# Patient Record
Sex: Female | Born: 1970 | Race: White | Hispanic: No | Smoking: Never smoker
Health system: Southern US, Community
[De-identification: ages and names within clinical notes are randomized; demographics above are authoritative.]

## PROBLEM LIST (undated history)

## (undated) DIAGNOSIS — E039 Hypothyroidism, unspecified: Secondary | ICD-10-CM

## (undated) DIAGNOSIS — I10 Essential (primary) hypertension: Secondary | ICD-10-CM

## (undated) DIAGNOSIS — M1990 Unspecified osteoarthritis, unspecified site: Secondary | ICD-10-CM

## (undated) DIAGNOSIS — U071 COVID-19: Secondary | ICD-10-CM

## (undated) DIAGNOSIS — E079 Disorder of thyroid, unspecified: Secondary | ICD-10-CM

## (undated) DIAGNOSIS — Z87442 Personal history of urinary calculi: Secondary | ICD-10-CM

## (undated) DIAGNOSIS — N200 Calculus of kidney: Secondary | ICD-10-CM

## (undated) DIAGNOSIS — N132 Hydronephrosis with renal and ureteral calculous obstruction: Secondary | ICD-10-CM

## (undated) HISTORY — PX: WRIST SURGERY: SHX841

## (undated) HISTORY — PX: CYSTOSCOPY: SUR368

## (undated) HISTORY — PX: FOOT SURGERY: SHX648

## (undated) HISTORY — PX: THYROIDECTOMY: SHX17

## (undated) HISTORY — PX: DILATION AND CURETTAGE OF UTERUS: SHX78

---
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
## 2007-05-25 ENCOUNTER — Emergency Department (HOSPITAL_COMMUNITY): Admission: EM | Admit: 2007-05-25 | Discharge: 2007-05-26 | Payer: Self-pay | Admitting: Emergency Medicine

## 2007-06-02 ENCOUNTER — Ambulatory Visit (HOSPITAL_BASED_OUTPATIENT_CLINIC_OR_DEPARTMENT_OTHER): Admission: RE | Admit: 2007-06-02 | Discharge: 2007-06-02 | Payer: Self-pay | Admitting: Urology

## 2009-12-25 ENCOUNTER — Ambulatory Visit (HOSPITAL_COMMUNITY): Admission: RE | Admit: 2009-12-25 | Discharge: 2009-12-25 | Payer: Self-pay | Admitting: Family Medicine

## 2010-01-12 ENCOUNTER — Ambulatory Visit (HOSPITAL_COMMUNITY): Admission: RE | Admit: 2010-01-12 | Discharge: 2010-01-12 | Payer: Self-pay | Admitting: General Surgery

## 2010-01-14 ENCOUNTER — Ambulatory Visit: Payer: Self-pay | Admitting: Orthopedic Surgery

## 2010-01-14 DIAGNOSIS — M25579 Pain in unspecified ankle and joints of unspecified foot: Secondary | ICD-10-CM | POA: Insufficient documentation

## 2010-01-16 ENCOUNTER — Encounter: Payer: Self-pay | Admitting: Orthopedic Surgery

## 2010-01-21 ENCOUNTER — Ambulatory Visit (HOSPITAL_COMMUNITY): Admission: RE | Admit: 2010-01-21 | Discharge: 2010-01-22 | Payer: Self-pay | Admitting: General Surgery

## 2010-01-21 ENCOUNTER — Encounter (INDEPENDENT_AMBULATORY_CARE_PROVIDER_SITE_OTHER): Payer: Self-pay | Admitting: General Surgery

## 2010-01-27 ENCOUNTER — Encounter
Admission: RE | Admit: 2010-01-27 | Discharge: 2010-04-27 | Payer: Self-pay | Source: Home / Self Care | Attending: Orthopedic Surgery | Admitting: Orthopedic Surgery

## 2010-02-10 ENCOUNTER — Encounter: Payer: Self-pay | Admitting: Orthopedic Surgery

## 2010-02-23 ENCOUNTER — Encounter: Payer: Self-pay | Admitting: Orthopedic Surgery

## 2010-02-24 ENCOUNTER — Ambulatory Visit: Payer: Self-pay | Admitting: Orthopedic Surgery

## 2010-02-24 DIAGNOSIS — S93409A Sprain of unspecified ligament of unspecified ankle, initial encounter: Secondary | ICD-10-CM | POA: Insufficient documentation

## 2010-02-25 ENCOUNTER — Encounter: Payer: Self-pay | Admitting: Orthopedic Surgery

## 2010-03-02 ENCOUNTER — Encounter: Payer: Self-pay | Admitting: Orthopedic Surgery

## 2010-03-04 ENCOUNTER — Ambulatory Visit (HOSPITAL_COMMUNITY)
Admission: RE | Admit: 2010-03-04 | Discharge: 2010-03-04 | Payer: Self-pay | Source: Home / Self Care | Admitting: Orthopedic Surgery

## 2010-03-12 ENCOUNTER — Ambulatory Visit: Payer: Self-pay | Admitting: Orthopedic Surgery

## 2010-03-16 ENCOUNTER — Telehealth: Payer: Self-pay | Admitting: Orthopedic Surgery

## 2010-03-18 ENCOUNTER — Telehealth: Payer: Self-pay | Admitting: Orthopedic Surgery

## 2010-04-13 ENCOUNTER — Encounter: Payer: Self-pay | Admitting: Orthopedic Surgery

## 2010-05-01 NOTE — H&P (Addendum)
  NAME:  Rebecca [name], Rebecca [name]                 ACCOUNT NO.:  [id number]  MEDICAL RECORD NO.:  [id number]          PATIENT TYPE:  AMB  LOCATION:  DAY                           FACILITY:  [facility]  PHYSICIAN:  Tilda Burrow, M.D. DATE OF BIRTH:  [birth year]  DATE OF ADMISSION: DATE OF DISCHARGE:  LH                             HISTORY & PHYSICAL   ADMISSION DIAGNOSIS:  Desire for elective permanent sterilization.  HISTORY OF PRESENT ILLNESS:  This 40 year old female, gravida 2, para 2, LMP mid January, divorced not sexually active at the present time, desires elective permanent sterilization.  She has been seen at [facility], counseled over desired permanent sterilization and we will proceed with tubal ligation.  On at [facility] May 08, 2010, she has been seen in the Health Department, referred to our office to permanent sterilization.  Consents have been signed March 27, 2011.  She understands the irreversibility of the intended permanency of the procedure with no reservations regarding proceeding with sterilization.  PAST MEDICAL HISTORY:  Positive for hypertension and hypothyroidism.  MEDICATIONS: 1. Synthroid 0.125 mg per day. 2. Lisinopril/hydrochlorothiazide 10/12.5 p.o. daily. 3. Micronor contraception daily.  SURGICAL HISTORY:  Thyroidectomy January 22, 2010, with thyroid cyst by Dr. Lovell Sheehan, cesarean section x2, breast surgeries x2 and tympanoplasties x2 in 2003.  PHYSICAL EXAMINATION:  VITAL SIGNS:  Height 5 feet 5 inches, weight 250. GENERAL:  Pupils equal, round and reactive.  Extraocular movements intact. NECK:  Supple.  Normal trachea midline, surgical scar consistent with thyroidectomy. CHEST:  Clear to auscultation. ABDOMEN:  Nontender without masses.  Obesity present. EXTERNAL GENITALIA:  Multiparous.  Uterus mobile, normal size with exam limited by body habitus. EXTREMITIES:  Without cyanosis, clubbing or edema.  ASSESSMENT: 1. Desire for elective  permanent sterilization. 2. History of cesarean section x2. 3. History of thyroidectomy. 4. Chronic hypertension. 5. Obesity.  PLAN:  Laparoscopic tubal sterilization with Falope rings at [facility] on May 08, 2010.     Tilda Burrow, M.D.     JVF/MEDQ  D:  04/29/2010  T:  04/30/2010  Job:  [id number]  cc:   [facility] Department  Electronically Signed by Christin Bach M.D. on 05/01/2010 11:44:28 AM

## 2010-05-05 NOTE — Assessment & Plan Note (Signed)
Summary: rt ankle pain needs xr ca mcd/rchd/bsf   Vital Signs:  [name] profile:   40 year old female Height:      66 inches Weight:      284 pounds Pulse rate:   64 / minute Resp:     16 per minute  Vitals Entered By: Fuller Canada MD (January 14, 2010 9:22 AM)  Visit Type:  Initial Consult Referring Jalan Fariss:  Health Deartment Primary Damarkus Balis:  Health Department  CC:  right ankle pain.  History of Present Illness: I saw Rebecca [name] in Rebecca office today for an initial visit.  Rebecca [name] is a 40 years old woman with Rebecca complaint of:  right ankle pain.  Xrays today.  DOI 01/10/10.  Meds: Birth Control.  This is a 40 year old female presents to [place] with several month history of medial RIGHT ankle pain after an eversion injury 3 months ago.  Rebecca [name] was walking in some high heels on a cobblestone street and Rebecca foot went out and Rebecca ankle rolled in and since that time Rebecca [name]'s had difficulty on Rebecca medial side of Rebecca ankle joint.  Rebecca [name] did wear a cam walker type boot for several weeks an initial pain subsided but Rebecca [name] continues to have swelling and pain with activity on Rebecca medial side at Rebecca antero medial corner of Rebecca joint.  Rebecca pain is 7/10 it is constant sharp and stabbing it is worse with exercise.  Rebecca [name] has recently been diagnosed with thyroid cancer and is scheduled for surgery.  Rebecca [name] is also trying to go back to school after splitting up with her husband.  Rebecca [name] has gained a significant amount of weight secondary to her thyroid disease and her most recent pregnancy and delivery.  Rebecca health department is asked [place] to be involved with consultation.  Rebecca [name] is a recreational runner and would like to pursue that again to lose weight.    Allergies (verified): No Known Drug Allergies  Past History:  Past Medical History: htn thyroid  Past Surgical History: 2 left wrist 2 c sections 2 ear  Family History: na  Social History: [name] is separated.  full time student no  smoking no alcohol nocaffeien associates degree  Review of Systems Constitutional:  Denies weight loss, weight gain, fever, chills, and fatigue. Cardiovascular:  Denies chest pain, palpitations, fainting, and murmurs. Respiratory:  Denies short of breath, wheezing, couch, tightness, pain on inspiration, and snoring . Gastrointestinal:  Denies heartburn, nausea, vomiting, diarrhea, constipation, and blood in your stools. Genitourinary:  Denies frequency, urgency, difficulty urinating, painful urination, flank pain, and bleeding in urine. Neurologic:  Denies numbness, tingling, unsteady gait, dizziness, tremors, and seizure. Musculoskeletal:  Denies joint pain, swelling, instability, stiffness, redness, heat, and muscle pain. Endocrine:  Denies excessive thirst, exessive urination, and heat or cold intolerance. Psychiatric:  Denies nervousness, depression, anxiety, and hallucinations. Skin:  Denies changes in Rebecca skin, poor healing, rash, itching, and redness. HEENT:  Denies blurred or double vision, eye pain, redness, and watering. Immunology:  Denies seasonal allergies, sinus problems, and allergic to bee stings. Hemoatologic:  Denies easy bleeding and brusing.  Physical Exam  Additional Exam:  Rather obese white female who has vital signs have been recorded in stable.  Rebecca [name] is otherwise well-developed and nourished woman hygiene or excellent  Rebecca [name] has no abnormalities on observation of her cardiovascular system related to her lower extremities and palpation of pulses are equal and normal.  Her skin is warm dry and intact no rash or lesion  Rebecca [name] has normal sensation in Rebecca RIGHT foot and ankle.  Rebecca [name] is awake alert and oriented x3 her mood and affect are normal  Rebecca [name] ambulates with what appears to be a normal gait.  Rebecca [name] has no tenderness in Rebecca ankle Achilles or heel area except at Rebecca anteromedial corner of Rebecca joint and over Rebecca talus and tibia distally.  Rebecca [name] has normal range of motion  good strength ankle is stable     Impression & Recommendations:  Problem # 1:  PAIN IN JOINT, ANKLE AND FOOT (ICD-719.47) Assessment New  x-rays were taken 3 views RIGHT ankle  I did not see any abnormality on x-ray.  Rebecca joint looks good there is no subluxation of Rebecca talus there is no evidence of chondral lesion or osteochondral fracture.  Impression ankle joint pain.  Rec  I would like to get an MRI of her ankle but Rebecca [name] has Medicaid and we will have to put on anti-inflammatories, start physical therapy first and then if no improvement after 6 weeks proceed  Orders: New [name] Level III (65784) Ankle x-ray complete,  minimum 3 views (69629)  [name] Instructions: 1)  PHYSICAL THERAPY x 6 weeks  2)  Over Rebecca counter NSAID Ibubrofen or Aleve  3)  return in 6 weeks

## 2010-05-05 NOTE — Letter (Signed)
Summary: History form  History form   Imported By: Jacklynn Ganong 01/16/2010 08:35:07  _____________________________________________________________________  External Attachment:    Type:   Image     Comment:   External Document

## 2010-05-05 NOTE — Assessment & Plan Note (Signed)
Summary: REVIEW MRI RESULTS/RT ANKLE/[facility]/[place] MEDICA[place]/CAF   Visit Type:  Follow-up Referring Provider:  Health Deartment Primary Provider:  Health Department  CC:  right ankle pain.  History of Present Illness: I saw Rebecca [name] in the office today for a followup visit.  She is a 40 years old woman with the complaint of:  right ankle  Patient states her ankle is the same.  MRI results: Her MRI is normal   MEDS: ALEVE  Previous treatments included bracing, physical therapy. She noted and physical therapy, that she had difficulty training on the proprioception work.  She still has significant pain in the front of the ankle with a clicking and popping which is preventing her from any extensive walking, and, definitely preventing her from running. She is very anxious to workout and to continue her exercise program, but is not able to because of ankle pain.      Allergies: No Known Drug Allergies   Impression & Recommendations:  Problem # 1:  ANKLE SPRAIN (ICD-845.00) Assessment Comment Only  The MRI was done at [facility] and it was reviewed with the report  IMPRESSION:   1.  No acute bony findings for significant degenerative changes. 2.  Mild edema and synovial thickening in the sinus tarsi. 3.  Intact medial and lateral ankle ligaments and tendons. 4.  Intact Achilles tendon, plantar fascia and anterior ankle tendons.   Read By:  Cyndie Chime,  M.D.      Orders: Orthopedic Surgeon Referral (Ortho Surgeon) Est. Patient Level II [phone])  Problem # 2:  PAIN IN JOINT, ANKLE AND FOOT (ICD-719.47) Assessment: Comment Only  We would advise her shoe start Aleve twice a day for a period of 6 weeks whether she is having discomfort or not.  We also advised a consult with the foot and ankle specialist  Orders: Orthopedic Surgeon Referral (Ortho Surgeon) Est. Patient Level II [phone])  Patient Instructions: 1)  2nd opinion foot and ankle specialist  2)  we will call you  and let you know of your appt. 3)  Take Aleve 1 tablet twice a day for 6 weeks   Orders Added: 1)  Orthopedic Surgeon Referral [Ortho Surgeon] 2)  Est. Patient Level II [14782]

## 2010-05-05 NOTE — Miscellaneous (Signed)
Summary: PT progress note  PT progress note   Imported By: Jacklynn Ganong 02/25/2010 07:53:47  _____________________________________________________________________  External Attachment:    Type:   Image     Comment:   External Document

## 2010-05-05 NOTE — Miscellaneous (Signed)
Summary: mri [facility] 03/04/10 reg 930am  Clinical Lists Changes  precert number [id number] expires 04/01/10 for [place] Medicaid, LMOM for pt to call here for mri result appt, needs to bring disc. 03/03/10 Called back to patient, reached, confirmed MRI appt for 03/04/10, 9:30 register, and scheduled follow up appointment.

## 2010-05-05 NOTE — Assessment & Plan Note (Signed)
Summary: 6 WK RE-CK RT ANKLE FOL'G PT/CA MCD/CAF   Visit Type:  Follow-up Referring Provider:  Health Deartment Primary Provider:  Health Department  CC:  recheck ankle.  History of Present Illness: This is a followup visit for this 40 year old female, who has had pain over the medial side of her RIGHT ankle over the medial malleolus after an inversion injury. Over 4 months ago. She was walking on [place]. Her foot went out from under her ankle rolled and since that, time, she's had medial ankle pain, which has not responded to physical therapy or anti-inflammatories. DOI 01/10/10.  Meds: Birth Control, Levothyroxine, Aleve some relief.  system review. Patient recently had her thyroid removed. That went well. She is doing well from that.  Today is 6 week recheck after PT, report for review and to be signed from 02/09/10.       Allergies (verified): No Known Drug Allergies  Past History:  Past Surgical History: 2 left wrist 2 c sections 2 ear thyroid removed 2011  Physical Exam  Additional Exam:  repeat examination.  She still has pain primarily over the medial malleolus and the soft tissues are nontender. There is some pain in the anterior ankle joint at the distal tibia. Her range of motion is normal. Her ankle remains stable. There is no muscle strength deficit. Sensation is normal and pulse and perfusion are also normal   Impression & Recommendations:  Problem # 1:  PAIN IN JOINT, ANKLE AND FOOT (ICD-719.47)  Orders: Est. Patient Level III (09604)  Problem # 2:  ANKLE SPRAIN (ICD-845.00) Assessment: Comment Only  Seems like he he has an anterior impingement lesion and perhaps a bone contusion of the medial malleolus., also could have talar dome osteochondral lesion.  Recommend MRI to evaluate the bone contusion versus osteochondral medial talar dome lesion  Orders: Est. Patient Level III (54098)  Patient Instructions: 1)  STOP PHYSICAL THERAPY    2)  MRI RIGHT ANKLE RETURN FOR RESULTS    Orders Added: 1)  Est. Patient Level III [11914]

## 2010-05-05 NOTE — Letter (Signed)
Summary: Insurance authorization for [employer] authorization for MRI   Imported By: Jacklynn Ganong 03/03/2010 08:39:46  _____________________________________________________________________  External Attachment:    Type:   Image     Comment:   External Document

## 2010-05-05 NOTE — Miscellaneous (Signed)
Summary: PT initial evaluation  PT initial evaluation   Imported By: Jacklynn Ganong 02/10/2010 17:00:32  _____________________________________________________________________  External Attachment:    Type:   Image     Comment:   External Document

## 2010-05-07 NOTE — Progress Notes (Signed)
Summary: Appointment scheduled w/Dr Lestine Box  Phone Note From Other Clinic   Caller: Referral Coordinator Summary of Call: Received fax from [facility]/Dr. Lestine Box - appointment has been scheduled 04/13/10 at 9:00AM.  I called patient to notify; states she was also notified by Dr. Lestine Box' referral person.  Patient already has films and aware to bring to appt. Initial call taken by: Cammie Sickle,  March 18, 2010 11:31 AM

## 2010-05-07 NOTE — Consult Note (Signed)
Summary: Consult report from Dr. Leonides Grills  Consult report from Dr. Leonides Grills   Imported By: Jacklynn Ganong 04/20/2010 13:52:47  _____________________________________________________________________  External Attachment:    Type:   Image     Comment:   External Document

## 2010-05-07 NOTE — Progress Notes (Signed)
Summary: Referral to Dr. Lestine Box.  Phone Note Outgoing Call   Call placed by: Waldon Reining,  March 16, 2010 10:03 AM Call placed to: Specialist Action Taken: Information Sent Summary of Call: I faxed a referral for this patient to Dr. Lestine Box to be seen for ankle sprain and pain in joint.

## 2010-05-08 ENCOUNTER — Ambulatory Visit (HOSPITAL_COMMUNITY)
Admission: RE | Admit: 2010-05-08 | Discharge: 2010-05-08 | Payer: Self-pay | Source: Home / Self Care | Attending: Obstetrics and Gynecology | Admitting: Obstetrics and Gynecology

## 2010-06-17 LAB — BASIC METABOLIC PANEL
CO2: 30 mEq/L (ref 19–32)
Chloride: 101 mEq/L (ref 96–112)
Glucose, Bld: 104 mg/dL — ABNORMAL HIGH (ref 70–99)
Potassium: 3.7 mEq/L (ref 3.5–5.1)
Sodium: 141 mEq/L (ref 135–145)

## 2010-06-17 LAB — CBC
HCT: 34.7 % — ABNORMAL LOW (ref 36.0–46.0)
Hemoglobin: 11.6 g/dL — ABNORMAL LOW (ref 12.0–15.0)
MCH: 27.4 pg (ref 26.0–34.0)
MCHC: 33.4 g/dL (ref 30.0–36.0)
MCV: 82 fL (ref 78.0–100.0)

## 2010-06-17 LAB — DIFFERENTIAL
Basophils Relative: 0 % (ref 0–1)
Eosinophils Absolute: 0.2 10*3/uL (ref 0.0–0.7)
Lymphs Abs: 3.1 10*3/uL (ref 0.7–4.0)
Monocytes Absolute: 1.3 10*3/uL — ABNORMAL HIGH (ref 0.1–1.0)
Monocytes Relative: 11 % (ref 3–12)

## 2010-06-17 LAB — ALBUMIN: Albumin: 3.4 g/dL — ABNORMAL LOW (ref 3.5–5.2)

## 2010-06-18 LAB — CBC
HCT: 40.1 % (ref 36.0–46.0)
Hemoglobin: 13.1 g/dL (ref 12.0–15.0)
MCH: 26.8 pg (ref 26.0–34.0)
MCHC: 32.8 g/dL (ref 30.0–36.0)
MCV: 81.9 fL (ref 78.0–100.0)
Platelets: 319 10*3/uL (ref 150–400)
RBC: 4.89 MIL/uL (ref 3.87–5.11)
RDW: 14.7 % (ref 11.5–15.5)
WBC: 8.9 10*3/uL (ref 4.0–10.5)

## 2010-06-18 LAB — BASIC METABOLIC PANEL
BUN: 10 mg/dL (ref 6–23)
CO2: 25 mEq/L (ref 19–32)
Calcium: 10.2 mg/dL (ref 8.4–10.5)
Chloride: 107 mEq/L (ref 96–112)
Creatinine, Ser: 0.86 mg/dL (ref 0.4–1.2)
GFR calc Af Amer: >60 mL/min (ref >60)
GFR calc non Af Amer: >60 mL/min (ref >60)
Glucose, Bld: 107 mg/dL — ABNORMAL HIGH (ref 70–99)
Potassium: 4 mEq/L (ref 3.5–5.1)
Sodium: 141 mEq/L (ref 135–145)

## 2010-06-18 LAB — SURGICAL PCR SCREEN
MRSA, PCR: NEGATIVE
Staphylococcus aureus: POSITIVE — AB

## 2010-06-18 LAB — TSH: TSH: 1.92 u[IU]/mL (ref 0.350–4.500)

## 2010-06-18 LAB — T4: T4, Total: 7 ug/dL (ref 5.0–12.5)

## 2010-08-18 NOTE — Op Note (Signed)
NAME:  Rebecca [name], Rebecca [name]                 ACCOUNT NO.:  [id number]   MEDICAL RECORD NO.:  [id number]          PATIENT TYPE:  AMB   LOCATION:  [facility]                         FACILITY:  [facility]   PHYSICIAN:  [name], M.D.DATE OF BIRTH:  June 22, 1970   DATE OF PROCEDURE:  06/02/2007  DATE OF DISCHARGE:                               OPERATIVE REPORT   PREOPERATIVE DIAGNOSIS:  Large, left distal ureteral stone measuring 9  mm.   POSTOPERATIVE DIAGNOSIS:  Large, left distal ureteral stone measuring 9  mm.   PROCEDURE:  1. Cystoscopy.  2. Left ureteroscopy.  3  Laser fragmentation and stone basketing.   SURGEON:  [name], MD   ANESTHESIA:  General.   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  She has  had previous stones including ureteroscopic procedure for 6 mm stone in  the past.  She was counseled about the risks of infection, hemorrhage,  or ureteral injury and possible double-J catheter.   PROCEDURE IN DETAIL:  The patient was brought to the operating room and  placed in lithotomy position.  External genitalia were prepped and  draped in the usual fashion.  She was cystoscoped and bladder was  unremarkable.  Fluoroscopy revealed a stone at the mid distal bony  pelvic ureter.  A guide wire was placed up the ureter and passed the  left ureteral stone without difficulty.  The inner sheath of an ureteral  access sheath was used to dilate the intramural ureter.  A 6-French  ureteroscope was then easily passed up to the stone, which was an  unusual whitish color.  I used the holmium laser to break the stone into  4 pieces, all of which were retrieved with the Nitinol stone basket and  later given to the patient.  At this point the ureteroscope was  reinserted and there was no evidence of any significant ureteral injury  that warranted placement of a double-J catheter.  Therefore the  bladder  was emptied, all scopes removed, and the patient taken to the recovery  room in good condition, having tolerated the procedure well.      [name], M.D.  Electronically Signed     LJP/MEDQ  D:  06/02/2007  T:  06/03/2007  Job:  [id number]

## 2010-12-28 LAB — BASIC METABOLIC PANEL
Calcium: 9
GFR calc Af Amer: 53 — ABNORMAL LOW
GFR calc non Af Amer: 44 — ABNORMAL LOW
Glucose, Bld: 120 — ABNORMAL HIGH
Potassium: 4.1
Sodium: 138

## 2010-12-28 LAB — PREGNANCY, URINE: Preg Test, Ur: NEGATIVE

## 2010-12-28 LAB — DIFFERENTIAL
Eosinophils Relative: 1
Lymphocytes Relative: 20
Lymphs Abs: 1.8
Monocytes Absolute: 1.1 — ABNORMAL HIGH
Monocytes Relative: 12
Neutro Abs: 6.2

## 2010-12-28 LAB — URINALYSIS, ROUTINE W REFLEX MICROSCOPIC
Ketones, ur: NEGATIVE
Nitrite: NEGATIVE
Protein, ur: 30 — AB

## 2010-12-28 LAB — CBC
HCT: 40.9
Hemoglobin: 14
RBC: 4.71
RDW: 12.6

## 2012-01-19 ENCOUNTER — Emergency Department (HOSPITAL_BASED_OUTPATIENT_CLINIC_OR_DEPARTMENT_OTHER): Payer: Medicaid Other

## 2012-01-19 ENCOUNTER — Encounter (HOSPITAL_BASED_OUTPATIENT_CLINIC_OR_DEPARTMENT_OTHER): Payer: Self-pay | Admitting: *Deleted

## 2012-01-19 ENCOUNTER — Emergency Department (HOSPITAL_BASED_OUTPATIENT_CLINIC_OR_DEPARTMENT_OTHER)
Admission: EM | Admit: 2012-01-19 | Discharge: 2012-01-19 | Disposition: A | Discharge status: Home / Self Care | Payer: Medicaid Other | Attending: Emergency Medicine | Admitting: Emergency Medicine

## 2012-01-19 DIAGNOSIS — X500XXA Overexertion from strenuous movement or load, initial encounter: Secondary | ICD-10-CM | POA: Insufficient documentation

## 2012-01-19 DIAGNOSIS — Y9301 Activity, walking, marching and hiking: Secondary | ICD-10-CM | POA: Insufficient documentation

## 2012-01-19 DIAGNOSIS — S93409A Sprain of unspecified ligament of unspecified ankle, initial encounter: Secondary | ICD-10-CM | POA: Insufficient documentation

## 2012-01-19 DIAGNOSIS — E079 Disorder of thyroid, unspecified: Secondary | ICD-10-CM | POA: Insufficient documentation

## 2012-01-19 DIAGNOSIS — S93402A Sprain of unspecified ligament of left ankle, initial encounter: Secondary | ICD-10-CM

## 2012-01-19 HISTORY — DX: Calculus of kidney: N20.0

## 2012-01-19 HISTORY — DX: Disorder of thyroid, unspecified: E07.9

## 2012-01-19 MED ORDER — OXYCODONE-ACETAMINOPHEN 5-325 MG PO TABS: 1.0000 | ORAL_TABLET | ORAL | Status: DC | PRN

## 2012-01-19 NOTE — ED Provider Notes (Signed)
History     CSN: [id number]  Arrival date & time 01/19/12  0865   First MD Initiated Contact with Patient 01/19/12 [phone]      Chief Complaint  Patient presents with  . Ankle Pain    (Consider location/radiation/quality/duration/timing/severity/associated sxs/prior treatment) Patient is a 41 y.o. Rebecca [name] presenting with ankle pain. The history is provided by the patient.  Ankle Pain   She twisted her left ankle yesterday while walking. It was an inversion injury. Since then, she complains of pain in the lateral aspect of her ankle. Pain is worse with walking. She rates pain at 3/10 at rest, and 7/10 with walking. She is able to bear weight although it is painful. She denies other injury.  Past Medical History  Diagnosis Date  . Thyroid disease   . Kidney stones     Past Surgical History  Procedure Date  . Thyroidectomy     History reviewed. No pertinent family history.  History  Substance Use Topics  . Smoking status: Not on file  . Smokeless tobacco: Not on file  . Alcohol Use: No    OB History    Grav Para Term Preterm Abortions TAB SAB Ect Mult Living                  Review of Systems  All other systems reviewed and are negative.    Allergies  Review of patient's allergies indicates no known allergies.  Home Medications   Current Outpatient Rx  Name Route Sig Dispense Refill  . LEVOTHYROXINE SODIUM 100 MCG PO TABS Oral Take 100 mcg by mouth daily.      BP 159/99  Pulse 85  Temp 97.8 F (36.6 C) (Oral)  Resp 16  Ht 5' 5.25" (1.657 m)  Wt 260 lb (117.935 kg)  BMI 42.94 kg/m2  SpO2 100%  Physical Exam  Nursing note and vitals reviewed. 41 year old Rebecca [name], resting comfortably and in no acute distress. Vital signs are significant for hypertension with blood pressure 159/99. Oxygen saturation is 100%, which is normal. Head is normocephalic and atraumatic. PERRLA, EOMI. Oropharynx is clear. Neck is nontender and supple without adenopathy or  JVD. Back is nontender and there is no CVA tenderness. Lungs are clear without rales, wheezes, or rhonchi. Chest is nontender. Heart has regular rate and rhythm without murmur. Abdomen is soft, flat, nontender without masses or hepatosplenomegaly and peristalsis is normoactive. Extremities have no cyanosis or edema, full range of motion is present. Left ankle has mild swelling anterolaterally, but no swelling over either malleolus. There is no tenderness to direct palpation. Pain is elicited with passive inversion. There is no tenderness over the fifth metatarsal and there is no tenderness over the proximal fibula. Distal neurovascular exam is intact with strong pulses, prompt capillary refill, and normal sensation. Skin is warm and dry without rash. Neurologic: Mental status is normal, cranial nerves are intact, there are no motor or sensory deficits.   ED Course  Procedures (including critical care time)  Dg Ankle Complete Left  01/19/2012  *RADIOLOGY REPORT*  Clinical Data: Twisted ankle, lateral pain  LEFT ANKLE COMPLETE - 3+ VIEW  Comparison: None.  Findings: Three views of the left ankle submitted.  No acute fracture or subluxation.  No radiopaque foreign body.  Ankle mortise is preserved.  IMPRESSION: No acute fracture or subluxation.  Ankle mortise is preserved.   Original Report Authenticated By: Natasha Mead, M.D.      1. Sprain of ankle, left  MDM  Ankle sprain of left ankle. X-rays will be obtained to rule out fracture.  X-rays are negative for fracture. She will be placed in an ASO to use as needed given crutches to use as needed. Prescriptions given for Percocet to take as needed for pain, but advised to use ibuprofen for less severe pain. She has an established relationship with Dr. Romeo Apple for orthopedic problems, so she is to follow up with him as needed.        Dione Booze, MD 01/19/12 1006

## 2012-01-19 NOTE — ED Notes (Signed)
Twisted ankle yesterday at grocery store having some swelling and pain in left ankle and side of foot

## 2012-05-31 ENCOUNTER — Ambulatory Visit: Payer: Medicaid Other | Admitting: Orthopedic Surgery

## 2012-06-05 ENCOUNTER — Ambulatory Visit: Payer: Medicaid Other | Admitting: Orthopedic Surgery

## 2012-06-06 ENCOUNTER — Encounter: Payer: Self-pay | Admitting: Orthopedic Surgery

## 2012-06-06 ENCOUNTER — Ambulatory Visit: Payer: Medicaid Other | Admitting: Orthopedic Surgery

## 2012-06-20 ENCOUNTER — Ambulatory Visit: Payer: Medicaid Other | Admitting: Orthopedic Surgery

## 2012-06-27 ENCOUNTER — Other Ambulatory Visit: Payer: Self-pay | Admitting: *Deleted

## 2012-06-27 ENCOUNTER — Ambulatory Visit (INDEPENDENT_AMBULATORY_CARE_PROVIDER_SITE_OTHER): Payer: Medicaid Other | Admitting: Orthopedic Surgery

## 2012-06-27 ENCOUNTER — Encounter: Payer: Self-pay | Admitting: Orthopedic Surgery

## 2012-06-27 VITALS — BP 150/88 | Ht 65.0 in | Wt 286.0 lb

## 2012-06-27 DIAGNOSIS — G5600 Carpal tunnel syndrome, unspecified upper limb: Secondary | ICD-10-CM | POA: Insufficient documentation

## 2012-06-27 DIAGNOSIS — G5601 Carpal tunnel syndrome, right upper limb: Secondary | ICD-10-CM

## 2012-06-27 NOTE — Progress Notes (Signed)
Patient ID: Rebecca [name], female   DOB: Jul 15, 1970, 42 y.o.   MRN: [id number] Chief Complaint  Patient presents with  . Hand Pain    Right hand and fingers pain, numbness, and tinling Referred by M. Rozann Lesches, NP    Patient history 42 year-old female presents with a history of hypothyroidism on thyroid medication, no history of diabetes, history of repetitive activity with computer use and 89 year history of carpal tunnellike symptoms in her long and ring finger. Over the last 2 years her symptoms have worsened. She has difficulty sleeping at night. She raises her arm and hand different positions to try to get the feeling back into them. In the mornings her hand is tight she feels numbness and tingling. With any writing or computer use she starts having increased numbness and tingling. She was treated with Neurontin, a brace and anti-inflammatory Aleve and her symptoms have not improved. She is interested in having carpal tunnel surgery.  She has no known drug allergies  Should thyroidectomy  She's had 2 cesarean sections 2 left wrist surgeries 2 tympanoplasty's and a total thyroidectomy  Pharmacy [facility] [address] [place]  [facility]  Family history is negative  Social history divorced  Current employment Loss adjuster, chartered. No smoking. She has a Probation officer.  BP 150/88  Ht 5\' 5"  (1.651 m)  Wt 286 lb (129.729 kg)  BMI 47.59 kg/m2 General appearance is normal, the patient is alert and oriented x3 with normal mood and affect. Endomorphic body habitus BMI 47 Ambulation is normal  Her right hand appears to be swollen compared to the left. Her range of motion is normal at the wrist and small joints of the hand including the fingers. The wrist joint itself is stable her grip strength is normal. Skin is intact without rash. Pulses and perfusion are normal radial ulnar artery with normal Allen's test normal capillary refill. Decreased sensation in the  ring and long finger. Positive carpal tunnel findings include immediate findings on the Phalen's maneuver and tenderness with the carpal tunnel compression test and pain and tenderness in the distal half of the forearm on the volar side  Impression carpal tunnel syndrome right upper extremity  Plan carpal tunnel release.  The risks and benefits of the procedure have been explained to the patient and patient information has been given.  We'll schedule surgery at her earliest convenience

## 2012-06-27 NOTE — Patient Instructions (Addendum)
Right carpal tunnel release   Carpal Tunnel Release Carpal tunnel release is done to relieve the pressure on the nerves and tendons on the bottom side of your wrist.  LET YOUR CAREGIVER KNOW ABOUT:   Allergies to food or medicine.  Medicines taken, including vitamins, herbs, eyedrops, over-the-counter medicines, and creams.  Use of steroids (by mouth or creams).  Previous problems with anesthetics or numbing medicines.  History of bleeding problems or blood clots.  Previous surgery.  Other health problems, including diabetes and kidney problems.  Possibility of pregnancy, if this applies. RISKS AND COMPLICATIONS  Some problems that may happen after this procedure include:  Infection.  Damage to the nerves, arteries or tendons could occur. This would be very uncommon.  Bleeding. BEFORE THE PROCEDURE   This surgery may be done while you are asleep (general anesthetic) or may be done under a block where only your forearm and the surgical area is numb.  If the surgery is done under a block, the numbness will gradually wear off within several hours after surgery. HOME CARE INSTRUCTIONS   Have a responsible person with you for 24 hours.  Do not drive a car or use public transportation for 24 hours.  Only take over-the-counter or prescription medicines for pain, discomfort, or fever as directed by your caregiver. Take them as directed.  You may put ice on the palm side of the affected wrist.  Put ice in a plastic bag.  Place a towel between your skin and the bag.  Leave the ice on for 20 to 30 minutes, 4 times per day.  If you were given a splint to keep your wrist from bending, use it as directed. It is important to wear the splint at night or as directed. Use the splint for as long as you have pain or numbness in your hand, arm, or wrist. This may take 1 to 2 months.  Keep your hand raised (elevated) above the level of your heart as much as possible. This keeps swelling  down and helps with discomfort.  Change bandages (dressings) as directed.  Keep the wound clean and dry. SEEK MEDICAL CARE IF:   You develop pain not relieved with medications.  You develop numbness of your hand.  You develop bleeding from your surgical site.  You have an oral temperature above 102 F (38.9 C).  You develop redness or swelling of the surgical site.  You develop new, unexplained problems. SEEK IMMEDIATE MEDICAL CARE IF:   You develop a rash.  You have difficulty breathing.  You develop any reaction or side effects to medications given. Document Released: 06/12/2003 Document Revised: 06/14/2011 Document Reviewed: 01/26/2007 [facility] Patient Information 2013 [place], [place].

## 2012-07-13 ENCOUNTER — Encounter (HOSPITAL_COMMUNITY): Payer: Self-pay | Admitting: Pharmacy Technician

## 2012-07-17 ENCOUNTER — Telehealth: Payer: Self-pay | Admitting: *Deleted

## 2012-07-17 ENCOUNTER — Encounter (HOSPITAL_COMMUNITY)
Admission: RE | Admit: 2012-07-17 | Discharge: 2012-07-17 | Disposition: A | Discharge status: Home / Self Care | Payer: Medicaid Other | Source: Ambulatory Visit | Attending: Orthopedic Surgery | Admitting: Orthopedic Surgery

## 2012-07-17 ENCOUNTER — Encounter (HOSPITAL_COMMUNITY): Payer: Self-pay

## 2012-07-17 HISTORY — DX: Hypothyroidism, unspecified: E03.9

## 2012-07-17 HISTORY — DX: Essential (primary) hypertension: I10

## 2012-07-17 LAB — BASIC METABOLIC PANEL
Calcium: 9.1 mg/dL (ref 8.4–10.5)
Creatinine, Ser: 0.92 mg/dL (ref 0.50–1.10)
GFR calc non Af Amer: 77 mL/min — ABNORMAL LOW (ref >90)
Glucose, Bld: 73 mg/dL (ref 70–99)
Sodium: 139 mEq/L (ref 135–145)

## 2012-07-17 LAB — HEMOGLOBIN AND HEMATOCRIT, BLOOD: Hemoglobin: 15.1 g/dL — ABNORMAL HIGH (ref 12.0–15.0)

## 2012-07-17 LAB — HCG, SERUM, QUALITATIVE: Preg, Serum: NEGATIVE

## 2012-07-17 LAB — SURGICAL PCR SCREEN: Staphylococcus aureus: NEGATIVE

## 2012-07-17 NOTE — Patient Instructions (Addendum)
Rebecca [name]  07/17/2012   Your procedure is scheduled on:  07/24/2012  Report to [facility] at  0845  AM.  Call this number if you have problems the morning of surgery: [phone]   Remember:   Do not eat food or drink liquids after midnight.   Take these medicines the morning of surgery with A SIP OF WATER: Synthroid   Do not wear jewelry, make-up or nail polish.  Do not wear lotions, powders, or perfumes.   Do not shave 48 hours prior to surgery. Men may shave face and neck.  Do not bring valuables to the hospital.  Contacts, dentures or bridgework may not be worn into surgery.  Leave suitcase in the car. After surgery it may be brought to your room.  For patients admitted to the hospital, checkout time is 11:00 AM the day of discharge.   Patients discharged the day of surgery will not be allowed to drive  home.  Name and phone number of your driver: family  Special Instructions: Shower using CHG 2 nights before surgery and the night before surgery.  If you shower the day of surgery use CHG.  Use special wash - you have one bottle of CHG for all showers.  You should use approximately 1/3 of the bottle for each shower.   Please read over the following fact sheets that you were given: Pain Booklet, Coughing and Deep Breathing, MRSA Information, Surgical Site Infection Prevention, Anesthesia Post-op Instructions and Care and Recovery After Surgery Carpal Tunnel Release Carpal tunnel release is done to relieve the pressure on the nerves and tendons on the bottom side of your wrist.  LET YOUR CAREGIVER KNOW ABOUT:   Allergies to food or medicine.  Medicines taken, including vitamins, herbs, eyedrops, over-the-counter medicines, and creams.  Use of steroids (by mouth or creams).  Previous problems with anesthetics or numbing medicines.  History of bleeding problems or blood clots.  Previous surgery.  Other health problems, including diabetes and kidney  problems.  Possibility of pregnancy, if this applies. RISKS AND COMPLICATIONS  Some problems that may happen after this procedure include:  Infection.  Damage to the nerves, arteries or tendons could occur. This would be very uncommon.  Bleeding. BEFORE THE PROCEDURE   This surgery may be done while you are asleep (general anesthetic) or may be done under a block where only your forearm and the surgical area is numb.  If the surgery is done under a block, the numbness will gradually wear off within several hours after surgery. HOME CARE INSTRUCTIONS   Have a responsible person with you for 24 hours.  Do not drive a car or use public transportation for 24 hours.  Only take over-the-counter or prescription medicines for pain, discomfort, or fever as directed by your caregiver. Take them as directed.  You may put ice on the palm side of the affected wrist.  Put ice in a plastic bag.  Place a towel between your skin and the bag.  Leave the ice on for 20 to 30 minutes, 4 times per day.  If you were given a splint to keep your wrist from bending, use it as directed. It is important to wear the splint at night or as directed. Use the splint for as long as you have pain or numbness in your hand, arm, or wrist. This may take 1 to 2 months.  Keep your hand raised (elevated) above the level of your heart as  much as possible. This keeps swelling down and helps with discomfort.  Change bandages (dressings) as directed.  Keep the wound clean and dry. SEEK MEDICAL CARE IF:   You develop pain not relieved with medications.  You develop numbness of your hand.  You develop bleeding from your surgical site.  You have an oral temperature above 102 F (38.9 C).  You develop redness or swelling of the surgical site.  You develop new, unexplained problems. SEEK IMMEDIATE MEDICAL CARE IF:   You develop a rash.  You have difficulty breathing.  You develop any reaction or side  effects to medications given. Document Released: 06/12/2003 Document Revised: 06/14/2011 Document Reviewed: 01/26/2007 [facility] Patient Information 2013 [place], [place]. PATIENT INSTRUCTIONS POST-ANESTHESIA  IMMEDIATELY FOLLOWING SURGERY:  Do not drive or operate machinery for the first twenty four hours after surgery.  Do not make any important decisions for twenty four hours after surgery or while taking narcotic pain medications or sedatives.  If you develop intractable nausea and vomiting or a severe headache please notify your doctor immediately.  FOLLOW-UP:  Please make an appointment with your surgeon as instructed. You do not need to follow up with anesthesia unless specifically instructed to do so.  WOUND CARE INSTRUCTIONS (if applicable):  Keep a dry clean dressing on the anesthesia/puncture wound site if there is drainage.  Once the wound has quit draining you may leave it open to air.  Generally you should leave the bandage intact for twenty four hours unless there is drainage.  If the epidural site drains for more than 36-48 hours please call the anesthesia department.  QUESTIONS?:  Please feel free to call your physician or the hospital operator if you have any questions, and they will be happy to assist you.

## 2012-07-17 NOTE — Telephone Encounter (Signed)
Ok but check with [facility] that she has been seen

## 2012-07-17 NOTE — Telephone Encounter (Signed)
Kim, from day surgery, called and said patient was there for PAT and her blood pressure was 173/107. Patient told Selena Batten she has been having frequent headaches and blood pressure has been running 170/98 to 170/100. Dr. Jayme Cloud recommended patient see family doctor and get on blood pressure medicine prior to surgery Monday. Patient was made appointment at [facility] for today at 3:45pm. Selena Batten stated Dr. Jayme Cloud said surgery is still on go for Monday as Lat[place]ia [place] as patient's blood pressure is good when she comes in Monday.

## 2012-07-17 NOTE — Pre-Procedure Instructions (Signed)
Patient in for PAT. States she has been having headaches lately. Has seen a new PCP and BP been running 160-170 over 98-100. Was not put on any meds for BP with last visit. BP now 173/107 and stated that she has a headache today also. Dr Jayme Cloud notified of above information and wamts patient on BP meds prior to surgery. I called [facility] [phone] and spoke with PJ. Told her all above information and they are going to see patient today, 07/17/2012 at 1545 and evaluate her and put her on meds as needed. Spoke with Tammy Long,LPN at Dr Harrison's office to notify her of all above and she will let Dr Romeo Apple know that as long as pt is on meds and BP down on Monday,surgery will proceed as planned.

## 2012-07-18 ENCOUNTER — Telehealth: Payer: Self-pay | Admitting: Orthopedic Surgery

## 2012-07-18 ENCOUNTER — Encounter (HOSPITAL_COMMUNITY): Payer: Self-pay | Admitting: Pharmacy Technician

## 2012-07-18 NOTE — Pre-Procedure Instructions (Addendum)
Called patient to see if she was able to keep her appointment at PCP yesterday. She states she was and was started on Metoprolol 50 mg daily. Instructed her to take this with a sip of H2O am of surgery. Patient verbalized understanding of this. Called Pharmacy tech and alerted them of this so it could be paced on patients med rec.

## 2012-07-18 NOTE — Telephone Encounter (Signed)
Contacted insurer, Medicaid, regarding out-patient surgery scheduled 07/28/12 at [facility], CPT [phone].  Per [employer] [place] [employer] online system, no prior authorization is required. 07/18/12 3:20p.m.

## 2012-07-18 NOTE — Telephone Encounter (Signed)
Verified patient was seen at [facility]. I ask them to fax Dr. Romeo Apple the office notes.

## 2012-07-22 NOTE — H&P (Addendum)
  Chief Complaint   Patient presents with   .  Hand Pain     Right hand and fingers pain, numbness, and tinling Referred by M. Rozann Lesches, NP   Patient history 42 year-old female presents with a history of hypothyroidism on thyroid medication, no history of diabetes, history of repetitive activity with computer use and 8-9 year history of carpal tunnellike symptoms in her long and ring finger. Over the last 2 years her symptoms have worsened. She has difficulty sleeping at night. She raises her arm and hand different positions to try to get the feeling back into them. In the mornings her hand is tight she feels numbness and tingling. With any writing or computer use she starts having increased numbness and tingling. She was treated with Neurontin, a brace and anti-inflammatory Aleve and her symptoms have not improved. She is interested in having carpal tunnel surgery.   Review of Systems  Constitutional: Positive for fever.  Eyes: Negative for blurred vision.  Respiratory: Negative for cough.   Cardiovascular: Negative for chest pain.  Gastrointestinal: Negative for heartburn.  Genitourinary: Negative for dysuria.  Musculoskeletal: Negative for myalgias.  Skin: Negative for rash.  Neurological: Negative for dizziness and headaches.  Endo/Heme/Allergies: Does not bruise/bleed easily.  Psychiatric/Behavioral: Negative for depression.    She has no known drug allergies   s/p thyroidectomy   She's had 2 cesarean sections  2 left wrist surgeries  2 tympanoplasty's and a total thyroidectomy   Pharmacy [facility] [address] [place]  [facility]    Family history is negative   Social history divorced   Current employment Loss adjuster, [employer]. No smoking. She has a Probation officer.   BP 150/88  Ht 5\' 5"  (1.651 m)  Wt 286 lb (129.729 kg)  BMI 47.59 kg/m2  General appearance is normal, the patient is alert and oriented x3 with normal mood and affect.   Endomorphic body habitus BMI 47  Ambulation is normal   Lower extremity exam 17-Nov-1970 is the patients date of birth    Ambulation is normal.  The right and left lower extremity:  Inspection and palpation revealed no tenderness or abnormality in alignment in the lower extremities. Range of motion is full.  Strength is grade 5.  All joints are stable. Left upper extremity is normal   Her right hand appears to be swollen compared to the left. Her range of motion is normal at the wrist and small joints of the hand including the fingers. The wrist joint itself is stable her grip strength is normal. Skin is intact without rash. Pulses and perfusion are normal radial ulnar artery with normal Allen's test normal capillary refill. Decreased sensation in the ring and long finger. Positive carpal tunnel findings include immediate findings on the Phalen's maneuver and tenderness with the carpal tunnel compression test and pain and tenderness in the distal half of the forearm on the volar side   Impression carpal tunnel syndrome right upper extremity  Plan right carpal tunnel release.  The risks and benefits of the procedure have been explained to the patient and patient information has been given.  We'll schedule surgery at her earliest convenience

## 2012-07-24 ENCOUNTER — Encounter (HOSPITAL_COMMUNITY)
Admission: RE | Disposition: A | Discharge status: Home / Self Care | Payer: Self-pay | Source: Ambulatory Visit | Attending: Orthopedic Surgery

## 2012-07-24 ENCOUNTER — Encounter (HOSPITAL_COMMUNITY): Payer: Self-pay | Admitting: Anesthesiology

## 2012-07-24 ENCOUNTER — Encounter: Payer: Self-pay | Admitting: Orthopedic Surgery

## 2012-07-24 ENCOUNTER — Ambulatory Visit (HOSPITAL_COMMUNITY)
Admission: RE | Admit: 2012-07-24 | Discharge: 2012-07-24 | Disposition: A | Discharge status: Home / Self Care | Payer: Medicaid Other | Source: Ambulatory Visit | Attending: Orthopedic Surgery | Admitting: Orthopedic Surgery

## 2012-07-24 ENCOUNTER — Ambulatory Visit (HOSPITAL_COMMUNITY): Payer: Medicaid Other | Admitting: Anesthesiology

## 2012-07-24 ENCOUNTER — Encounter (HOSPITAL_COMMUNITY): Payer: Self-pay | Admitting: *Deleted

## 2012-07-24 DIAGNOSIS — G5600 Carpal tunnel syndrome, unspecified upper limb: Secondary | ICD-10-CM

## 2012-07-24 DIAGNOSIS — Z01810 Encounter for preprocedural cardiovascular examination: Secondary | ICD-10-CM | POA: Insufficient documentation

## 2012-07-24 DIAGNOSIS — G5601 Carpal tunnel syndrome, right upper limb: Secondary | ICD-10-CM

## 2012-07-24 DIAGNOSIS — Z6841 Body Mass Index (BMI) 40.0 and over, adult: Secondary | ICD-10-CM | POA: Insufficient documentation

## 2012-07-24 DIAGNOSIS — Z01812 Encounter for preprocedural laboratory examination: Secondary | ICD-10-CM | POA: Insufficient documentation

## 2012-07-24 HISTORY — PX: CARPAL TUNNEL RELEASE: SHX101

## 2012-07-24 SURGERY — CARPAL TUNNEL RELEASE
Anesthesia: Regional | Site: Hand | Laterality: Right | Wound class: Clean

## 2012-07-24 MED ORDER — BUPIVACAINE HCL (PF) 0.5 % IJ SOLN
INTRAMUSCULAR | Status: AC
  Filled 2012-07-24: qty 30

## 2012-07-24 MED ORDER — PROPOFOL 10 MG/ML IV EMUL
INTRAVENOUS | Status: AC
  Filled 2012-07-24: qty 20

## 2012-07-24 MED ORDER — MIDAZOLAM HCL 2 MG/2ML IJ SOLN
INTRAMUSCULAR | Status: AC
  Filled 2012-07-24: qty 2

## 2012-07-24 MED ORDER — LACTATED RINGERS IV SOLN: INTRAVENOUS | Status: DC

## 2012-07-24 MED ORDER — LACTATED RINGERS IV SOLN
INTRAVENOUS | Status: DC
  Administered 2012-07-24: 10:00:00 via INTRAVENOUS

## 2012-07-24 MED ORDER — FENTANYL CITRATE 0.05 MG/ML IJ SOLN
INTRAMUSCULAR | Status: DC | PRN
  Administered 2012-07-24: 50 ug via INTRAVENOUS
  Administered 2012-07-24: 25 ug via INTRAVENOUS

## 2012-07-24 MED ORDER — DEXTROSE 5 % IV SOLN: 3.0000 g | INTRAVENOUS | Status: DC

## 2012-07-24 MED ORDER — SODIUM CHLORIDE 0.9 % IR SOLN
Status: DC | PRN
  Administered 2012-07-24: 1000 mL

## 2012-07-24 MED ORDER — ONDANSETRON HCL 4 MG/2ML IJ SOLN: 4.0000 mg | Freq: Once | INTRAMUSCULAR | Status: DC | PRN

## 2012-07-24 MED ORDER — CEFAZOLIN SODIUM 1-5 GM-% IV SOLN
INTRAVENOUS | Status: AC
  Filled 2012-07-24: qty 50

## 2012-07-24 MED ORDER — FENTANYL CITRATE 0.05 MG/ML IJ SOLN
INTRAMUSCULAR | Status: AC
  Filled 2012-07-24: qty 2

## 2012-07-24 MED ORDER — CHLORHEXIDINE GLUCONATE 4 % EX LIQD: 60.0000 mL | Freq: Once | CUTANEOUS | Status: DC

## 2012-07-24 MED ORDER — HYDROCODONE-ACETAMINOPHEN 7.5-325 MG PO TABS: 1.0000 | ORAL_TABLET | ORAL | Status: DC | PRN

## 2012-07-24 MED ORDER — BUPIVACAINE HCL (PF) 0.5 % IJ SOLN
INTRAMUSCULAR | Status: DC | PRN
  Administered 2012-07-24: 15 mL

## 2012-07-24 MED ORDER — FENTANYL CITRATE 0.05 MG/ML IJ SOLN
25.0000 ug | INTRAMUSCULAR | Status: DC | PRN
  Administered 2012-07-24: 25 ug via INTRAVENOUS

## 2012-07-24 MED ORDER — FENTANYL CITRATE 0.05 MG/ML IJ SOLN: 25.0000 ug | INTRAMUSCULAR | Status: DC | PRN

## 2012-07-24 MED ORDER — CEFAZOLIN SODIUM-DEXTROSE 2-3 GM-% IV SOLR
2.0000 g | INTRAVENOUS | Status: AC
  Administered 2012-07-24: 2 g via INTRAVENOUS

## 2012-07-24 MED ORDER — CEFAZOLIN SODIUM 1-5 GM-% IV SOLN
1.0000 g | INTRAVENOUS | Status: AC
  Administered 2012-07-24: 1 g via INTRAVENOUS

## 2012-07-24 MED ORDER — LIDOCAINE HCL (PF) 0.5 % IJ SOLN
INTRAMUSCULAR | Status: DC | PRN
  Administered 2012-07-24: 50 mL via INTRAVENOUS

## 2012-07-24 MED ORDER — PROPOFOL INFUSION 10 MG/ML OPTIME
INTRAVENOUS | Status: DC | PRN
  Administered 2012-07-24: 35 ug/kg/min via INTRAVENOUS

## 2012-07-24 MED ORDER — MIDAZOLAM HCL 5 MG/5ML IJ SOLN
INTRAMUSCULAR | Status: DC | PRN
  Administered 2012-07-24: 2 mg via INTRAVENOUS

## 2012-07-24 MED ORDER — MIDAZOLAM HCL 2 MG/2ML IJ SOLN
1.0000 mg | INTRAMUSCULAR | Status: DC | PRN
  Administered 2012-07-24: 2 mg via INTRAVENOUS

## 2012-07-24 MED ORDER — CEFAZOLIN SODIUM-DEXTROSE 2-3 GM-% IV SOLR
INTRAVENOUS | Status: AC
  Filled 2012-07-24: qty 50

## 2012-07-24 SURGICAL SUPPLY — 37 items
BAG HAMPER (MISCELLANEOUS) ×2 IMPLANT
BANDAGE ELASTIC 3 VELCRO NS (GAUZE/BANDAGES/DRESSINGS) ×2 IMPLANT
BANDAGE ESMARK 4X12 BL STRL LF (DISPOSABLE) ×1 IMPLANT
BANDAGE GAUZE ELAST BULKY 4 IN (GAUZE/BANDAGES/DRESSINGS) ×2 IMPLANT
BNDG COHESIVE 4X5 TAN NS LF (GAUZE/BANDAGES/DRESSINGS) ×2 IMPLANT
BNDG ESMARK 4X12 BLUE STRL LF (DISPOSABLE) ×2
CHLORAPREP W/TINT 26ML (MISCELLANEOUS) ×2 IMPLANT
CLOTH BEACON ORANGE TIMEOUT ST (SAFETY) ×2 IMPLANT
COVER LIGHT HANDLE STERIS (MISCELLANEOUS) ×4 IMPLANT
CUFF TOURNIQUET SINGLE 18IN (TOURNIQUET CUFF) ×2 IMPLANT
DECANTER SPIKE VIAL GLASS SM (MISCELLANEOUS) ×2 IMPLANT
DRAPE PROXIMA HALF (DRAPES) ×2 IMPLANT
DRSG XEROFORM 1X8 (GAUZE/BANDAGES/DRESSINGS) ×2 IMPLANT
ELECT NEEDLE TIP 2.8 STRL (NEEDLE) ×2 IMPLANT
ELECT REM PT RETURN 9FT ADLT (ELECTROSURGICAL) ×2
ELECTRODE REM PT RTRN 9FT ADLT (ELECTROSURGICAL) ×1 IMPLANT
GLOVE BIOGEL PI IND STRL 7.0 (GLOVE) ×2 IMPLANT
GLOVE BIOGEL PI INDICATOR 7.0 (GLOVE) ×2
GLOVE ECLIPSE 7.0 STRL STRAW (GLOVE) ×2 IMPLANT
GLOVE EXAM NITRILE MD LF STRL (GLOVE) ×2 IMPLANT
GLOVE SKINSENSE NS SZ8.0 LF (GLOVE) ×1
GLOVE SKINSENSE STRL SZ8.0 LF (GLOVE) ×1 IMPLANT
GLOVE SS BIOGEL STRL SZ 6.5 (GLOVE) ×1 IMPLANT
GLOVE SS N UNI LF 8.5 STRL (GLOVE) ×2 IMPLANT
GLOVE SUPERSENSE BIOGEL SZ 6.5 (GLOVE) ×1
GOWN STRL REIN XL XLG (GOWN DISPOSABLE) ×6 IMPLANT
HAND ALUMI XLG (SOFTGOODS) ×2 IMPLANT
KIT ROOM TURNOVER APOR (KITS) ×2 IMPLANT
MANIFOLD NEPTUNE II (INSTRUMENTS) ×2 IMPLANT
NEEDLE HYPO 21X1.5 SAFETY (NEEDLE) ×2 IMPLANT
NS IRRIG 1000ML POUR BTL (IV SOLUTION) ×2 IMPLANT
PACK BASIC LIMB (CUSTOM PROCEDURE TRAY) ×2 IMPLANT
PAD ARMBOARD 7.5X6 YLW CONV (MISCELLANEOUS) ×2 IMPLANT
SET BASIN LINEN APH (SET/KITS/TRAYS/PACK) ×2 IMPLANT
SPONGE GAUZE 4X4 12PLY (GAUZE/BANDAGES/DRESSINGS) ×2 IMPLANT
SUT ETHILON 3 0 FSL (SUTURE) ×2 IMPLANT
SYR CONTROL 10ML LL (SYRINGE) ×2 IMPLANT

## 2012-07-24 NOTE — Transfer of Care (Signed)
Immediate Anesthesia Transfer of Care Note  Patient: Rebecca [name]  Procedure(s) Performed: Procedure(s) with comments: CARPAL TUNNEL RELEASE (Right) - Right Carpal Tunnel Release  Patient Location: PACU  Anesthesia Type:Bier block  Level of Consciousness: awake, alert  and oriented  Airway & Oxygen Therapy: Patient Spontanous Breathing and Patient connected to face mask oxygen  Post-op Assessment: Report given to PACU RN  Post vital signs: Reviewed and stable  Complications: No apparent anesthesia complications

## 2012-07-24 NOTE — Brief Op Note (Addendum)
07/24/2012  11:32 AM  PATIENT:  Rebecca [name]  42 y.o. female  PRE-OPERATIVE DIAGNOSIS:  Right Carpal tunnel syndrome  POST-OPERATIVE DIAGNOSIS:  Right Carpal tunnel syndrome  PROCEDURE:  Procedure(s) with comments: CARPAL TUNNEL RELEASE (Right) - Right Carpal Tunnel Release  FINDINGS: STENOTIC CARPAL TUNNEL  SURGEON:  Surgeon(s) and Role:    * Vickki Hearing, MD - Primary  PHYSICIAN ASSISTANT:   ASSISTANTS: CATHERINE PAGE    ANESTHESIA:   regional  EBL:  Total I/O In: 300 [I.V.:300] Out: -   BLOOD ADMINISTERED:none  DRAINS: none   LOCAL MEDICATIONS USED:  MARCAINE    and Amount: 10 ml  SPECIMEN:  No Specimen  DISPOSITION OF SPECIMEN:  N/A  COUNTS:  YES  TOURNIQUET:   Total Tourniquet Time Documented: Upper Arm (Right) - 34 minutes Total: Upper Arm (Right) - 34 minutes   DICTATION: .Reubin Milan Dictation  PLAN OF CARE: DISCHARGE   PATIENT DISPOSITION:  PACU - hemodynamically stable.   Delay start of Pharmacological VTE agent (>24hrs) due to surgical blood loss or risk of bleeding: not applicable

## 2012-07-24 NOTE — Anesthesia Preprocedure Evaluation (Signed)
Anesthesia Evaluation  Patient identified by MRN, date of birth, ID band Patient awake    Reviewed: Allergy & Precautions, H&P , NPO status , Patient's Chart, lab work & pertinent test results  History of Anesthesia Complications Negative for: history of anesthetic complications  Airway Mallampati: II TM Distance: >3 FB     Dental  (+) Teeth Intact   Pulmonary  breath sounds clear to auscultation        Cardiovascular hypertension, Pt. on medications Rhythm:Regular Rate:Normal     Neuro/Psych    GI/Hepatic   Endo/Other  Hypothyroidism   Renal/GU      Musculoskeletal   Abdominal   Peds  Hematology   Anesthesia Other Findings   Reproductive/Obstetrics                           Anesthesia Physical Anesthesia Plan  ASA: II  Anesthesia Plan: Bier Block   Post-op Pain Management:    Induction: Intravenous  Airway Management Planned: Nasal Cannula  Additional Equipment:   Intra-op Plan:   Post-operative Plan:   Informed Consent: I have reviewed the patients History and Physical, chart, labs and discussed the procedure including the risks, benefits and alternatives for the proposed anesthesia with the patient or authorized representative who has indicated his/her understanding and acceptance.     Plan Discussed with:   Anesthesia Plan Comments:         Anesthesia Quick Evaluation

## 2012-07-24 NOTE — Interval H&P Note (Signed)
History and Physical Interval Note:  07/24/2012 10:37 AM  Rebecca [name]  has presented today for surgery, with the diagnosis of Carpal tunnel syndrome (right)  The various methods of treatment have been discussed with the patient and family. After consideration of risks, benefits and other options for treatment, the patient has consented to  Procedure(s): CARPAL TUNNEL RELEASE (Right) as a surgical intervention .  The patient's history has been reviewed, patient examined, no change in status, stable for surgery.  I have reviewed the patient's chart and labs.  Questions were answered to the patient's satisfaction.     Fuller Canada

## 2012-07-24 NOTE — Anesthesia Postprocedure Evaluation (Signed)
  Anesthesia Post-op Note  Patient: Rebecca [name]  Procedure(s) Performed: Procedure(s) with comments: CARPAL TUNNEL RELEASE (Right) - Right Carpal Tunnel Release  Patient Location: PACU  Anesthesia Type:Bier block  Level of Consciousness: awake, alert  and oriented  Airway and Oxygen Therapy: Patient Spontanous Breathing  Post-op Pain: none  Post-op Assessment: Post-op Vital signs reviewed, Patient's Cardiovascular Status Stable, Respiratory Function Stable, Patent Airway and No signs of Nausea or vomiting  Post-op Vital Signs: Reviewed and stable  Complications: No apparent anesthesia complications

## 2012-07-24 NOTE — Anesthesia Procedure Notes (Signed)
Anesthesia Regional Block:  Bier block (IV Regional)  Pre-Anesthetic Checklist: ,, timeout performed, Correct Patient, Correct Site, Correct Laterality, Correct Procedure,, site marked, surgical consent,, at surgeon's request Needles:  Injection technique: Single-shot  Needle Type: Other      Needle Gauge: 22 and 22 G    Additional Needles: Bier block (IV Regional)  Nerve Stimulator or Paresthesia:   Additional Responses:  Pulse checked post tourniquet inflation. IV NSL discontinued post injection. Narrative:   Performed by: Personally  Resident/CRNA: Glynn Octave, CRNA  Bier block (IV Regional)

## 2012-07-25 NOTE — Op Note (Signed)
07/24/2012  11:32 AM  PATIENT:  Rebecca [name]  42 y.o. female  PRE-OPERATIVE DIAGNOSIS:  Right Carpal tunnel syndrome  POST-OPERATIVE DIAGNOSIS:  Right Carpal tunnel syndrome  PROCEDURE:  Procedure(s) with comments: CARPAL TUNNEL RELEASE (Right) - Right Carpal Tunnel Release  Indications for procedure pain and paresthesias persistent despite nonoperative therapy.  Details of procedure after site identification confirmation and marking along with chart review the patient was taken to the operating room. Appropriate weight-based antibiotics were started. The patient underwent a right upper extremity regional block using a Bier technique.  The arm was then prepped and draped sterilely  The timeout procedure was executed.  A mid palmar incision was made over the right carpal tunnel. The subcutaneous tissue was divided down to the palmar fascia. An unusual amount of bleeding was encountered and cauterized with electrocautery. The distal aspect of the transverse carpal ligament was identified and by the perivascular fat. A blood measurement was passed beneath the transverse carpal ligament and the transverse carpal ligament was divided. Division of the transverse carpal ligament was taken proximally into the flexor retinaculum.  The wound was irrigated and checked for any bleeding. Hemostasis was maintained with electrocautery.  The wound was closed with a running 3-0 nylon suture and injected with Marcaine plain 0.5% approximately 10 cc.  The sterile dressing was applied the tourniquet was released the fingers were checked for viability and was found to have excellent capillary refill and color.  The patient was taken to the recovery room in stable condition  FINDINGS: STENOTIC CARPAL TUNNEL  SURGEON:  Surgeon(s) and Role:    * Vickki Hearing, MD - Primary  PHYSICIAN ASSISTANT:   ASSISTANTS: CATHERINE PAGE    ANESTHESIA:   regional  EBL:  Total I/O In: 300 [I.V.:300] Out: -    BLOOD ADMINISTERED:none  DRAINS: none   LOCAL MEDICATIONS USED:  MARCAINE    and Amount: 10 ml  SPECIMEN:  No Specimen  DISPOSITION OF SPECIMEN:  N/A  COUNTS:  YES  TOURNIQUET:   Total Tourniquet Time Documented: Upper Arm (Right) - 34 minutes Total: Upper Arm (Right) - 34 minutes   DICTATION: .Reubin Milan Dictation  PLAN OF CARE: DISCHARGE   PATIENT DISPOSITION:  PACU - hemodynamically stable.   Delay start of Pharmacological VTE agent (>24hrs) due to surgical blood loss or risk of bleeding: not applicable

## 2012-07-26 ENCOUNTER — Encounter (HOSPITAL_COMMUNITY): Payer: Self-pay | Admitting: Orthopedic Surgery

## 2012-07-27 ENCOUNTER — Ambulatory Visit (INDEPENDENT_AMBULATORY_CARE_PROVIDER_SITE_OTHER): Payer: Medicaid Other | Admitting: Orthopedic Surgery

## 2012-07-27 VITALS — BP 112/82 | Ht 65.0 in | Wt 286.0 lb

## 2012-07-27 DIAGNOSIS — G5601 Carpal tunnel syndrome, right upper limb: Secondary | ICD-10-CM

## 2012-07-27 DIAGNOSIS — G5600 Carpal tunnel syndrome, unspecified upper limb: Secondary | ICD-10-CM

## 2012-07-27 NOTE — Progress Notes (Signed)
Patient ID: Rebecca [name], female   DOB: February 16, 1971, 42 y.o.   MRN: [id number] Chief Complaint  Patient presents with  . Follow-up    Post op 1 Carpal Tunnel Release DOS 07/24/12    Carpal tunnel syndrome right status post release doing well his incision clean recommend active range of motion exercises remove sutures on May 5

## 2012-08-07 ENCOUNTER — Encounter: Payer: Self-pay | Admitting: Orthopedic Surgery

## 2012-08-07 ENCOUNTER — Ambulatory Visit (INDEPENDENT_AMBULATORY_CARE_PROVIDER_SITE_OTHER): Payer: Medicaid Other | Admitting: Orthopedic Surgery

## 2012-08-07 VITALS — BP 110/64 | Ht 65.0 in | Wt 286.0 lb

## 2012-08-07 DIAGNOSIS — Z9889 Other specified postprocedural states: Secondary | ICD-10-CM

## 2012-08-07 DIAGNOSIS — G5600 Carpal tunnel syndrome, unspecified upper limb: Secondary | ICD-10-CM

## 2012-08-07 DIAGNOSIS — G5601 Carpal tunnel syndrome, right upper limb: Secondary | ICD-10-CM

## 2012-08-07 NOTE — Patient Instructions (Addendum)
Opposition exercises

## 2012-08-07 NOTE — Progress Notes (Signed)
Patient ID: Rebecca [name], female   DOB: 06-27-1970, 42 y.o.   MRN: [id number] Postop visit #2 sutures status post carpal tunnel release partial relief at this point  Distal part of the wound separating output some skin seal and Steri-Strips on that  She will start opposition exercises followup with me in 4 weeks

## 2012-09-05 ENCOUNTER — Encounter: Payer: Self-pay | Admitting: Orthopedic Surgery

## 2012-09-05 ENCOUNTER — Ambulatory Visit: Payer: Medicaid Other | Admitting: Orthopedic Surgery

## 2012-10-18 DIAGNOSIS — E079 Disorder of thyroid, unspecified: Secondary | ICD-10-CM | POA: Insufficient documentation

## 2013-06-28 DIAGNOSIS — F988 Other specified behavioral and emotional disorders with onset usually occurring in childhood and adolescence: Secondary | ICD-10-CM | POA: Insufficient documentation

## 2015-12-04 DIAGNOSIS — E785 Hyperlipidemia, unspecified: Secondary | ICD-10-CM | POA: Diagnosis not present

## 2015-12-04 DIAGNOSIS — I10 Essential (primary) hypertension: Secondary | ICD-10-CM | POA: Diagnosis not present

## 2015-12-04 DIAGNOSIS — Z0000 Encounter for general adult medical examination without abnormal findings: Secondary | ICD-10-CM | POA: Diagnosis not present

## 2015-12-04 DIAGNOSIS — E039 Hypothyroidism, unspecified: Secondary | ICD-10-CM | POA: Diagnosis not present

## 2016-02-10 ENCOUNTER — Observation Stay (HOSPITAL_BASED_OUTPATIENT_CLINIC_OR_DEPARTMENT_OTHER)
Admission: EM | Admit: 2016-02-10 | Discharge: 2016-02-11 | Disposition: A | Discharge status: Home / Self Care | Payer: BLUE CROSS/BLUE SHIELD | Attending: Urology | Admitting: Urology

## 2016-02-10 ENCOUNTER — Emergency Department (HOSPITAL_COMMUNITY): Payer: BLUE CROSS/BLUE SHIELD | Admitting: Anesthesiology

## 2016-02-10 ENCOUNTER — Emergency Department (HOSPITAL_BASED_OUTPATIENT_CLINIC_OR_DEPARTMENT_OTHER): Payer: BLUE CROSS/BLUE SHIELD

## 2016-02-10 ENCOUNTER — Encounter (HOSPITAL_COMMUNITY)
Admission: EM | Disposition: A | Discharge status: Home / Self Care | Payer: Self-pay | Source: Home / Self Care | Attending: Emergency Medicine

## 2016-02-10 ENCOUNTER — Encounter (HOSPITAL_BASED_OUTPATIENT_CLINIC_OR_DEPARTMENT_OTHER): Payer: Self-pay | Admitting: *Deleted

## 2016-02-10 DIAGNOSIS — Z466 Encounter for fitting and adjustment of urinary device: Secondary | ICD-10-CM | POA: Diagnosis not present

## 2016-02-10 DIAGNOSIS — N132 Hydronephrosis with renal and ureteral calculous obstruction: Principal | ICD-10-CM | POA: Insufficient documentation

## 2016-02-10 DIAGNOSIS — E039 Hypothyroidism, unspecified: Secondary | ICD-10-CM | POA: Diagnosis not present

## 2016-02-10 DIAGNOSIS — I10 Essential (primary) hypertension: Secondary | ICD-10-CM | POA: Diagnosis not present

## 2016-02-10 DIAGNOSIS — N1330 Unspecified hydronephrosis: Secondary | ICD-10-CM

## 2016-02-10 DIAGNOSIS — N390 Urinary tract infection, site not specified: Secondary | ICD-10-CM | POA: Diagnosis not present

## 2016-02-10 DIAGNOSIS — Z87442 Personal history of urinary calculi: Secondary | ICD-10-CM | POA: Diagnosis not present

## 2016-02-10 DIAGNOSIS — R319 Hematuria, unspecified: Secondary | ICD-10-CM | POA: Diagnosis not present

## 2016-02-10 DIAGNOSIS — Z6841 Body Mass Index (BMI) 40.0 and over, adult: Secondary | ICD-10-CM | POA: Insufficient documentation

## 2016-02-10 DIAGNOSIS — N200 Calculus of kidney: Secondary | ICD-10-CM

## 2016-02-10 DIAGNOSIS — N202 Calculus of kidney with calculus of ureter: Secondary | ICD-10-CM | POA: Diagnosis not present

## 2016-02-10 DIAGNOSIS — N201 Calculus of ureter: Secondary | ICD-10-CM | POA: Diagnosis present

## 2016-02-10 DIAGNOSIS — R8271 Bacteriuria: Secondary | ICD-10-CM | POA: Diagnosis not present

## 2016-02-10 DIAGNOSIS — N131 Hydronephrosis with ureteral stricture, not elsewhere classified: Secondary | ICD-10-CM | POA: Diagnosis not present

## 2016-02-10 HISTORY — PX: CYSTOSCOPY W/ URETERAL STENT PLACEMENT: SHX1429

## 2016-02-10 LAB — URINE MICROSCOPIC-ADD ON

## 2016-02-10 LAB — URINALYSIS, ROUTINE W REFLEX MICROSCOPIC
Bilirubin Urine: NEGATIVE
Glucose, UA: NEGATIVE mg/dL
Ketones, ur: NEGATIVE mg/dL
Nitrite: NEGATIVE
PROTEIN: NEGATIVE mg/dL
Specific Gravity, Urine: 1.023 (ref 1.005–1.030)
pH: 5 (ref 5.0–8.0)

## 2016-02-10 LAB — CBC WITH DIFFERENTIAL/PLATELET
BASOS ABS: 0 10*3/uL (ref 0.0–0.1)
Basophils Relative: 1 %
EOS ABS: 0.1 10*3/uL (ref 0.0–0.7)
EOS PCT: 2 %
HCT: 45.5 % (ref 36.0–46.0)
Hemoglobin: 15.4 g/dL — ABNORMAL HIGH (ref 12.0–15.0)
Lymphocytes Relative: 37 %
Lymphs Abs: 2.6 10*3/uL (ref 0.7–4.0)
MCH: 30.5 pg (ref 26.0–34.0)
MCHC: 33.8 g/dL (ref 30.0–36.0)
MCV: 90.1 fL (ref 78.0–100.0)
Monocytes Absolute: 0.6 10*3/uL (ref 0.1–1.0)
Monocytes Relative: 9 %
Neutro Abs: 3.8 10*3/uL (ref 1.7–7.7)
Neutrophils Relative %: 53 %
PLATELETS: 314 10*3/uL (ref 150–400)
RBC: 5.05 MIL/uL (ref 3.87–5.11)
RDW: 13.6 % (ref 11.5–15.5)
WBC: 7.2 10*3/uL (ref 4.0–10.5)

## 2016-02-10 LAB — BASIC METABOLIC PANEL
ANION GAP: 7 (ref 5–15)
BUN: 19 mg/dL (ref 6–20)
CO2: 26 mmol/L (ref 22–32)
Calcium: 9 mg/dL (ref 8.9–10.3)
Chloride: 109 mmol/L (ref 101–111)
Creatinine, Ser: 1.41 mg/dL — ABNORMAL HIGH (ref 0.44–1.00)
GFR, EST AFRICAN AMERICAN: 52 mL/min — AB (ref >60)
GFR, EST NON AFRICAN AMERICAN: 45 mL/min — AB (ref >60)
Glucose, Bld: 143 mg/dL — ABNORMAL HIGH (ref 65–99)
POTASSIUM: 4 mmol/L (ref 3.5–5.1)
SODIUM: 142 mmol/L (ref 135–145)

## 2016-02-10 LAB — PREGNANCY, URINE: PREG TEST UR: NEGATIVE

## 2016-02-10 SURGERY — CYSTOSCOPY, WITH RETROGRADE PYELOGRAM AND URETERAL STENT INSERTION
Anesthesia: General | Laterality: Bilateral

## 2016-02-10 MED ORDER — ACETAMINOPHEN 325 MG PO TABS
650.0000 mg | ORAL_TABLET | ORAL | Status: DC | PRN
  Administered 2016-02-11: 650 mg via ORAL
  Filled 2016-02-10: qty 2

## 2016-02-10 MED ORDER — FENTANYL CITRATE (PF) 100 MCG/2ML IJ SOLN
INTRAMUSCULAR | Status: DC | PRN
  Administered 2016-02-10 (×2): 25 ug via INTRAVENOUS

## 2016-02-10 MED ORDER — METOCLOPRAMIDE HCL 5 MG/ML IJ SOLN
10.0000 mg | Freq: Once | INTRAMUSCULAR | Status: AC
  Administered 2016-02-10: 10 mg via INTRAVENOUS
  Filled 2016-02-10: qty 2

## 2016-02-10 MED ORDER — DEXTROSE 5 % IV SOLN
1.0000 g | Freq: Once | INTRAVENOUS | Status: AC
  Administered 2016-02-10: 1 g via INTRAVENOUS
  Filled 2016-02-10: qty 10

## 2016-02-10 MED ORDER — SODIUM CHLORIDE 0.9 % IV SOLN
Freq: Once | INTRAVENOUS | Status: AC
  Administered 2016-02-10: 14:00:00 via INTRAVENOUS

## 2016-02-10 MED ORDER — PROMETHAZINE HCL 25 MG PO TABS: 25.0000 mg | ORAL_TABLET | Freq: Once | ORAL | Status: DC

## 2016-02-10 MED ORDER — SUCCINYLCHOLINE CHLORIDE 20 MG/ML IJ SOLN
INTRAMUSCULAR | Status: DC | PRN
  Administered 2016-02-10: 160 mg via INTRAVENOUS

## 2016-02-10 MED ORDER — LIDOCAINE HCL (CARDIAC) 20 MG/ML IV SOLN
INTRAVENOUS | Status: DC | PRN
  Administered 2016-02-10: 100 mg via INTRAVENOUS

## 2016-02-10 MED ORDER — KETOROLAC TROMETHAMINE 30 MG/ML IJ SOLN
INTRAMUSCULAR | Status: AC
  Administered 2016-02-10: 30 mg
  Filled 2016-02-10: qty 1

## 2016-02-10 MED ORDER — ONDANSETRON HCL 4 MG/2ML IJ SOLN
4.0000 mg | INTRAMUSCULAR | Status: DC | PRN
  Administered 2016-02-10: 4 mg via INTRAVENOUS
  Filled 2016-02-10: qty 2

## 2016-02-10 MED ORDER — ONDANSETRON HCL 4 MG/2ML IJ SOLN
4.0000 mg | INTRAMUSCULAR | Status: AC | PRN
  Administered 2016-02-10 (×2): 4 mg via INTRAVENOUS
  Filled 2016-02-10 (×2): qty 2

## 2016-02-10 MED ORDER — DEXTROSE 5 % IV SOLN
2.0000 g | INTRAVENOUS | Status: DC
  Administered 2016-02-11: 2 g via INTRAVENOUS
  Filled 2016-02-10 (×2): qty 2

## 2016-02-10 MED ORDER — LIDOCAINE 2% (20 MG/ML) 5 ML SYRINGE
INTRAMUSCULAR | Status: AC
  Filled 2016-02-10: qty 5

## 2016-02-10 MED ORDER — LACTATED RINGERS IV SOLN: INTRAVENOUS | Status: DC

## 2016-02-10 MED ORDER — PHENYLEPHRINE HCL 10 MG/ML IJ SOLN
INTRAMUSCULAR | Status: DC | PRN
  Administered 2016-02-10 (×2): 40 ug via INTRAVENOUS
  Administered 2016-02-10 (×2): 80 ug via INTRAVENOUS
  Administered 2016-02-10: 40 ug via INTRAVENOUS

## 2016-02-10 MED ORDER — HYDROMORPHONE HCL 1 MG/ML IJ SOLN
1.0000 mg | Freq: Once | INTRAMUSCULAR | Status: AC
Start: 2016-02-10 — End: 2016-02-10
  Administered 2016-02-10: 1 mg via INTRAVENOUS
  Filled 2016-02-10: qty 1

## 2016-02-10 MED ORDER — FENTANYL CITRATE (PF) 100 MCG/2ML IJ SOLN
INTRAMUSCULAR | Status: AC
  Filled 2016-02-10: qty 2

## 2016-02-10 MED ORDER — LEVOTHYROXINE SODIUM 75 MCG PO TABS
175.0000 ug | ORAL_TABLET | Freq: Every day | ORAL | Status: DC
  Administered 2016-02-11: 175 ug via ORAL
  Filled 2016-02-10: qty 1

## 2016-02-10 MED ORDER — ONDANSETRON HCL 4 MG/2ML IJ SOLN
INTRAMUSCULAR | Status: AC
  Filled 2016-02-10: qty 2

## 2016-02-10 MED ORDER — FENTANYL CITRATE (PF) 100 MCG/2ML IJ SOLN
50.0000 ug | INTRAMUSCULAR | Status: DC | PRN
  Administered 2016-02-10 (×2): 50 ug via INTRAVENOUS
  Filled 2016-02-10 (×2): qty 2

## 2016-02-10 MED ORDER — SODIUM CHLORIDE 0.9 % IR SOLN
Status: DC | PRN
Start: 2016-02-10 — End: 2016-02-10
  Administered 2016-02-10: 3000 mL

## 2016-02-10 MED ORDER — DEXAMETHASONE SODIUM PHOSPHATE 10 MG/ML IJ SOLN
INTRAMUSCULAR | Status: DC | PRN
  Administered 2016-02-10: 10 mg via INTRAVENOUS

## 2016-02-10 MED ORDER — PROMETHAZINE HCL 25 MG/ML IJ SOLN: 6.2500 mg | INTRAMUSCULAR | Status: DC | PRN

## 2016-02-10 MED ORDER — AMPHETAMINE-DEXTROAMPHETAMINE 10 MG PO TABS: 10.0000 mg | ORAL_TABLET | Freq: Two times a day (BID) | ORAL | Status: DC | PRN

## 2016-02-10 MED ORDER — OXYCODONE HCL 5 MG PO TABS: 5.0000 mg | ORAL_TABLET | ORAL | Status: DC | PRN

## 2016-02-10 MED ORDER — PROPOFOL 10 MG/ML IV BOLUS
INTRAVENOUS | Status: AC
  Filled 2016-02-10: qty 20

## 2016-02-10 MED ORDER — MIDAZOLAM HCL 2 MG/2ML IJ SOLN
INTRAMUSCULAR | Status: AC
  Filled 2016-02-10: qty 2

## 2016-02-10 MED ORDER — PROPOFOL 10 MG/ML IV BOLUS
INTRAVENOUS | Status: DC | PRN
  Administered 2016-02-10: 300 mg via INTRAVENOUS

## 2016-02-10 MED ORDER — SUCCINYLCHOLINE CHLORIDE 20 MG/ML IJ SOLN
INTRAMUSCULAR | Status: AC
  Filled 2016-02-10: qty 1

## 2016-02-10 MED ORDER — SENNA 8.6 MG PO TABS
1.0000 | ORAL_TABLET | Freq: Two times a day (BID) | ORAL | Status: DC
  Administered 2016-02-10 – 2016-02-11 (×2): 8.6 mg via ORAL
  Filled 2016-02-10 (×2): qty 1

## 2016-02-10 MED ORDER — ONDANSETRON HCL 4 MG/2ML IJ SOLN
INTRAMUSCULAR | Status: DC | PRN
  Administered 2016-02-10: 4 mg via INTRAVENOUS

## 2016-02-10 MED ORDER — SODIUM CHLORIDE 0.9 % IV SOLN
INTRAVENOUS | Status: DC
  Administered 2016-02-10 – 2016-02-11 (×2): via INTRAVENOUS

## 2016-02-10 MED ORDER — LACTATED RINGERS IV SOLN
INTRAVENOUS | Status: DC | PRN
  Administered 2016-02-10: 18:00:00 via INTRAVENOUS

## 2016-02-10 MED ORDER — FENTANYL CITRATE (PF) 100 MCG/2ML IJ SOLN: 25.0000 ug | INTRAMUSCULAR | Status: DC | PRN

## 2016-02-10 MED ORDER — HYDROMORPHONE HCL 1 MG/ML IJ SOLN
1.0000 mg | Freq: Once | INTRAMUSCULAR | Status: AC
  Administered 2016-02-10: 1 mg via INTRAVENOUS
  Filled 2016-02-10: qty 1

## 2016-02-10 MED ORDER — DEXTROSE 5 % IV SOLN: 1.0000 g | INTRAVENOUS | Status: DC

## 2016-02-10 MED ORDER — IOHEXOL 300 MG/ML  SOLN
INTRAMUSCULAR | Status: DC | PRN
  Administered 2016-02-10: 30 mL

## 2016-02-10 MED ORDER — PHENYLEPHRINE 40 MCG/ML (10ML) SYRINGE FOR IV PUSH (FOR BLOOD PRESSURE SUPPORT)
PREFILLED_SYRINGE | INTRAVENOUS | Status: AC
  Filled 2016-02-10: qty 10

## 2016-02-10 MED ORDER — HYDROMORPHONE HCL 1 MG/ML IJ SOLN: 0.5000 mg | INTRAMUSCULAR | Status: DC | PRN

## 2016-02-10 MED ORDER — KETOROLAC TROMETHAMINE 30 MG/ML IJ SOLN: 30.0000 mg | Freq: Once | INTRAMUSCULAR | Status: DC | PRN

## 2016-02-10 MED ORDER — MIDAZOLAM HCL 5 MG/5ML IJ SOLN
INTRAMUSCULAR | Status: DC | PRN
  Administered 2016-02-10: 2 mg via INTRAVENOUS

## 2016-02-10 SURGICAL SUPPLY — 12 items
BAG URO CATCHER STRL LF (MISCELLANEOUS) ×2 IMPLANT
BASKET ZERO TIP NITINOL 2.4FR (BASKET) IMPLANT
CATH INTERMIT  6FR 70CM (CATHETERS) ×2 IMPLANT
CLOTH BEACON ORANGE TIMEOUT ST (SAFETY) ×2 IMPLANT
GLOVE BIOGEL M STRL SZ7.5 (GLOVE) ×2 IMPLANT
GOWN STRL REUS W/TWL LRG LVL3 (GOWN DISPOSABLE) ×2 IMPLANT
GUIDEWIRE ANG ZIPWIRE 038X150 (WIRE) IMPLANT
GUIDEWIRE STR DUAL SENSOR (WIRE) ×2 IMPLANT
MANIFOLD NEPTUNE II (INSTRUMENTS) ×2 IMPLANT
PACK CYSTO (CUSTOM PROCEDURE TRAY) ×2 IMPLANT
STENT URET 6FRX24 CONTOUR (STENTS) ×4 IMPLANT
TUBING CONNECTING 10 (TUBING) ×2 IMPLANT

## 2016-02-10 NOTE — ED Triage Notes (Addendum)
C/o lower right abd pain that started in right flank. Hx of kidney stones and feels same. Pt c/o n/v. No urinary sx. Pt took 2 vicodin at 0700 this am.

## 2016-02-10 NOTE — ED Notes (Addendum)
Called [facility] to let them know about the transfer Dr. Johney FrameIassc receiving[facility]

## 2016-02-10 NOTE — ED Notes (Signed)
[facility] here to transfer pt to [facility] ED. 

## 2016-02-10 NOTE — ED Provider Notes (Signed)
[facility]-EMERGENCY DEPT Provider Note   CSN: [id number] Arrival date & time: 02/10/16  7829     History   Chief Complaint Chief Complaint  Patient presents with  . Abdominal Pain    HPI Rebecca [name] is a 45 y.o. female.  HPI   Rebecca [name] is a 45 y.o. female, with a history of HTN and renal stones, presenting to the ED with right flank pain for the past week. Transferred from [facility], seen by Dr. Clydene Pugh. Patient has signs of infection on UA with a right sided 6mm obstructing renal stone. Patient to be seen by Dr. Berneice Heinrich, urologist here in the [facility] ED. Blood cultures were drawn and patient was started on ceftriaxone IV prior to transfer.  Upon my assessment, patient states her pain is 6/10, but improved from previous. Nausea is better, but still present.     Past Medical History:  Diagnosis Date  . Hypertension   . Hypothyroidism   . Kidney stones   . Thyroid disease     Patient Active Problem List   Diagnosis Date Noted  . S/P carpal tunnel release 08/07/2012  . CTS (carpal tunnel syndrome) 06/27/2012  . ANKLE SPRAIN 02/24/2010  . PAIN IN JOINT, ANKLE AND FOOT 01/14/2010    Past Surgical History:  Procedure Laterality Date  . CARPAL TUNNEL RELEASE Right 07/24/2012   Procedure: CARPAL TUNNEL RELEASE;  Surgeon: Vickki Hearing, MD;  Location: [facility];  Service: Orthopedics;  Laterality: Right;  Right Carpal Tunnel Release  . CYSTOSCOPY     x6 for stones-with stenting  . THYROIDECTOMY      OB History    No data available       Home Medications    Prior to Admission medications   Medication Sig Start Date End Date Taking? Authorizing Provider  amphetamine-dextroamphetamine (ADDERALL) 10 MG tablet Take 10 mg by mouth 2 (two) times daily as needed (adhd).   Yes Historical Provider, MD  HYDROcodone-acetaminophen (NORCO) 7.5-325 MG per tablet Take 1 tablet by mouth every 4 (four) hours as needed for pain. 07/24/12  Yes Vickki Hearing, MD    levothyroxine (SYNTHROID, LEVOTHROID) 175 MCG tablet Take 175 mcg by mouth daily before breakfast.   Yes Historical Provider, MD  lisinopril (PRINIVIL,ZESTRIL) 40 MG tablet Take 40 mg by mouth daily.   Yes Historical Provider, MD    Family History No family history on file.  Social History Social History  Substance Use Topics  . Smoking status: Never Smoker  . Smokeless tobacco: Never Used  . Alcohol use No     Allergies   Patient has no known allergies.   Review of Systems Review of Systems  Constitutional: Negative for chills and fever.  Genitourinary: Positive for dysuria and flank pain.  All other systems reviewed and are negative.    Physical Exam Updated Vital Signs BP 153/100 (BP Location: Right Arm)   Pulse 81   Temp 98.1 F (36.7 C)   Resp 18   Ht 5\' 5"  (1.651 m)   Wt (!) 147.4 kg   SpO2 99%   BMI 54.08 kg/m   Physical Exam  Constitutional: She appears well-developed and well-nourished. No distress.  HENT:  Head: Normocephalic and atraumatic.  Eyes: Conjunctivae are normal.  Neck: Neck supple.  Cardiovascular: Normal rate, regular rhythm, normal heart sounds and intact distal pulses.   Pulmonary/Chest: Effort normal and breath sounds normal. No respiratory distress.  Abdominal: Soft. There is no tenderness. There is no  guarding.    Musculoskeletal: She exhibits no edema.  Lymphadenopathy:    She has no cervical adenopathy.  Neurological: She is alert.  Skin: Skin is warm and dry. She is not diaphoretic.  Psychiatric: She has a normal mood and affect. Her behavior is normal.  Nursing note and vitals reviewed.    ED Treatments / Results  Labs (all labs ordered are listed, but only abnormal results are displayed) Labs Reviewed  CBC WITH DIFFERENTIAL/PLATELET - Abnormal; Notable for the following:       Result Value   Hemoglobin 15.4 (*)    All other components within normal limits  BASIC METABOLIC PANEL - Abnormal; Notable for the  following:    Glucose, Bld 143 (*)    Creatinine, Ser 1.41 (*)    GFR calc non Af Amer 45 (*)    GFR calc Af Amer 52 (*)    All other components within normal limits  URINALYSIS, ROUTINE W REFLEX MICROSCOPIC (NOT AT [facility]) - Abnormal; Notable for the following:    APPearance CLOUDY (*)    Hgb urine dipstick LARGE (*)    Leukocytes, UA TRACE (*)    All other components within normal limits  URINE MICROSCOPIC-ADD ON - Abnormal; Notable for the following:    Squamous Epithelial / LPF 0-5 (*)    Bacteria, UA MANY (*)    All other components within normal limits  URINE CULTURE  CULTURE, BLOOD (ROUTINE X 2)  CULTURE, BLOOD (ROUTINE X 2)  PREGNANCY, URINE    EKG  EKG Interpretation None       Radiology Ct Renal Stone Study  Result Date: 02/10/2016 CLINICAL DATA:  Right flank pain EXAM: CT ABDOMEN AND PELVIS WITHOUT CONTRAST TECHNIQUE: Multidetector CT imaging of the abdomen and pelvis was performed following the standard protocol without IV contrast. COMPARISON:  05/26/2007 FINDINGS: Lower chest: The lung bases are unremarkable. Hepatobiliary: Unenhanced liver shows no biliary ductal dilatation. No calcified gallstones are noted within gallbladder. Pancreas: Unenhanced pancreas is unremarkable. Spleen: Unenhanced spleen is unremarkable. Adrenals/Urinary Tract: No adrenal gland mass. There is bilateral nonobstructive nephrolithiasis. Largest calcified nonobstructive calculus midpole of the right kidney measures 6 mm. Largest nonobstructive calculus lower pole of the left kidney measures 5 mm. There is mild right hydronephrosis and right hydroureter. Axial image 77 there is calcified obstructive calculus in distal right ureter measures 6 mm at the level of inferior aspect of the right SI joint. The urinary bladder is empty limiting its assessment. Stomach/Bowel: There is no small bowel obstruction. Normal appendix. No pericecal inflammation. Normal appendix. No colitis or diverticulitis.  Moderate stool noted within rectum. Vascular/Lymphatic: No aortic aneurysm. No retroperitoneal or mesenteric adenopathy. Reproductive: The uterus is anteflexed. IUD noted within uterus. No adnexal mass. Other: No ascites or free abdominal air.  No inguinal adenopathy. Musculoskeletal: No destructive bony lesions are noted. Sagittal images of the spine shows mild degenerative changes lower thoracic spine. IMPRESSION: 1. There is bilateral nonobstructive nephrolithiasis. 2. Mild right hydronephrosis and right hydroureter. There is 6 mm calcified obstructive calculus in distal right ureter at the level of inferior aspect of right SI joint. Mild right periureteral stranding. 3. No pericecal inflammation.  Normal appendix. 4. No colitis or diverticulitis. 5. No small bowel obstruction. 6. IUD within anteflexed uterus. Electronically Signed   By: Natasha MeadLiviu  Pop M.D.   On: 02/10/2016 09:43    Procedures Procedures (including critical care time)  Medications Ordered in ED Medications  fentaNYL (SUBLIMAZE) injection 50 mcg (50 mcg Intravenous  Given 02/10/16 1046)  ondansetron (ZOFRAN) injection 4 mg (4 mg Intravenous Given 02/10/16 1334)  ondansetron (ZOFRAN) injection 4 mg (4 mg Intravenous Given 02/10/16 1132)  cefTRIAXone (ROCEPHIN) 1 g in dextrose 5 % 50 mL IVPB (0 g Intravenous Stopped 02/10/16 1118)  HYDROmorphone (DILAUDID) injection 1 mg (1 mg Intravenous Given 02/10/16 1132)  HYDROmorphone (DILAUDID) injection 1 mg (1 mg Intravenous Given 02/10/16 1334)  0.9 %  sodium chloride infusion ( Intravenous New Bag/Given 02/10/16 1339)  metoCLOPramide (REGLAN) injection 10 mg (10 mg Intravenous Given 02/10/16 1422)  HYDROmorphone (DILAUDID) injection 1 mg (1 mg Intravenous Given 02/10/16 1556)     Initial Impression / Assessment and Plan / ED Course  I have reviewed the triage vital signs and the nursing notes.  Pertinent labs & imaging results that were available during my care of the patient were reviewed by me  and considered in my medical decision making (see chart for details).  Clinical Course      Dr. Berneice HeinrichManny [facility] and states he will come see the patient. Patient's pain and nausea managed in the ED until Dr. Emmaline LifeManny's arrival. Dr. Berneice HeinrichManny assessed the patient and transfered the patient to the OR.  Vitals:   02/10/16 0825 02/10/16 1052 02/10/16 1537 02/10/16 1631  BP: (!) 188/126 153/100 147/98 121/60  Pulse: 81 81 86 94  Resp: 20 18 18 17   Temp: 98.1 F (36.7 C)     SpO2: 99% 99% 96% 95%  Weight:      Height:        Final Clinical Impressions(s) / ED Diagnoses   Final diagnoses:  Right nephrolithiasis  Hydronephrosis, right  Urinary tract infection with hematuria, site unspecified    New Prescriptions New Prescriptions   No medications on file     Anselm PancoastShawn C Olin Gurski, PA-C 02/10/16 1740    Shaune Pollackameron Isaacs, MD 02/11/16 1001

## 2016-02-10 NOTE — H&P (Signed)
Rebecca [name] is an 45 y.o. female.    Chief Complaint: Right Ureteral Stone, Bilateral Renal Stones, Bacteruria  HPI:   1 - Recurrent Nephrolithiasis -  Pre-2017: ureteroscopy x 10 by retired MD in [place]. 02/2016 - Rt 82m distal stone with mod hydro and bilateral scattered 2-324mnon-obstructing stones on ER CT on eval flank pain.   2- Bacteruria - bacteruria on ER UA at presentation. H/o prior urosepsis at times of stones per report. UCX pending. [place] [facility] for morbid obesity, thyroid surgery, carpel tunnel surgery, tympanoplasty. No CV disease. No strong blood thinners.   Today "[name] seen for above. Last meal >24 hours ago.   Past Medical History:  Diagnosis Date  . Hypertension   . Hypothyroidism   . Kidney stones   . Thyroid disease     Past Surgical History:  Procedure Laterality Date  . CARPAL TUNNEL RELEASE Right 07/24/2012   Procedure: CARPAL TUNNEL RELEASE;  Surgeon: StCarole CivilMD;  Location: [facility];  Service: Orthopedics;  Laterality: Right;  Right Carpal Tunnel Release  . CYSTOSCOPY     x6 for stones-with stenting  . THYROIDECTOMY      No family history on file. Social History:  reports that she has never smoked. She has never used smokeless tobacco. She reports that she does not drink alcohol or use drugs.  Allergies: No Known Allergies   (Not in a hospital admission)  Results for orders placed or performed during the hospital encounter of 02/10/16 (from the past 48 hour(s))  Urinalysis, Routine w reflex microscopic [facility]    Status: Abnormal   Collection Time: 02/10/16  8:30 AM  Result Value Ref Range   Color, Urine YELLOW YELLOW   APPearance CLOUDY (A) CLEAR   Specific Gravity, Urine 1.023 1.005 - 1.030   pH 5.0 5.0 - 8.0   Glucose, UA NEGATIVE NEGATIVE mg/dL   Hgb urine dipstick LARGE (A) NEGATIVE   Bilirubin Urine NEGATIVE NEGATIVE   Ketones, ur NEGATIVE NEGATIVE mg/dL   Protein,  ur NEGATIVE NEGATIVE mg/dL   Nitrite NEGATIVE NEGATIVE   Leukocytes, UA TRACE (A) NEGATIVE  Pregnancy, urine     Status: None   Collection Time: 02/10/16  8:30 AM  Result Value Ref Range   Preg Test, Ur NEGATIVE NEGATIVE    Comment:        THE SENSITIVITY OF THIS METHODOLOGY IS >20 mIU/mL.   Urine microscopic-add on     Status: Abnormal   Collection Time: 02/10/16  8:30 AM  Result Value Ref Range   Squamous Epithelial / LPF 0-5 (A) NONE SEEN   WBC, UA 6-30 0 - 5 WBC/hpf   RBC / HPF 6-30 0 - 5 RBC/hpf   Bacteria, UA MANY (A) NONE SEEN  CBC with Differential/Platelet     Status: Abnormal   Collection Time: 02/10/16  8:50 AM  Result Value Ref Range   WBC 7.2 4.0 - 10.5 K/uL   RBC 5.05 3.87 - 5.11 MIL/uL   Hemoglobin 15.4 (H) 12.0 - 15.0 g/dL   HCT 45.5 36.0 - 46.0 %   MCV 90.1 78.0 - 100.0 fL   MCH 30.5 26.0 - 34.0 pg   MCHC 33.8 30.0 - 36.0 g/dL   RDW 13.6 11.5 - 15.5 %   Platelets 314 150 - 400 K/uL   Neutrophils Relative % 53 %   Neutro Abs 3.8 1.7 - 7.7 K/uL  Lymphocytes Relative 37 %   Lymphs Abs 2.6 0.7 - 4.0 K/uL   Monocytes Relative 9 %   Monocytes Absolute 0.6 0.1 - 1.0 K/uL   Eosinophils Relative 2 %   Eosinophils Absolute 0.1 0.0 - 0.7 K/uL   Basophils Relative 1 %   Basophils Absolute 0.0 0.0 - 0.1 K/uL  Basic metabolic panel     Status: Abnormal   Collection Time: 02/10/16  8:50 AM  Result Value Ref Range   Sodium 142 135 - 145 mmol/L   Potassium 4.0 3.5 - 5.1 mmol/L   Chloride 109 101 - 111 mmol/L   CO2 26 22 - 32 mmol/L   Glucose, Bld 143 (H) 65 - 99 mg/dL   BUN 19 6 - 20 mg/dL   Creatinine, Ser 1.41 (H) 0.44 - 1.00 mg/dL   Calcium 9.0 8.9 - 10.3 mg/dL   GFR calc non Af Amer 45 (L) >60 mL/min   GFR calc Af Amer 52 (L) >60 mL/min    Comment: (NOTE) The eGFR has been calculated using the CKD EPI equation. This calculation has not been validated in all clinical situations. eGFR's persistently <60 mL/min signify possible Chronic Kidney Disease.     Anion gap 7 5 - 15   Ct Renal Stone Study  Result Date: 02/10/2016 CLINICAL DATA:  Right flank pain EXAM: CT ABDOMEN AND PELVIS WITHOUT CONTRAST TECHNIQUE: Multidetector CT imaging of the abdomen and pelvis was performed following the standard protocol without IV contrast. COMPARISON:  05/26/2007 FINDINGS: Lower chest: The lung bases are unremarkable. Hepatobiliary: Unenhanced liver shows no biliary ductal dilatation. No calcified gallstones are noted within gallbladder. Pancreas: Unenhanced pancreas is unremarkable. Spleen: Unenhanced spleen is unremarkable. Adrenals/Urinary Tract: No adrenal gland mass. There is bilateral nonobstructive nephrolithiasis. Largest calcified nonobstructive calculus midpole of the right kidney measures 6 mm. Largest nonobstructive calculus lower pole of the left kidney measures 5 mm. There is mild right hydronephrosis and right hydroureter. Axial image 77 there is calcified obstructive calculus in distal right ureter measures 6 mm at the level of inferior aspect of the right SI joint. The urinary bladder is empty limiting its assessment. Stomach/Bowel: There is no small bowel obstruction. Normal appendix. No pericecal inflammation. Normal appendix. No colitis or diverticulitis. Moderate stool noted within rectum. Vascular/Lymphatic: No aortic aneurysm. No retroperitoneal or mesenteric adenopathy. Reproductive: The uterus is anteflexed. IUD noted within uterus. No adnexal mass. Other: No ascites or free abdominal air.  No inguinal adenopathy. Musculoskeletal: No destructive bony lesions are noted. Sagittal images of the spine shows mild degenerative changes lower thoracic spine. IMPRESSION: 1. There is bilateral nonobstructive nephrolithiasis. 2. Mild right hydronephrosis and right hydroureter. There is 6 mm calcified obstructive calculus in distal right ureter at the level of inferior aspect of right SI joint. Mild right periureteral stranding. 3. No pericecal inflammation.  Normal  appendix. 4. No colitis or diverticulitis. 5. No small bowel obstruction. 6. IUD within anteflexed uterus. Electronically Signed   By: Lahoma Crocker M.D.   On: 02/10/2016 09:43    Review of Systems  Constitutional: Negative for chills and fever.  HENT: Negative.   Eyes: Negative.   Respiratory: Negative.   Cardiovascular: Negative.   Gastrointestinal: Positive for nausea.  Genitourinary: Positive for flank pain.  Skin: Negative.   Neurological: Negative.   Endo/Heme/Allergies: Negative.   Psychiatric/Behavioral: Negative.     Blood pressure 153/100, pulse 81, temperature 98.1 F (36.7 C), resp. rate 18, height '5\' 5"'$  (1.651 m), weight (!) 147.4 kg (325  lb), SpO2 99 %. Physical Exam  Constitutional: She is oriented to person, place, and time. She appears well-developed.  Family at bedside  HENT:  Head: Normocephalic.  Eyes: Pupils are equal, round, and reactive to light.  Neck: Normal range of motion.  Cardiovascular: Normal rate.   Respiratory: Effort normal.  GI: Soft.  Morbid truncal obesity limits sensitivity of exam.   Genitourinary:  Genitourinary Comments: Mild Rt CVAT.   Neurological: She is alert and oriented to person, place, and time.  Skin: Skin is warm.  Psychiatric: Her behavior is normal. Judgment and thought content normal.     Assessment/Plan  1 - Right Ureteral Stone, Bilateral Renal Stones - rec cysto and bilateral ureteral stent placement today, then bilateral ureteroscopy with goal of stone free in few weeks. This will allow for clearance of infecitous parameters prior.   She would also likely benefit from dedicated metabolic eval and then yearly stone surveillance in future.   Risks, benefits, alternatives, expected peri-op course, discussed.   2- Bacteruria - Continue IV Rocephin while in house, plan on DC tomorrow as long as remains afebrile.   Alexis Frock, MD 02/10/2016, 1:15 PM

## 2016-02-10 NOTE — ED Provider Notes (Signed)
[facility] Provider Note   CSN: [id number] Arrival date & time: 02/10/16  [id number]     History   Chief Complaint Chief Complaint  Patient presents with  . Abdominal Pain    HPI Rebecca [name] is a 45 y.o. female.  The history is provided by the patient.  Abdominal Pain   This is a recurrent problem. Episode onset: 1 week ago. The problem occurs daily. The problem has been rapidly worsening. Associated with: previous kidney stones. Pain location: started in right lower back and moved to RLQ. The quality of the pain is colicky, sharp and shooting. The pain is severe. Associated symptoms include nausea and vomiting (last night). Pertinent negatives include dysuria and hematuria. Nothing aggravates the symptoms. Nothing relieves the symptoms. Past workup includes CT scan. Past medical history comments: prior stenting, multiple large stones last in 2011.    Past Medical History:  Diagnosis Date  . Hypertension   . Hypothyroidism   . Kidney stones   . Thyroid disease     Patient Active Problem List   Diagnosis Date Noted  . S/P carpal tunnel release 08/07/2012  . CTS (carpal tunnel syndrome) 06/27/2012  . ANKLE SPRAIN 02/24/2010  . PAIN IN JOINT, ANKLE AND FOOT 01/14/2010    Past Surgical History:  Procedure Laterality Date  . CARPAL TUNNEL RELEASE Right 07/24/2012   Procedure: CARPAL TUNNEL RELEASE;  Surgeon: Vickki HearingStanley E Harrison, MD;  Location: [facility];  Service: Orthopedics;  Laterality: Right;  Right Carpal Tunnel Release  . CYSTOSCOPY     x6 for stones-with stenting  . THYROIDECTOMY      OB History    No data available       Home Medications    Prior to Admission medications   Medication Sig Start Date End Date Taking? Authorizing Provider  HYDROcodone-acetaminophen (NORCO) 7.5-325 MG per tablet Take 1 tablet by mouth every 4 (four) hours as needed for pain. 07/24/12  Yes Vickki HearingStanley E Harrison, MD  levothyroxine (SYNTHROID, LEVOTHROID) 137 MCG tablet Take  137 mcg by mouth daily before breakfast.   Yes Historical Provider, MD  lisinopril (PRINIVIL,ZESTRIL) 10 MG tablet Take 10 mg by mouth daily.   Yes Historical Provider, MD    Family History No family history on file.  Social History Social History  Substance Use Topics  . Smoking status: Never Smoker  . Smokeless tobacco: Never Used  . Alcohol use No     Allergies   Patient has no known allergies.   Review of Systems Review of Systems  Gastrointestinal: Positive for abdominal pain, nausea and vomiting (last night).  Genitourinary: Negative for dysuria and hematuria.  All other systems reviewed and are negative.    Physical Exam Updated Vital Signs BP (!) 188/126 (BP Location: Right Wrist)   Pulse 81   Temp 98.1 F (36.7 C)   Resp 20   Ht 5\' 5"  (1.651 m)   Wt (!) 325 lb (147.4 kg)   SpO2 99%   BMI 54.08 kg/m   Physical Exam  Constitutional: She is oriented to person, place, and time. She appears well-developed and well-nourished. No distress.  HENT:  Head: Normocephalic.  Nose: Nose normal.  Eyes: Conjunctivae are normal.  Neck: Neck supple. No tracheal deviation present.  Cardiovascular: Normal rate and regular rhythm.   Pulmonary/Chest: Effort normal. No respiratory distress.  Abdominal: Soft. She exhibits no distension. There is tenderness in the right lower quadrant. There is no rigidity, no rebound, no guarding and no tenderness  at McBurney's point.  Obese abdomen  Neurological: She is alert and oriented to person, place, and time.  Skin: Skin is warm and dry.  Psychiatric: She has a normal mood and affect.     ED Treatments / Results  Labs (all labs ordered are listed, but only abnormal results are displayed) Labs Reviewed  CBC WITH DIFFERENTIAL/PLATELET - Abnormal; Notable for the following:       Result Value   Hemoglobin 15.4 (*)    All other components within normal limits  BASIC METABOLIC PANEL - Abnormal; Notable for the following:     Glucose, Bld 143 (*)    Creatinine, Ser 1.41 (*)    GFR calc non Af Amer 45 (*)    GFR calc Af Amer 52 (*)    All other components within normal limits  URINALYSIS, ROUTINE W REFLEX MICROSCOPIC (NOT AT [facility]) - Abnormal; Notable for the following:    APPearance CLOUDY (*)    Hgb urine dipstick LARGE (*)    Leukocytes, UA TRACE (*)    All other components within normal limits  URINE MICROSCOPIC-ADD ON - Abnormal; Notable for the following:    Squamous Epithelial / LPF 0-5 (*)    Bacteria, UA MANY (*)    All other components within normal limits  URINE CULTURE  CULTURE, BLOOD (ROUTINE X 2)  CULTURE, BLOOD (ROUTINE X 2)  PREGNANCY, URINE    EKG  EKG Interpretation None       Radiology Ct Renal Stone Study  Result Date: 02/10/2016 CLINICAL DATA:  Right flank pain EXAM: CT ABDOMEN AND PELVIS WITHOUT CONTRAST TECHNIQUE: Multidetector CT imaging of the abdomen and pelvis was performed following the standard protocol without IV contrast. COMPARISON:  05/26/2007 FINDINGS: Lower chest: The lung bases are unremarkable. Hepatobiliary: Unenhanced liver shows no biliary ductal dilatation. No calcified gallstones are noted within gallbladder. Pancreas: Unenhanced pancreas is unremarkable. Spleen: Unenhanced spleen is unremarkable. Adrenals/Urinary Tract: No adrenal gland mass. There is bilateral nonobstructive nephrolithiasis. Largest calcified nonobstructive calculus midpole of the right kidney measures 6 mm. Largest nonobstructive calculus lower pole of the left kidney measures 5 mm. There is mild right hydronephrosis and right hydroureter. Axial image 77 there is calcified obstructive calculus in distal right ureter measures 6 mm at the level of inferior aspect of the right SI joint. The urinary bladder is empty limiting its assessment. Stomach/Bowel: There is no small bowel obstruction. Normal appendix. No pericecal inflammation. Normal appendix. No colitis or diverticulitis. Moderate stool noted  within rectum. Vascular/Lymphatic: No aortic aneurysm. No retroperitoneal or mesenteric adenopathy. Reproductive: The uterus is anteflexed. IUD noted within uterus. No adnexal mass. Other: No ascites or free abdominal air.  No inguinal adenopathy. Musculoskeletal: No destructive bony lesions are noted. Sagittal images of the spine shows mild degenerative changes lower thoracic spine. IMPRESSION: 1. There is bilateral nonobstructive nephrolithiasis. 2. Mild right hydronephrosis and right hydroureter. There is 6 mm calcified obstructive calculus in distal right ureter at the level of inferior aspect of right SI joint. Mild right periureteral stranding. 3. No pericecal inflammation.  Normal appendix. 4. No colitis or diverticulitis. 5. No small bowel obstruction. 6. IUD within anteflexed uterus. Electronically Signed   By: Natasha MeadLiviu  Pop M.D.   On: 02/10/2016 09:43    Procedures Procedures (including critical care time)  Medications Ordered in ED Medications  fentaNYL (SUBLIMAZE) injection 50 mcg (50 mcg Intravenous Given 02/10/16 0859)  ondansetron (ZOFRAN) injection 4 mg (4 mg Intravenous Given 02/10/16 0859)  cefTRIAXone (ROCEPHIN) 1  g in dextrose 5 % 50 mL IVPB (not administered)     Initial Impression / Assessment and Plan / ED Course  I have reviewed the triage vital signs and the nursing notes.  Pertinent labs & imaging results that were available during my care of the patient were reviewed by me and considered in my medical decision making (see chart for details).  Clinical Course    45 y.o. female presents with Right-sided flank pain that has progressed to right lower quadrant abdominal pain for the last week. It has been intermittent but since yesterday evening and has been constant and unbearable. Pain has been refractory to home hydrocodone therapy. She is mildly tender in the right lower quadrant but appears uncomfortable. She has history of multiple previous large stones requiring  intervention and one time previously had sepsis following stent placement.  On today's workup she has small leukocytes and many bacteria in her urine, slightly declined renal function from baseline, and has a 6 mm obstructive stone with moderate Hydro on the right consistent with her symptoms. Given her history she is potentially high risk for infected stone although she does not have an elevation of white blood cell count or vital sign abnormalities currently.  Urology was consulted and I spoke with Dr Berneice Heinrich who recommended the patient have cultures drawn, antibiotics started and be transferred to [facility] for definitive evaluation by their team. Dr. Erma Heritage accepted the patient to the [facility] emergency department with plan to consult urology immediately on arrival.  Final Clinical Impressions(s) / ED Diagnoses   Final diagnoses:  Right nephrolithiasis  Hydronephrosis, right  Urinary tract infection with hematuria, site unspecified    New Prescriptions New Prescriptions   No medications on file     Lyndal Pulley, MD 02/10/16 1021

## 2016-02-10 NOTE — Brief Op Note (Signed)
02/10/2016  6:46 PM  PATIENT:  Festus HoltsPaula M Delatorre  45 y.o. female  PRE-OPERATIVE DIAGNOSIS:  BILATERAL NEPHROLITHIASIS, BACTERURIA  POST-OPERATIVE DIAGNOSIS:  BILATERAL NEPHROLITHIASIS, BACTERURIA  PROCEDURE:  Procedure(s): CYSTOSCOPY WITH RETROGRADE PYELOGRAM/BILATERAL URETERAL STENT PLACEMENT (Bilateral)  SURGEON:  Surgeon(s) and Role:    * Sebastian Acheheodore Serayah Yazdani, MD - Primary  PHYSICIAN ASSISTANT:   ASSISTANTS: none   ANESTHESIA:   general  EBL:  No intake/output data recorded.  BLOOD ADMINISTERED:none  DRAINS: none   LOCAL MEDICATIONS USED:  NONE  SPECIMEN:  No Specimen  DISPOSITION OF SPECIMEN:  N/A  COUNTS:  YES  TOURNIQUET:  * No tourniquets in log *  DICTATION: .Other Dictation: Dictation Number no number given, dication finished 11/7/17at 1850  PLAN OF CARE: Admit for overnight observation  PATIENT DISPOSITION:  PACU - hemodynamically stable.   Delay start of Pharmacological VTE agent (>24hrs) due to surgical blood loss or risk of bleeding: yes

## 2016-02-10 NOTE — ED Notes (Signed)
[facility].

## 2016-02-10 NOTE — Transfer of Care (Signed)
Immediate Anesthesia Transfer of Care Note  Patient: Rebecca [name] M Kosak  Procedure(s) Performed: Procedure(s): CYSTOSCOPY WITH RETROGRADE PYELOGRAM/BILATERAL URETERAL STENT PLACEMENT (Bilateral)  Patient Location: PACU  Anesthesia Type:General  Level of Consciousness:  sedated, patient cooperative and responds to stimulation  Airway & Oxygen Therapy:Patient Spontanous Breathing and Patient connected to face mask oxgen  Post-op Assessment:  Report given to PACU RN and Post -op Vital signs reviewed and stable  Post vital signs:  Reviewed and stable  Last Vitals:  Vitals:   02/10/16 1537 02/10/16 1631  BP: 147/98 121/60  Pulse: 86 94  Resp: 18 17  Temp:      Complications: No apparent anesthesia complications

## 2016-02-10 NOTE — Anesthesia Procedure Notes (Signed)
Procedure Name: Intubation Date/Time: 02/10/2016 6:26 PM Performed by: Orest DikesPETERS, Haliey Romberg J Pre-anesthesia Checklist: Patient identified, Emergency Drugs available, Suction available and Patient being monitored Patient Re-evaluated:Patient Re-evaluated prior to inductionOxygen Delivery Method: Circle system utilized Preoxygenation: Pre-oxygenation with 100% oxygen Intubation Type: IV induction Ventilation: Mask ventilation without difficulty Laryngoscope Size: Mac and 4 Grade View: Grade I Tube type: Oral Tube size: 7.5 mm Number of attempts: 1 Airway Equipment and Method: Stylet and Oral airway Placement Confirmation: ETT inserted through vocal cords under direct vision,  positive ETCO2 and breath sounds checked- equal and bilateral Secured at: 21 cm Tube secured with: Tape Dental Injury: Teeth and Oropharynx as per pre-operative assessment

## 2016-02-10 NOTE — ED Notes (Signed)
Urologist at bedside.

## 2016-02-10 NOTE — ED Notes (Signed)
Dr. Berneice HeinrichManny with urology called back and he will come see patient later today.

## 2016-02-10 NOTE — Progress Notes (Signed)
Pharmacy Antibiotic Note  Rebecca [name] is a 45 y.o. female admitted on 02/10/2016 with recurrent nephrolithiasis and bacteruria.  Pharmacy has been consulted for antibiotic renal dosing.  Plan:  Increase to ceftriaxone 2g IV q24h  Dosage remains stable and need for further dosage adjustment appears unlikely at present.  Pharmacy will sign off at this time.  Please reconsult if a change in clinical status warrants re-evaluation of dosage.   Height: 5\' 5"  (165.1 cm) Weight: (!) 325 lb (147.4 kg) IBW/kg (Calculated) : 57  Temp (24hrs), Avg:98.1 F (36.7 C), Min:98.1 F (36.7 C), Max:98.2 F (36.8 C)   Recent Labs Lab 02/10/16 0850  WBC 7.2  CREATININE 1.41*    Estimated Creatinine Clearance: 74.9 mL/min (by C-G formula based on SCr of 1.41 mg/dL (H)).    No Known Allergies  Antimicrobials this admission: 11/7 Ceftriaxone >>   Dose adjustments this admission:   Microbiology results: 11/7 BCx: sent 11/7 UCx: sent   Thank you for allowing pharmacy to be a part of this patient's care.  Lynann Beaverhristine Siani Utke PharmD, BCPS Pager [phone] 02/10/2016 10:02 PM

## 2016-02-10 NOTE — ED Notes (Signed)
[facility]

## 2016-02-10 NOTE — ED Notes (Signed)
Report given to Mike EMT-P with [facility]. 

## 2016-02-10 NOTE — Anesthesia Postprocedure Evaluation (Signed)
Anesthesia Post Note  Patient: Rebecca [name]  Procedure(s) Performed: Procedure(s) (LRB): CYSTOSCOPY WITH RETROGRADE PYELOGRAM/BILATERAL URETERAL STENT PLACEMENT (Bilateral)  Patient location during evaluation: PACU Anesthesia Type: General Level of consciousness: awake and alert Pain management: pain level controlled Vital Signs Assessment: post-procedure vital signs reviewed and stable Respiratory status: spontaneous breathing, nonlabored ventilation, respiratory function stable and patient connected to nasal cannula oxygen Cardiovascular status: blood pressure returned to baseline and stable Postop Assessment: no signs of nausea or vomiting Anesthetic complications: no    Last Vitals:  Vitals:   02/10/16 1930 02/10/16 1945  BP: 100/67 116/68  Pulse: 88 92  Resp: 19 (!) 22  Temp:  36.7 C    Last Pain:  Vitals:   02/10/16 1945  PainSc: 2                  Damarea Merkel S

## 2016-02-10 NOTE — Anesthesia Preprocedure Evaluation (Signed)
Anesthesia Evaluation  Patient identified by MRN, date of birth, ID band Patient awake    Reviewed: Allergy & Precautions, NPO status , Patient's Chart, lab work & pertinent test results  Airway Mallampati: II  TM Distance: <3 FB Neck ROM: Full    Dental no notable dental hx.    Pulmonary neg pulmonary ROS,    Pulmonary exam normal breath sounds clear to auscultation       Cardiovascular hypertension, Normal cardiovascular exam Rhythm:Regular Rate:Normal     Neuro/Psych negative neurological ROS  negative psych ROS   GI/Hepatic negative GI ROS, Neg liver ROS,   Endo/Other  Hypothyroidism Morbid obesity  Renal/GU negative Renal ROS  negative genitourinary   Musculoskeletal negative musculoskeletal ROS (+)   Abdominal (+) + obese,   Peds negative pediatric ROS (+)  Hematology negative hematology ROS (+)   Anesthesia Other Findings   Reproductive/Obstetrics negative OB ROS                             Anesthesia Physical Anesthesia Plan  ASA: III  Anesthesia Plan: General   Post-op Pain Management:    Induction: Intravenous  Airway Management Planned: Oral ETT  Additional Equipment:   Intra-op Plan:   Post-operative Plan: Extubation in OR  Informed Consent: I have reviewed the patients History and Physical, chart, labs and discussed the procedure including the risks, benefits and alternatives for the proposed anesthesia with the patient or authorized representative who has indicated his/her understanding and acceptance.   Dental advisory given  Plan Discussed with: CRNA and Surgeon  Anesthesia Plan Comments:         Anesthesia Quick Evaluation

## 2016-02-11 ENCOUNTER — Encounter (HOSPITAL_COMMUNITY): Payer: Self-pay | Admitting: Urology

## 2016-02-11 DIAGNOSIS — R8271 Bacteriuria: Secondary | ICD-10-CM | POA: Diagnosis not present

## 2016-02-11 DIAGNOSIS — N202 Calculus of kidney with calculus of ureter: Secondary | ICD-10-CM | POA: Diagnosis not present

## 2016-02-11 LAB — URINE CULTURE

## 2016-02-11 MED ORDER — SENNOSIDES-DOCUSATE SODIUM 8.6-50 MG PO TABS: 1.0000 | ORAL_TABLET | Freq: Two times a day (BID) | ORAL | 0 refills | Status: DC

## 2016-02-11 MED ORDER — OXYCODONE-ACETAMINOPHEN 5-325 MG PO TABS: 1.0000 | ORAL_TABLET | Freq: Four times a day (QID) | ORAL | 0 refills | Status: DC | PRN

## 2016-02-11 MED ORDER — SULFAMETHOXAZOLE-TRIMETHOPRIM 800-160 MG PO TABS: 1.0000 | ORAL_TABLET | Freq: Two times a day (BID) | ORAL | 0 refills | Status: DC

## 2016-02-11 NOTE — Discharge Instructions (Signed)
1 - You may have urinary urgency (bladder spasms) and bloody urine on / off with stent in place. This is normal. ° °2 - Call MD or go to ER for fever >102, severe pain / nausea / vomiting not relieved by medications, or acute change in medical status ° °

## 2016-02-11 NOTE — Care Management Note (Signed)
Case Management Note  Patient Details  Name: Festus Holtsaula M Fiebig MRN: [id number] Date of Birth: 09/24/1970  Subjective/Objective: 45 y/o f admitted w/R ureteral stone. From home.                   Action/Plan:d/c plan home.   Expected Discharge Date:   (unknown)               Expected Discharge Plan:  Home/Self Care  In-House Referral:     Discharge planning Services  CM Consult  Post Acute Care Choice:    Choice offered to:     DME Arranged:    DME Agency:     HH Arranged:    HH Agency:     Status of Service:  In process, will continue to follow  If discussed at Long Length of Stay Meetings, dates discussed:    Additional Comments:  Lanier ClamMahabir, Jennings Stirling, RN 02/11/2016, 3:05 PM

## 2016-02-11 NOTE — Op Note (Signed)
[name], Sarya                 ACCOUNT NO.:  [id number]  MEDICAL RECORD NO.:  [id number]  LOCATION:  1402                         [facility]  PHYSICIAN:  Sebastian Acheheodore Areanna Gengler, MD     DATE OF BIRTH:  1971/01/10  DATE OF PROCEDURE: 02/10/2016                              OPERATIVE REPORT   DIAGNOSES:  Right ureteral and bilateral renal stones, history of recurrent nephrolithiasis, and history of prior urosepsis.  PROCEDURE: 1. Cystoscopy with bilateral retrograde pyelogram with interpretation. 2. Insertion of bilateral ureteral stents, 6 x 24, Contour, no tether.  ESTIMATED BLOOD LOSS:  Nil.  COMPLICATIONS:  None.  SPECIMENS:  None.  FINDINGS: 1. Very mild right hydronephrosis consistent with distal obstruction. 2. Successful placement of right ureteral stent, proximal in renal     pelvis and distal in urinary bladder. 3. Successful placement of left ureteral stent, proximal in renal     pelvis and distal in urinary bladder.  INDICATION:  Rebecca [name] is a pleasant 45 year old lady with history of morbid obesity and significant recurrent urolithiasis who was found on workup of colicky right flank pain to have a right distal ureteral stone.  She also has significant bacteriuria and although she was afebrile, tachycardic and history of prior urosepsis with similar episode and it was felt that urgent renal decompression with stenting would be warranted with management of her stones in a delayed fashion as a staged procedure will be the safest way to proceed.  [facility], seen and evaluated and agreed with plan.  Informed consent was obtained and placed in the medical record.  PROCEDURE IN DETAIL:  The patient being Leighton RoachPaula Michelle Piccininni verified. Procedure being bilateral ureteral stent placement was confirmed. Procedure was carried out.  Time-out was performed.  Intravenous antibiotics were administered.  General anesthesia introduced.   The patient was placed into a low lithotomy position.  Sterile field was created by prepping and draping the patient's vagina, introitus, and proximal thighs using iodine x3.  Next, cystourethroscopy was performed using a rigid cystoscope with offset lens.  Inspection of urinary bladder revealed no diverticula, calcifications, or papillary lesions. Ureteral orifices appeared singleton bilaterally.  The right ureteral orifice was cannulated with a 6-French end-hole catheter and a right retrograde pyelogram was obtained.  Right retrograde pyelogram demonstrated a single right ureter with single-system right kidney.  There was a filling defect in distal ureter consistent with known stone.  There was mild hydroureteronephrosis above this.  A 0.038 Sensor wire was advanced to the level of the upper pole, over which a new 6 x 24 Contour-type stent was placed using cystoscopic and fluoroscopic guidance.  Good proximal and distal deployment were noted.  Similarly, left retrograde pyelogram was obtained.  Left retrograde pyelogram demonstrated single left ureter with single- system left kidney.  No obvious filling defects or narrowing noted.  The Sensor wire was once again advanced to the upper pole.  A new 6 x 24 Contour-type stent was placed on the left side.  Good proximal and distal deployment were noted.  Bladder was emptied per cystoscope. Procedure then terminated.  The patient tolerated the procedure well. There were  no immediate periprocedural complications.  The patient was taken to the postanesthesia care unit in a stable condition with plan for observation admission overnight to verify no worsening of infectious parameters prior to discharge.          ______________________________ Sebastian Acheheodore Torie Towle, MD     TM/MEDQ  D:  02/10/2016  T:  02/11/2016  Job:  [id number]

## 2016-02-11 NOTE — Discharge Summary (Signed)
Physician Discharge Summary  Patient ID: Rebecca [name] MRN: [id number] DOB/AGE: 44/06/1970 44 y.o.  Admit date: 02/10/2016 Discharge date: 02/11/2016  Admission Diagnoses: Right Ureteral, Bilateral Renal Stones with Bacteruria  Discharge Diagnoses:  Active Problems:   Right ureteral stone   Discharged Condition: good  Hospital Course:   1 - Recurrent Nephrolithiasis - pt underwent urgent cysto and bilateral ureteral stent placement on 11/7, the day of admission, without acute complications for renal decompression in setting of obstructing right ureteral stone and significant baceruturia.   2- Bacteruria - bacteruria on ER UA at presentation. H/o prior urosepsis at times of stones per report. Treated with IV Rocephing while in house and remained afebrile. UCX 11/7 non-clonal suggesting likely colonization, not infection. She will be discahrged on bactrim.  By the afternoon of POD 1 she is ambulatory, pain controlled on PO meds, maintaining PO hydration, afebrile x 24 hours, and felt to be adequate for discharge.    Consults: None  Significant Diagnostic Studies: labs: as per above  Treatments: surgery:  cysto and bilateral ureteral stent placement on 02/10/16  Discharge Exam: Blood pressure (!) 148/78, pulse 85, temperature 97.5 F (36.4 C), temperature source Oral, resp. rate 20, height 5\' 5"  (1.651 m), weight (!) 147.9 kg (326 lb), SpO2 100 %. General appearance: alert, cooperative and appears stated age Eyes: negative Nose: Nares normal. Septum midline. Mucosa normal. No drainage or sinus tenderness. Throat: lips, mucosa, and tongue normal; teeth and gums normal Neck: supple, symmetrical, trachea midline Back: symmetric, no curvature. ROM normal. No CVA tenderness. Resp: non-labored on room air.  Cardio: Nl rate GI: soft, non-tender; bowel sounds normal; no masses,  no organomegaly and obesity limits sensitivity of exam.  Pelvic: external genitalia normal and no foley.   Extremities: extremities normal, atraumatic, no cyanosis or edema Pulses: 2+ and symmetric Lymph nodes: Cervical, supraclavicular, and axillary nodes normal. Neurologic: Grossly normal  Disposition: 01-Home or Self Care     Medication List    STOP taking these medications   HYDROcodone-acetaminophen 7.5-325 MG tablet Commonly known as:  NORCO     TAKE these medications   amphetamine-dextroamphetamine 10 MG tablet Commonly known as:  ADDERALL Take 10 mg by mouth 2 (two) times daily as needed (adhd).   levothyroxine 175 MCG tablet Commonly known as:  SYNTHROID, LEVOTHROID Take 175 mcg by mouth daily before breakfast.   lisinopril 40 MG tablet Commonly known as:  PRINIVIL,ZESTRIL Take 40 mg by mouth daily.   oxyCODONE-acetaminophen 5-325 MG tablet Commonly known as:  ROXICET Take 1-2 tablets by mouth every 6 (six) hours as needed for severe pain. Post-operatively   senna-docusate 8.6-50 MG tablet Commonly known as:  Senokot-S Take 1 tablet by mouth 2 (two) times daily. While taking strong pain meds to prevent constipation.   sulfamethoxazole-trimethoprim 800-160 MG tablet Commonly known as:  BACTRIM DS,SEPTRA DS Take 1 tablet by mouth 2 (two) times daily. X 10 days now. Also begin 2 days before next Urology surgery.      Follow-up Information    Sebastian AcheMANNY, Katrell Milhorn, MD Follow up.   Specialty:  Urology Why:  Office will call to schedule bilateral ureteroscopy in several weeks.  Contact information: [address] [place] [address] [phone]        Sebastian AcheMANNY, Leveta Wahab, MD .   Specialty:  Urology Contact information: [address] [place] [address] [phone]           Signed: Sebastian AcheMANNY, Keili Hasten 02/11/2016, 5:00 PM

## 2016-02-13 ENCOUNTER — Other Ambulatory Visit: Payer: Self-pay | Admitting: Urology

## 2016-02-15 LAB — CULTURE, BLOOD (ROUTINE X 2)
Culture: NO GROWTH
Culture: NO GROWTH

## 2016-02-23 ENCOUNTER — Other Ambulatory Visit: Payer: Self-pay | Admitting: Urology

## 2016-03-02 NOTE — Patient Instructions (Addendum)
Festus Holtsaula M Schwebach  03/02/2016   Your procedure is scheduled on: 03-10-16  [facility]  [facility] 3rd floor to  Short Stay Center at 1100 AM.  Call this number if you have problems the morning of surgery [phone]   Remember: ONLY 1 PERSON MAY GO WITH YOU TO SHORT STAY TO GET  READY MORNING OF YOUR SURGERY.    Do not eat food  :After Midnight, CLEAR LIQUIDS FROM MIDNIGHT UNTIL 700 AM DAY OF SURGERY, NOTHING BY MOUTH AFTER 700 AM DAY OF SURGERY.      Take these medicines the morning of surgery with A SIP OF WATER:LEVOTHRYOXINE (SYNTHROID)                                You may not have any metal on your body including hair pins and              piercings  Do not wear jewelry, make-up, lotions, powders or perfumes, deodorant             Do not wear nail polish.  Do not shave  48 hours prior to surgery.              Men may shave face and neck.   Do not bring valuables to the hospital. [facility] IS NOT             RESPONSIBLE   FOR VALUABLES.  Contacts, dentures or bridgework may not be worn into surgery.  Leave suitcase in the car. After surgery it may be brought to your room.     Patients discharged the day of surgery will not be allowed to drive home.  Name and phone number of your driver:friend- Amie  Special Instructions: N/A              Please read over the following fact sheets you were given: _____________________________________________________________________                CLEAR LIQUID DIET   Foods Allowed                                                                     Foods Excluded  Coffee and tea, regular and decaf                             liquids that you cannot  Plain Jell-O in any flavor                                             see through such as: Fruit ices (not with fruit pulp)                                     milk, soups, orange juice  Iced Popsicles  All solid food Carbonated beverages, regular and diet                                    Cranberry, grape and apple juices Sports drinks like Gatorade Lightly seasoned clear broth or consume(fat free) Sugar, honey syrup  Sample Menu Breakfast                                Lunch                                     Supper Cranberry juice                    Beef broth                            Chicken broth Jell-O                                     Grape juice                           Apple juice Coffee or tea                        Jell-O                                      Popsicle                                                Coffee or tea                        Coffee or tea  _____________________________________________________________________  [facility] - Preparing for Surgery Before surgery, you can play an important role.  Because skin is not sterile, your skin needs to be as free of germs as possible.  You can reduce the number of germs on your skin by washing with CHG (chlorahexidine gluconate) soap before surgery.  CHG is an antiseptic cleaner which kills germs and bonds with the skin to continue killing germs even after washing. Please DO NOT use if you have an allergy to CHG or antibacterial soaps.  If your skin becomes reddened/irritated stop using the CHG and inform your nurse when you arrive at Short Stay. Do not shave (including legs and underarms) for at least 48 hours prior to the first CHG shower.  You may shave your face/neck. Please follow these instructions carefully:  1.  Shower with CHG Soap the night before surgery and the  morning of Surgery.  2.  If you choose to wash your hair, wash your hair first as usual with your  normal  shampoo.  3.  After you shampoo, rinse your hair and body thoroughly to remove the  shampoo.  4.  Use CHG as you would any other liquid soap.  You can apply chg directly  to the skin and wash                        Gently with a scrungie or clean washcloth.  5.  Apply the CHG Soap to your body ONLY FROM THE NECK DOWN.   Do not use on face/ open                           Wound or open sores. Avoid contact with eyes, ears mouth and genitals (private parts).                       Wash face,  Genitals (private parts) with your normal soap.             6.  Wash thoroughly, paying special attention to the area where your surgery  will be performed.  7.  Thoroughly rinse your body with warm water from the neck down.  8.  DO NOT shower/wash with your normal soap after using and rinsing off  the CHG Soap.                9.  Pat yourself dry with a clean towel.            10.  Wear clean pajamas.            11.  Place clean sheets on your bed the night of your first shower and do not  sleep with pets. Day of Surgery : Do not apply any lotions/deodorants the morning of surgery.  Please wear clean clothes to the hospital/surgery center.  FAILURE TO FOLLOW THESE INSTRUCTIONS MAY RESULT IN THE CANCELLATION OF YOUR SURGERY PATIENT SIGNATURE_________________________________  NURSE SIGNATURE__________________________________  ________________________________________________________________________

## 2016-03-03 ENCOUNTER — Encounter (HOSPITAL_COMMUNITY)
Admission: RE | Admit: 2016-03-03 | Discharge: 2016-03-03 | Disposition: A | Discharge status: Home / Self Care | Payer: BLUE CROSS/BLUE SHIELD | Source: Ambulatory Visit | Attending: Urology | Admitting: Urology

## 2016-03-03 ENCOUNTER — Encounter (HOSPITAL_COMMUNITY): Payer: Self-pay

## 2016-03-03 DIAGNOSIS — Z01810 Encounter for preprocedural cardiovascular examination: Secondary | ICD-10-CM | POA: Insufficient documentation

## 2016-03-03 DIAGNOSIS — I10 Essential (primary) hypertension: Secondary | ICD-10-CM | POA: Diagnosis not present

## 2016-03-03 DIAGNOSIS — Z01812 Encounter for preprocedural laboratory examination: Secondary | ICD-10-CM | POA: Insufficient documentation

## 2016-03-03 HISTORY — DX: Personal history of urinary calculi: Z87.442

## 2016-03-03 LAB — BASIC METABOLIC PANEL
ANION GAP: 7 (ref 5–15)
BUN: 16 mg/dL (ref 6–20)
CALCIUM: 8.7 mg/dL — AB (ref 8.9–10.3)
CHLORIDE: 109 mmol/L (ref 101–111)
CO2: 24 mmol/L (ref 22–32)
CREATININE: 1.04 mg/dL — AB (ref 0.44–1.00)
GFR calc non Af Amer: >60 mL/min (ref >60)
Glucose, Bld: 101 mg/dL — ABNORMAL HIGH (ref 65–99)
Potassium: 4 mmol/L (ref 3.5–5.1)
SODIUM: 140 mmol/L (ref 135–145)

## 2016-03-03 LAB — CBC
HCT: 43.1 % (ref 36.0–46.0)
Hemoglobin: 14.4 g/dL (ref 12.0–15.0)
MCH: 30.4 pg (ref 26.0–34.0)
MCHC: 33.4 g/dL (ref 30.0–36.0)
MCV: 90.9 fL (ref 78.0–100.0)
PLATELETS: 309 10*3/uL (ref 150–400)
RBC: 4.74 MIL/uL (ref 3.87–5.11)
RDW: 13.5 % (ref 11.5–15.5)
WBC: 6.6 10*3/uL (ref 4.0–10.5)

## 2016-03-05 DIAGNOSIS — E039 Hypothyroidism, unspecified: Secondary | ICD-10-CM | POA: Diagnosis not present

## 2016-03-05 DIAGNOSIS — F900 Attention-deficit hyperactivity disorder, predominantly inattentive type: Secondary | ICD-10-CM | POA: Diagnosis not present

## 2016-03-05 DIAGNOSIS — I10 Essential (primary) hypertension: Secondary | ICD-10-CM | POA: Diagnosis not present

## 2016-03-05 DIAGNOSIS — R740 Nonspecific elevation of levels of transaminase and lactic acid dehydrogenase [LDH]: Secondary | ICD-10-CM | POA: Diagnosis not present

## 2016-03-05 DIAGNOSIS — N200 Calculus of kidney: Secondary | ICD-10-CM | POA: Diagnosis not present

## 2016-03-05 DIAGNOSIS — D72829 Elevated white blood cell count, unspecified: Secondary | ICD-10-CM | POA: Diagnosis not present

## 2016-03-09 MED ORDER — GENTAMICIN SULFATE 40 MG/ML IJ SOLN
5.0000 mg/kg | INTRAMUSCULAR | Status: AC
  Administered 2016-03-10: 470 mg via INTRAVENOUS
  Filled 2016-03-09: qty 11.75

## 2016-03-10 ENCOUNTER — Ambulatory Visit (HOSPITAL_COMMUNITY)
Admission: RE | Admit: 2016-03-10 | Discharge: 2016-03-10 | Disposition: A | Discharge status: Home / Self Care | Payer: BLUE CROSS/BLUE SHIELD | Source: Ambulatory Visit | Attending: Urology | Admitting: Urology

## 2016-03-10 ENCOUNTER — Encounter (HOSPITAL_COMMUNITY)
Admission: RE | Disposition: A | Discharge status: Home / Self Care | Payer: Self-pay | Source: Ambulatory Visit | Attending: Urology

## 2016-03-10 ENCOUNTER — Ambulatory Visit (HOSPITAL_COMMUNITY): Payer: BLUE CROSS/BLUE SHIELD | Admitting: Anesthesiology

## 2016-03-10 ENCOUNTER — Encounter (HOSPITAL_COMMUNITY): Payer: Self-pay | Admitting: *Deleted

## 2016-03-10 DIAGNOSIS — Z6841 Body Mass Index (BMI) 40.0 and over, adult: Secondary | ICD-10-CM | POA: Insufficient documentation

## 2016-03-10 DIAGNOSIS — M25579 Pain in unspecified ankle and joints of unspecified foot: Secondary | ICD-10-CM | POA: Diagnosis not present

## 2016-03-10 DIAGNOSIS — G5600 Carpal tunnel syndrome, unspecified upper limb: Secondary | ICD-10-CM | POA: Diagnosis not present

## 2016-03-10 DIAGNOSIS — Z79899 Other long term (current) drug therapy: Secondary | ICD-10-CM | POA: Diagnosis not present

## 2016-03-10 DIAGNOSIS — N202 Calculus of kidney with calculus of ureter: Secondary | ICD-10-CM | POA: Diagnosis not present

## 2016-03-10 DIAGNOSIS — I10 Essential (primary) hypertension: Secondary | ICD-10-CM | POA: Insufficient documentation

## 2016-03-10 DIAGNOSIS — E039 Hypothyroidism, unspecified: Secondary | ICD-10-CM | POA: Diagnosis not present

## 2016-03-10 DIAGNOSIS — S93409A Sprain of unspecified ligament of unspecified ankle, initial encounter: Secondary | ICD-10-CM | POA: Diagnosis not present

## 2016-03-10 HISTORY — PX: CYSTOSCOPY/URETEROSCOPY/HOLMIUM LASER/STENT PLACEMENT: SHX6546

## 2016-03-10 SURGERY — CYSTOSCOPY/URETEROSCOPY/HOLMIUM LASER/STENT PLACEMENT
Anesthesia: General | Laterality: Bilateral

## 2016-03-10 MED ORDER — LIDOCAINE 2% (20 MG/ML) 5 ML SYRINGE
INTRAMUSCULAR | Status: AC
  Filled 2016-03-10: qty 5

## 2016-03-10 MED ORDER — PROPOFOL 10 MG/ML IV BOLUS
INTRAVENOUS | Status: DC | PRN
  Administered 2016-03-10: 250 mg via INTRAVENOUS
  Administered 2016-03-10: 50 mg via INTRAVENOUS

## 2016-03-10 MED ORDER — ROCURONIUM BROMIDE 50 MG/5ML IV SOSY
PREFILLED_SYRINGE | INTRAVENOUS | Status: AC
  Filled 2016-03-10: qty 5

## 2016-03-10 MED ORDER — IOPAMIDOL (ISOVUE-300) INJECTION 61%
INTRAVENOUS | Status: DC | PRN
Start: 2016-03-10 — End: 2016-03-10
  Administered 2016-03-10: 40 mL

## 2016-03-10 MED ORDER — PROMETHAZINE HCL 25 MG/ML IJ SOLN: 6.2500 mg | INTRAMUSCULAR | Status: DC | PRN

## 2016-03-10 MED ORDER — DEXAMETHASONE SODIUM PHOSPHATE 10 MG/ML IJ SOLN
INTRAMUSCULAR | Status: DC | PRN
  Administered 2016-03-10: 10 mg via INTRAVENOUS

## 2016-03-10 MED ORDER — SUCCINYLCHOLINE CHLORIDE 200 MG/10ML IV SOSY
PREFILLED_SYRINGE | INTRAVENOUS | Status: AC
  Filled 2016-03-10: qty 10

## 2016-03-10 MED ORDER — FENTANYL CITRATE (PF) 100 MCG/2ML IJ SOLN
INTRAMUSCULAR | Status: DC | PRN
  Administered 2016-03-10 (×2): 50 ug via INTRAVENOUS

## 2016-03-10 MED ORDER — SODIUM CHLORIDE 0.9 % IR SOLN
Status: DC | PRN
  Administered 2016-03-10: 1000 mL

## 2016-03-10 MED ORDER — LIDOCAINE 2% (20 MG/ML) 5 ML SYRINGE
INTRAMUSCULAR | Status: DC | PRN
  Administered 2016-03-10: 100 mg via INTRAVENOUS

## 2016-03-10 MED ORDER — PHENYLEPHRINE 40 MCG/ML (10ML) SYRINGE FOR IV PUSH (FOR BLOOD PRESSURE SUPPORT)
PREFILLED_SYRINGE | INTRAVENOUS | Status: AC
  Filled 2016-03-10: qty 10

## 2016-03-10 MED ORDER — SUCCINYLCHOLINE CHLORIDE 200 MG/10ML IV SOSY
PREFILLED_SYRINGE | INTRAVENOUS | Status: DC | PRN
  Administered 2016-03-10: 120 mg via INTRAVENOUS

## 2016-03-10 MED ORDER — OXYCODONE-ACETAMINOPHEN 5-325 MG PO TABS: 1.0000 | ORAL_TABLET | Freq: Four times a day (QID) | ORAL | 0 refills | Status: DC | PRN

## 2016-03-10 MED ORDER — FENTANYL CITRATE (PF) 100 MCG/2ML IJ SOLN
INTRAMUSCULAR | Status: AC
  Filled 2016-03-10: qty 2

## 2016-03-10 MED ORDER — DEXAMETHASONE SODIUM PHOSPHATE 10 MG/ML IJ SOLN
INTRAMUSCULAR | Status: AC
  Filled 2016-03-10: qty 1

## 2016-03-10 MED ORDER — MIDAZOLAM HCL 2 MG/2ML IJ SOLN
INTRAMUSCULAR | Status: AC
  Filled 2016-03-10: qty 2

## 2016-03-10 MED ORDER — PHENYLEPHRINE HCL 10 MG/ML IJ SOLN
INTRAMUSCULAR | Status: DC | PRN
  Administered 2016-03-10 (×2): 80 ug via INTRAVENOUS

## 2016-03-10 MED ORDER — 0.9 % SODIUM CHLORIDE (POUR BTL) OPTIME
TOPICAL | Status: DC | PRN
  Administered 2016-03-10: 1000 mL

## 2016-03-10 MED ORDER — HYDROMORPHONE HCL 1 MG/ML IJ SOLN: 0.2500 mg | INTRAMUSCULAR | Status: DC | PRN

## 2016-03-10 MED ORDER — OXYCODONE-ACETAMINOPHEN 5-325 MG PO TABS
1.0000 | ORAL_TABLET | Freq: Four times a day (QID) | ORAL | Status: DC | PRN
  Administered 2016-03-10: 1 via ORAL
  Filled 2016-03-10: qty 1

## 2016-03-10 MED ORDER — SULFAMETHOXAZOLE-TRIMETHOPRIM 800-160 MG PO TABS: 1.0000 | ORAL_TABLET | Freq: Two times a day (BID) | ORAL | 0 refills | Status: DC

## 2016-03-10 MED ORDER — LACTATED RINGERS IV SOLN
INTRAVENOUS | Status: DC
  Administered 2016-03-10: 15:00:00 via INTRAVENOUS
  Administered 2016-03-10: 1000 mL via INTRAVENOUS

## 2016-03-10 MED ORDER — SODIUM CHLORIDE 0.9 % IR SOLN
Status: DC | PRN
  Administered 2016-03-10: 3000 mL

## 2016-03-10 MED ORDER — PROPOFOL 10 MG/ML IV BOLUS
INTRAVENOUS | Status: AC
Start: 2016-03-10 — End: 2016-03-10
  Filled 2016-03-10: qty 40

## 2016-03-10 MED ORDER — ONDANSETRON HCL 4 MG/2ML IJ SOLN
INTRAMUSCULAR | Status: AC
  Filled 2016-03-10: qty 2

## 2016-03-10 MED ORDER — MIDAZOLAM HCL 5 MG/5ML IJ SOLN
INTRAMUSCULAR | Status: DC | PRN
  Administered 2016-03-10: 2 mg via INTRAVENOUS

## 2016-03-10 SURGICAL SUPPLY — 21 items
BAG URO CATCHER STRL LF (MISCELLANEOUS) ×2 IMPLANT
BASKET LASER NITINOL 1.9FR (BASKET) ×2 IMPLANT
CATH INTERMIT  6FR 70CM (CATHETERS) ×2 IMPLANT
CLOTH BEACON ORANGE TIMEOUT ST (SAFETY) ×2 IMPLANT
FIBER LASER FLEXIVA 1000 (UROLOGICAL SUPPLIES) IMPLANT
FIBER LASER FLEXIVA 365 (UROLOGICAL SUPPLIES) IMPLANT
FIBER LASER FLEXIVA 550 (UROLOGICAL SUPPLIES) IMPLANT
FIBER LASER TRAC TIP (UROLOGICAL SUPPLIES) ×2 IMPLANT
GLOVE BIOGEL M STRL SZ7.5 (GLOVE) ×2 IMPLANT
GOWN STRL REUS W/TWL LRG LVL3 (GOWN DISPOSABLE) ×4 IMPLANT
GUIDEWIRE ANG ZIPWIRE 038X150 (WIRE) ×2 IMPLANT
GUIDEWIRE STR DUAL SENSOR (WIRE) ×2 IMPLANT
IV NS 1000ML (IV SOLUTION) ×1
IV NS 1000ML BAXH (IV SOLUTION) ×1 IMPLANT
MANIFOLD NEPTUNE II (INSTRUMENTS) ×2 IMPLANT
PACK CYSTO (CUSTOM PROCEDURE TRAY) ×2 IMPLANT
SHEATH ACCESS URETERAL 24CM (SHEATH) ×2 IMPLANT
STENT URET 6FRX26 CONTOUR (STENTS) ×4 IMPLANT
SYR CONTROL 10ML LL (SYRINGE) ×2 IMPLANT
TUBE FEEDING 8FR 16IN STR KANG (MISCELLANEOUS) ×2 IMPLANT
TUBING CONNECTING 10 (TUBING) ×2 IMPLANT

## 2016-03-10 NOTE — Anesthesia Preprocedure Evaluation (Addendum)
Anesthesia Evaluation  Patient identified by MRN, date of birth, ID band Patient awake    Reviewed: Allergy & Precautions, NPO status , Patient's Chart, lab work & pertinent test results  Airway Mallampati: II  TM Distance: <3 FB Neck ROM: Full    Dental no notable dental hx. (+) Dental Advisory Given   Pulmonary neg pulmonary ROS,    Pulmonary exam normal breath sounds clear to auscultation       Cardiovascular hypertension, Pt. on medications Normal cardiovascular exam Rhythm:Regular Rate:Normal     Neuro/Psych negative neurological ROS  negative psych ROS   GI/Hepatic negative GI ROS, Neg liver ROS,   Endo/Other  Hypothyroidism Morbid obesity  Renal/GU negative Renal ROS  negative genitourinary   Musculoskeletal negative musculoskeletal ROS (+)   Abdominal (+) + obese,   Peds negative pediatric ROS (+)  Hematology negative hematology ROS (+)   Anesthesia Other Findings   Reproductive/Obstetrics negative OB ROS                             Anesthesia Physical  Anesthesia Plan  ASA: III  Anesthesia Plan: General   Post-op Pain Management:    Induction: Intravenous  Airway Management Planned: Oral ETT  Additional Equipment:   Intra-op Plan:   Post-operative Plan: Extubation in OR  Informed Consent: I have reviewed the patients History and Physical, chart, labs and discussed the procedure including the risks, benefits and alternatives for the proposed anesthesia with the patient or authorized representative who has indicated his/her understanding and acceptance.   Dental advisory given  Plan Discussed with: CRNA and Surgeon  Anesthesia Plan Comments:        Anesthesia Quick Evaluation

## 2016-03-10 NOTE — Discharge Instructions (Signed)
General Anesthesia, Adult, Care After These instructions provide you with information about caring for yourself after your procedure. Your health care provider may also give you more specific instructions. Your treatment has been planned according to current medical practices, but problems sometimes occur. Call your health care provider if you have any problems or questions after your procedure. What can I expect after the procedure? After the procedure, it is common to have:  Vomiting.  A sore throat.  Mental slowness. It is common to feel:  Nauseous.  Cold or shivery.  Sleepy.  Tired.  Sore or achy, even in parts of your body where you did not have surgery. Follow these instructions at home: For at least 24 hours after the procedure:  Do not:  Participate in activities where you could fall or become injured.  Drive.  Use heavy machinery.  Drink alcohol.  Take sleeping pills or medicines that cause drowsiness.  Make important decisions or sign legal documents.  Take care of children on your own.  Rest. Eating and drinking  If you vomit, drink water, juice, or soup when you can drink without vomiting.  Drink enough fluid to keep your urine clear or pale yellow.  Make sure you have little or no nausea before eating solid foods.  Follow the diet recommended by your health care provider. General instructions  Have a responsible adult stay with you until you are awake and alert.  Return to your normal activities as told by your health care provider. Ask your health care provider what activities are safe for you.  Take over-the-counter and prescription medicines only as told by your health care provider.  If you smoke, do not smoke without supervision.  Keep all follow-up visits as told by your health care provider. This is important. Contact a health care provider if:  You continue to have nausea or vomiting at home, and medicines are not helpful.  You  cannot drink fluids or start eating again.  You cannot urinate after 8-12 hours.  You develop a skin rash.  You have fever.  You have increasing redness at the site of your procedure. Get help right away if:  You have difficulty breathing.  You have chest pain.  You have unexpected bleeding.  You feel that you are having a life-threatening or urgent problem. This information is not intended to replace advice given to you by your health care provider. Make sure you discuss any questions you have with your health care provider. Document Released: 06/28/2000 Document Revised: 08/25/2015 Document Reviewed: 03/06/2015 Elsevier Interactive Patient Education  2017 [employer]    1 - You may have urinary urgency (bladder spasms) and bloody urine on / off with stent in place. This is normal.  2 - Remove tethered stents on Monday morning at home by pulling on string, then blue-white plastic tubing, and discarding. There are TWO stents. Office is open Monday if any acute issues arise.   3 - Call MD or go to ER for fever >102, severe pain / nausea / vomiting not relieved by medications, or acute change in medical status

## 2016-03-10 NOTE — Transfer of Care (Signed)
Immediate Anesthesia Transfer of Care Note  Patient: Festus Holtsaula M Gatchell  Procedure(s) Performed: Procedure(s): CYSTOSCOPY/BILATERAL URETEROSCOPY/ BILATERAL HOLMIUM LASER Timothy Lasso/BILATERALSTENT PLACEMENT/ BILATERAL RETOGRADE PYLEOGRAM (Bilateral)  Patient Location: PACU  Anesthesia Type:General  Level of Consciousness:  sedated, patient cooperative and responds to stimulation  Airway & Oxygen Therapy:Patient Spontanous Breathing and Patient connected to face mask oxgen  Post-op Assessment:  Report given to PACU RN and Post -op Vital signs reviewed and stable  Post vital signs:  Reviewed and stable  Last Vitals:  Vitals:   03/10/16 1107  BP: (!) 172/94  Pulse: 82  Resp: 18  Temp: 36.7 C    Complications: No apparent anesthesia complications

## 2016-03-10 NOTE — Anesthesia Postprocedure Evaluation (Signed)
Anesthesia Post Note  Patient: Festus Holtsaula M Begue  Procedure(s) Performed: Procedure(s) (LRB): CYSTOSCOPY/BILATERAL URETEROSCOPY/ BILATERAL HOLMIUM LASER /BILATERALSTENT PLACEMENT/ BILATERAL RETOGRADE PYLEOGRAM (Bilateral)  Patient location during evaluation: PACU Anesthesia Type: General Level of consciousness: sedated Pain management: pain level controlled Vital Signs Assessment: post-procedure vital signs reviewed and stable Respiratory status: spontaneous breathing and respiratory function stable Cardiovascular status: stable Anesthetic complications: no    Last Vitals:  Vitals:   03/10/16 1107 03/10/16 1500  BP: (!) 172/94   Pulse: 82   Resp: 18 (!) (P) 24  Temp: 36.7 C (P) 36.7 C    Last Pain:  Vitals:   03/10/16 1107  TempSrc: Oral                 Kleber Crean DANIEL

## 2016-03-10 NOTE — Brief Op Note (Signed)
03/10/2016  2:46 PM  PATIENT:  Rebecca HoltsPaula M Esch  45 y.o. female  PRE-OPERATIVE DIAGNOSIS:  BILATERAL RENAL STONES  POST-OPERATIVE DIAGNOSIS:  BILATERAL RENAL STONES  PROCEDURE:  Procedure(s): CYSTOSCOPY/BILATERAL URETEROSCOPY/ BILATERAL HOLMIUM LASER /BILATERALSTENT PLACEMENT/ BILATERAL RETOGRADE PYLEOGRAM (Bilateral)  SURGEON:  Surgeon(s) and Role:    * Sebastian Acheheodore Marcelia Petersen, MD - Primary  PHYSICIAN ASSISTANT:   ASSISTANTS: none   ANESTHESIA:   general  EBL:  Total I/O In: 1000 [I.V.:1000] Out: 0   BLOOD ADMINISTERED:none  DRAINS: none   LOCAL MEDICATIONS USED:  NONE  SPECIMEN:  Source of Specimen:  bilateral ureteral and renal stone fragments  DISPOSITION OF SPECIMEN:  [facility] for compositional analysis  COUNTS:  YES  TOURNIQUET:  * No tourniquets in log *  DICTATION: .Other Dictation: Dictation Number [id number]  PLAN OF CARE: Discharge to home after PACU  PATIENT DISPOSITION:  PACU - hemodynamically stable.   Delay start of Pharmacological VTE agent (>24hrs) due to surgical blood loss or risk of bleeding: yes

## 2016-03-10 NOTE — Anesthesia Procedure Notes (Signed)
Procedure Name: Intubation Date/Time: 03/10/2016 1:33 PM Performed by: Jhonnie GarnerMARSHALL, Kimley Apsey M Pre-anesthesia Checklist: Patient identified, Emergency Drugs available, Suction available and Patient being monitored Patient Re-evaluated:Patient Re-evaluated prior to inductionOxygen Delivery Method: Circle system utilized Preoxygenation: Pre-oxygenation with 100% oxygen Intubation Type: IV induction Ventilation: Mask ventilation without difficulty Laryngoscope Size: Mac and 3 Grade View: Grade I Tube type: Oral Tube size: 7.5 mm Number of attempts: 1 Airway Equipment and Method: Stylet Placement Confirmation: ETT inserted through vocal cords under direct vision,  positive ETCO2 and breath sounds checked- equal and bilateral Secured at: 22 cm Tube secured with: Tape Dental Injury: Teeth and Oropharynx as per pre-operative assessment

## 2016-03-10 NOTE — H&P (Signed)
Rebecca [name] is an 45 y.o. female.    Chief Complaint: Pre-op Bilateral Ureteroscopic Stone Manipulation  HPI:   1 - Recurrent Nephrolithiasis -  Pre-2017: ure[place] x 10 [place]. 02/2016 - Rt 6mm distal stone with mod hydro and bilateral scattered 2-653mm non-obstructing stones on ER CT on eval flank pain. Treated with bilateral ureteral stents for renal decompression.   PMH sig for morbid obesity, thyroid surgery, carpel tunnel surgery, tympanoplasty. No CV disease. No strong blood thinners.   Today "Rebecca [name]" is seen to proceed with bilateral ureteroscopic stone manipulation with goal of stone free. NO interval fevers. She had some non-clonal bacteruria last visit and has been on ABX for this.    Past Medical History:  Diagnosis Date  . History of kidney stones   . Hypertension   . Hypothyroidism   . Kidney stones   . Thyroid disease     Past Surgical History:  Procedure Laterality Date  . CARPAL TUNNEL RELEASE Right 07/24/2012   Procedure: CARPAL TUNNEL RELEASE;  Surgeon: Vickki HearingStanley E Harrison, MD;  Location: [facility];  Service: Orthopedics;  Laterality: Right;  Right Carpal Tunnel Release  . CYSTOSCOPY     x6 for stones-with stenting  . CYSTOSCOPY W/ URETERAL STENT PLACEMENT Bilateral 02/10/2016   Procedure: CYSTOSCOPY WITH RETROGRADE PYELOGRAM/BILATERAL URETERAL STENT PLACEMENT;  Surgeon: Sebastian Acheheodore Gable Odonohue, MD;  Location: [facility];  Service: Urology;  Laterality: Bilateral;  . THYROIDECTOMY      No family history on file. Social History:  reports that she has never smoked. She has never used smokeless tobacco. She reports that she does not drink alcohol or use drugs.  Allergies: No Known Allergies  No prescriptions prior to admission.    No results found for this or any previous visit (from the past 48 hour(s)). No results found.  Review of Systems  Constitutional: Negative.  Negative for chills and fever.  HENT: Negative.   Eyes: Negative.   Respiratory:  Negative.   Cardiovascular: Negative.   Gastrointestinal: Negative.   Genitourinary: Positive for frequency and urgency.  Musculoskeletal: Negative.   Skin: Negative.   Neurological: Negative.   Endo/Heme/Allergies: Negative.   Psychiatric/Behavioral: Negative.     There were no vitals taken for this visit. Physical Exam  Constitutional: She is oriented to person, place, and time. She appears well-developed.  HENT:  Head: Normocephalic.  Eyes: Pupils are equal, round, and reactive to light.  Neck: Normal range of motion.  Cardiovascular: Normal rate.   Respiratory: Effort normal.  GI:  Stable morbid truncal obesity  Genitourinary:  Genitourinary Comments: No CVAT at present.   Musculoskeletal: Normal range of motion.  Neurological: She is alert and oriented to person, place, and time.  Skin: Skin is warm.  Psychiatric: She has a normal mood and affect.     Assessment/Plan  Proceed as planned with bilateral ureteroscopic stone manipulation. Risks, benefits, alternatives, expected peri-op course, need for possible staged approach, need for possible peri-op stens reiterated.   Sebastian AcheMANNY, Irelynn Schermerhorn, MD 03/10/2016, 6:23 AM

## 2016-03-11 DIAGNOSIS — N202 Calculus of kidney with calculus of ureter: Secondary | ICD-10-CM | POA: Diagnosis not present

## 2016-03-11 NOTE — Op Note (Deleted)
  The note originally documented on this encounter has been moved the the encounter in which it belongs.  

## 2016-03-11 NOTE — Op Note (Signed)
NAMTenna Child:  Delahoz, Devonna                 ACCOUNT NO.:  [id number]  MEDICAL RECORD NO.:  [id number]  LOCATION:  1402                           FACILITY:  PHYSICIAN:  Sebastian Acheheodore Iliani Vejar, MD     DATE OF BIRTH:  March 29, 1971  DATE OF PROCEDURE: 03/10/2016                               OPERATIVE REPORT   DIAGNOSES:  Bilateral ureteral and renal stones, history of bacteriuria.  PROCEDURES: 1. Cystoscopy, bilateral pyelogram interpretation. 2. Bilateral ureteroscopy with laser lithotripsy. 3. Insertion of bilateral ureteral stents.  ESTIMATED BLOOD LOSS:  Nil.  COMPLICATION:  None.  SPECIMEN:  Bilateral renal and ureteral stone fragments for compositional analysis.  FINDINGS: 1. Unremarkable urinary bladder. 2. Bilateral mid ureteral stones. 3. Bilateral small volume intrarenal stones. 4. Complete resolution of all stone fragments larger than 1/3rd mm     following laser lithotripsy and basket extraction.  INDICATION:  Ms. Melvyn NethLewis is a 45 year old lady with history of morbid obesity, thyroid disease, and recurrent nephrolithiasis, who was found on workup of colicky flank pain, last month to have a right mid ureteral stone, bilateral renal stones as well as bacteriuria. She does have a history of prior urosepsis with stones, as such she was managed with bilateral ureteral stent placement at that time.  Plan for unstaged approach with bilateral ureteroscopic stone manipulation, average clearing per infectious parameters.  We will await what she since has. She now presents for renal stone management with goal of stone free. Informed consent was obtained and placed in the medical record.  PROCEDURE IN DETAIL:  The patient being Tenna Childaula Probert was receiving bilateral ureteroscopic stone manipulation was confirmed.  Procedure was carried out.  Time-out was performed.  Intravenous antibiotics were administered.  General anesthesia was introduced.  The patient placed into a low lithotomy position and  sterile field was created by prepping the patient's vagina, introitus, and proximal thighs using iodine x3. Next, cystourethroscopy was performed.  A rigid cystoscope with offset lens.  Inspection of the urinary bladder revealed distal and ureteral stents in situ in adequate position, it was graspedn and brouth out in its entirity.  Next, the right ureteral orifice was cannulated with a 6-French end-hole catheter and right retrograde pyelogram was obtained.  Right retrograde pyelogram demonstrated a single right ureter with single-system right kidney.  There was a filling defect in the mid ureter consistent with known stone.  A 0.038 Zip wire was advanced to the level of the upper pole set aside as a safety wire.  Similarly, left retrograde pyelogram was obtained.  Left retrograde pyelogram demonstrated a single left ureter with single- system left kidney.  There was a questionable left distal ureteral stone.  No hydroureteronephrosis.  A separate 0.038 Zip wire was advanced to the level of the upper pole, set aside as a safety wire.  An 8-French feeding tube placed in urinary bladder for pressure release. Next, semi-rigid ureteroscopy was performed of the right ureter alongside a separate Sensor working wire.  As expected, the right mid ureteral stone was indeed encountered.  It appeared to be much too large for simple basketing.  Holmium laser energy applied to the stone using 0.3 joules and 30 hertz fragmenting  it into approximately three smaller pieces, which were then removed in entirety set aside for compositional analysis.  Semi-rigid ureteroscopy, the remainder of the right ureter revealed no additional calcifications or mucosal abnormalities.  The semi-rigid scope was exchanged for a 12/14, 24-cm ureteral access sheath at the level of proximal ureter using fluoroscopic guidance and flexible digital ureteroscopy was performed at the proximal ureter and systematic incpection of  the right kidney including all calices x3, there were multiple small volume intrarenal stones including upper, mid, and lower pole components.  The vast majorities appeared to be amenable to simple basketing.  They were each grasped on their long axis, removed, and set aside for compositional analysis.  A single mid pole stone was too large and also holmium laser energy applied to this before removing its fragments.  Following these maneuvers, complete resolution of all stone fragments larger than 1/3rd mm in the right kidney.  The access sheath was removed under continuous vision.  No mucosal abnormalities were found.  Attention was then directed at left-sided ureteroscopy.  Left ureteroscopy was performed using a semi-rigid ureteroscope and in the left distal ureter, indeed there was a new ureteral stone present.  This was not present on her imaging last month.  This also appeared to be too large for simple basketing.  Holmium laser energy applied to the stone using settings of 0.3 joules and 30 hertz fragmenting it into 3 smaller pieces, which were then sequentially removed set aside for compositional analysis.  Semi-rigid ureteroscopy was then performed of the remainder length of left ureter.  No mucosal abnormalities were found and was exchanged for a 12/14 ureteral access sheath over the Sensor working wire using continuous fluoroscopic vision and flexible digital ureteroscopy was performed of the left proximal ureter and left kidney including all calyces x3.  There were multiple stone fragments moved through papillary tip.  These were amenable to simple basketing.  They were all removed and set aside for compositional analysis.  Following these maneuvers, there was excellent hemostasis, complete resolution of all stone fragments larger than 1/3rd mm on the left side.  The access sheath was removed under continuous vision.  No mucosal abnormalities were found.  Given the use of  bilateral procedure and bilateral access sheath, it was felt that interval stenting would be warranted with a tethered stent as such a new bilateral 6-French stents were placed over the remaining Zip safety wire using fluoroscopic guidance, good proximal and distal coil were noted bilaterally.  Tether was left in place and fashioned to the inner thigh and the procedure was terminated.  The patient tolerated the procedure well.  No immediate periprocedural complications.  The patient was taken to the Postanesthesia Care Unit in stable condition.    ______________________________ Sebastian Acheheodore Imojean Yoshino, MD   ______________________________ Sebastian Acheheodore Metzli Pollick, MD    TM/MEDQ  D:  03/10/2016  T:  03/11/2016  Job:  [id number]

## 2016-06-11 DIAGNOSIS — I10 Essential (primary) hypertension: Secondary | ICD-10-CM | POA: Diagnosis not present

## 2016-06-11 DIAGNOSIS — R7989 Other specified abnormal findings of blood chemistry: Secondary | ICD-10-CM | POA: Diagnosis not present

## 2016-06-11 DIAGNOSIS — R945 Abnormal results of liver function studies: Secondary | ICD-10-CM | POA: Diagnosis not present

## 2016-06-11 DIAGNOSIS — D72829 Elevated white blood cell count, unspecified: Secondary | ICD-10-CM | POA: Diagnosis not present

## 2016-06-11 DIAGNOSIS — F900 Attention-deficit hyperactivity disorder, predominantly inattentive type: Secondary | ICD-10-CM | POA: Diagnosis not present

## 2016-06-11 DIAGNOSIS — E039 Hypothyroidism, unspecified: Secondary | ICD-10-CM | POA: Diagnosis not present

## 2016-07-06 DIAGNOSIS — Z1151 Encounter for screening for human papillomavirus (HPV): Secondary | ICD-10-CM | POA: Diagnosis not present

## 2016-07-06 DIAGNOSIS — Z01419 Encounter for gynecological examination (general) (routine) without abnormal findings: Secondary | ICD-10-CM | POA: Diagnosis not present

## 2016-07-06 DIAGNOSIS — Z6841 Body Mass Index (BMI) 40.0 and over, adult: Secondary | ICD-10-CM | POA: Diagnosis not present

## 2016-07-26 DIAGNOSIS — E039 Hypothyroidism, unspecified: Secondary | ICD-10-CM | POA: Diagnosis not present

## 2016-08-13 DIAGNOSIS — H524 Presbyopia: Secondary | ICD-10-CM | POA: Diagnosis not present

## 2016-08-13 DIAGNOSIS — H5213 Myopia, bilateral: Secondary | ICD-10-CM | POA: Diagnosis not present

## 2016-09-13 DIAGNOSIS — E039 Hypothyroidism, unspecified: Secondary | ICD-10-CM | POA: Diagnosis not present

## 2016-09-13 DIAGNOSIS — F900 Attention-deficit hyperactivity disorder, predominantly inattentive type: Secondary | ICD-10-CM | POA: Diagnosis not present

## 2016-09-13 DIAGNOSIS — I10 Essential (primary) hypertension: Secondary | ICD-10-CM | POA: Diagnosis not present

## 2016-09-29 DIAGNOSIS — M793 Panniculitis, unspecified: Secondary | ICD-10-CM | POA: Diagnosis not present

## 2016-09-29 DIAGNOSIS — Z6841 Body Mass Index (BMI) 40.0 and over, adult: Secondary | ICD-10-CM | POA: Diagnosis not present

## 2016-11-10 DIAGNOSIS — Z6841 Body Mass Index (BMI) 40.0 and over, adult: Secondary | ICD-10-CM | POA: Diagnosis not present

## 2016-12-14 DIAGNOSIS — I10 Essential (primary) hypertension: Secondary | ICD-10-CM | POA: Diagnosis not present

## 2016-12-14 DIAGNOSIS — F900 Attention-deficit hyperactivity disorder, predominantly inattentive type: Secondary | ICD-10-CM | POA: Diagnosis not present

## 2016-12-14 DIAGNOSIS — Z6841 Body Mass Index (BMI) 40.0 and over, adult: Secondary | ICD-10-CM | POA: Diagnosis not present

## 2016-12-14 DIAGNOSIS — E039 Hypothyroidism, unspecified: Secondary | ICD-10-CM | POA: Diagnosis not present

## 2016-12-23 ENCOUNTER — Other Ambulatory Visit (HOSPITAL_COMMUNITY): Payer: Self-pay | Admitting: Surgery

## 2017-01-11 ENCOUNTER — Other Ambulatory Visit: Payer: Self-pay

## 2017-01-11 ENCOUNTER — Ambulatory Visit (HOSPITAL_COMMUNITY)
Admission: RE | Admit: 2017-01-11 | Discharge: 2017-01-11 | Disposition: A | Discharge status: Home / Self Care | Payer: BLUE CROSS/BLUE SHIELD | Source: Ambulatory Visit | Attending: Surgery | Admitting: Surgery

## 2017-01-11 DIAGNOSIS — Z01818 Encounter for other preprocedural examination: Secondary | ICD-10-CM | POA: Diagnosis not present

## 2017-01-11 DIAGNOSIS — Z6841 Body Mass Index (BMI) 40.0 and over, adult: Secondary | ICD-10-CM | POA: Diagnosis not present

## 2017-03-17 DIAGNOSIS — Z0000 Encounter for general adult medical examination without abnormal findings: Secondary | ICD-10-CM | POA: Diagnosis not present

## 2017-06-15 DIAGNOSIS — Z6841 Body Mass Index (BMI) 40.0 and over, adult: Secondary | ICD-10-CM | POA: Diagnosis not present

## 2017-06-15 DIAGNOSIS — E039 Hypothyroidism, unspecified: Secondary | ICD-10-CM | POA: Diagnosis not present

## 2017-06-15 DIAGNOSIS — F900 Attention-deficit hyperactivity disorder, predominantly inattentive type: Secondary | ICD-10-CM | POA: Diagnosis not present

## 2017-06-15 DIAGNOSIS — I10 Essential (primary) hypertension: Secondary | ICD-10-CM | POA: Diagnosis not present

## 2017-09-14 DIAGNOSIS — Z6841 Body Mass Index (BMI) 40.0 and over, adult: Secondary | ICD-10-CM | POA: Diagnosis not present

## 2017-09-14 DIAGNOSIS — E039 Hypothyroidism, unspecified: Secondary | ICD-10-CM | POA: Diagnosis not present

## 2017-09-14 DIAGNOSIS — F900 Attention-deficit hyperactivity disorder, predominantly inattentive type: Secondary | ICD-10-CM | POA: Diagnosis not present

## 2017-09-14 DIAGNOSIS — I10 Essential (primary) hypertension: Secondary | ICD-10-CM | POA: Diagnosis not present

## 2017-11-17 DIAGNOSIS — E039 Hypothyroidism, unspecified: Secondary | ICD-10-CM | POA: Diagnosis not present

## 2017-12-15 DIAGNOSIS — Z6841 Body Mass Index (BMI) 40.0 and over, adult: Secondary | ICD-10-CM | POA: Diagnosis not present

## 2017-12-15 DIAGNOSIS — I10 Essential (primary) hypertension: Secondary | ICD-10-CM | POA: Diagnosis not present

## 2017-12-15 DIAGNOSIS — E039 Hypothyroidism, unspecified: Secondary | ICD-10-CM | POA: Diagnosis not present

## 2017-12-15 DIAGNOSIS — F900 Attention-deficit hyperactivity disorder, predominantly inattentive type: Secondary | ICD-10-CM | POA: Diagnosis not present

## 2018-03-20 DIAGNOSIS — Z6841 Body Mass Index (BMI) 40.0 and over, adult: Secondary | ICD-10-CM | POA: Diagnosis not present

## 2018-03-20 DIAGNOSIS — F900 Attention-deficit hyperactivity disorder, predominantly inattentive type: Secondary | ICD-10-CM | POA: Diagnosis not present

## 2018-03-20 DIAGNOSIS — I10 Essential (primary) hypertension: Secondary | ICD-10-CM | POA: Diagnosis not present

## 2018-03-20 DIAGNOSIS — E039 Hypothyroidism, unspecified: Secondary | ICD-10-CM | POA: Diagnosis not present

## 2018-03-22 DIAGNOSIS — I10 Essential (primary) hypertension: Secondary | ICD-10-CM | POA: Diagnosis not present

## 2018-03-23 DIAGNOSIS — E039 Hypothyroidism, unspecified: Secondary | ICD-10-CM | POA: Diagnosis not present

## 2018-03-23 DIAGNOSIS — I10 Essential (primary) hypertension: Secondary | ICD-10-CM | POA: Diagnosis not present

## 2018-04-03 DIAGNOSIS — Z136 Encounter for screening for cardiovascular disorders: Secondary | ICD-10-CM | POA: Diagnosis not present

## 2018-04-03 DIAGNOSIS — I10 Essential (primary) hypertension: Secondary | ICD-10-CM | POA: Diagnosis not present

## 2018-04-03 DIAGNOSIS — Z0000 Encounter for general adult medical examination without abnormal findings: Secondary | ICD-10-CM | POA: Diagnosis not present

## 2018-05-16 DIAGNOSIS — H7291 Unspecified perforation of tympanic membrane, right ear: Secondary | ICD-10-CM | POA: Diagnosis not present

## 2018-05-16 DIAGNOSIS — J0141 Acute recurrent pansinusitis: Secondary | ICD-10-CM | POA: Insufficient documentation

## 2018-05-16 DIAGNOSIS — H6983 Other specified disorders of Eustachian tube, bilateral: Secondary | ICD-10-CM | POA: Diagnosis not present

## 2018-05-16 DIAGNOSIS — H9211 Otorrhea, right ear: Secondary | ICD-10-CM | POA: Diagnosis not present

## 2018-05-22 IMAGING — CT CT RENAL STONE PROTOCOL
2 of 4 series · 16 of 46 positions shown, 18 images · non-contrast
Comparison: 05/26/2007

CLINICAL DATA: Right flank pain

EXAM:
CT ABDOMEN AND PELVIS WITHOUT CONTRAST
TECHNIQUE: Multidetector CT imaging of the abdomen and pelvis was performed
following the standard protocol without IV contrast.

[Series 2: axial st · axial · 0.98mm/px · z∈[-571,-76]mm · 13 of 109 slices shown, 15 images]
[im 5/109  soft-tissue]
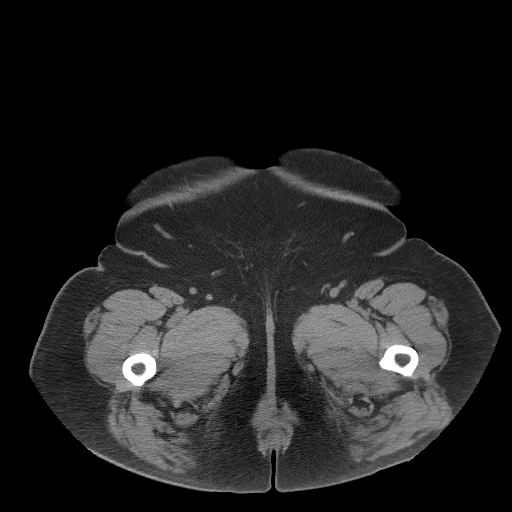
[im 5/109  bone]
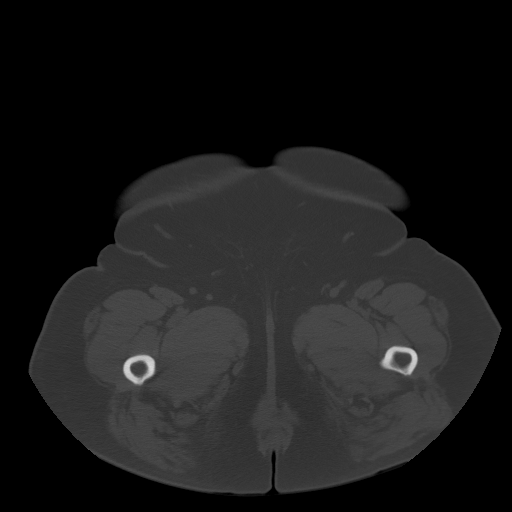
[im 14/109  soft-tissue]
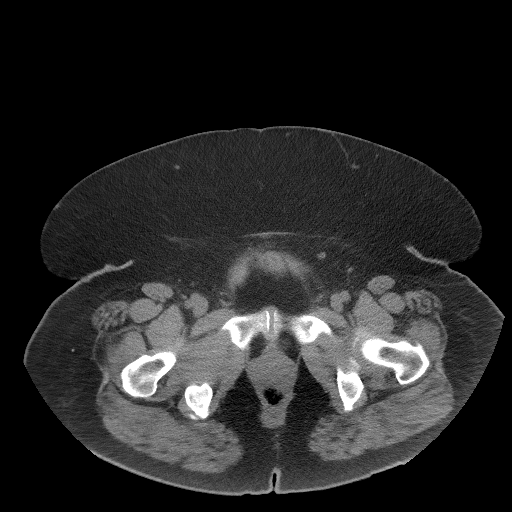
[im 23/109  soft-tissue]
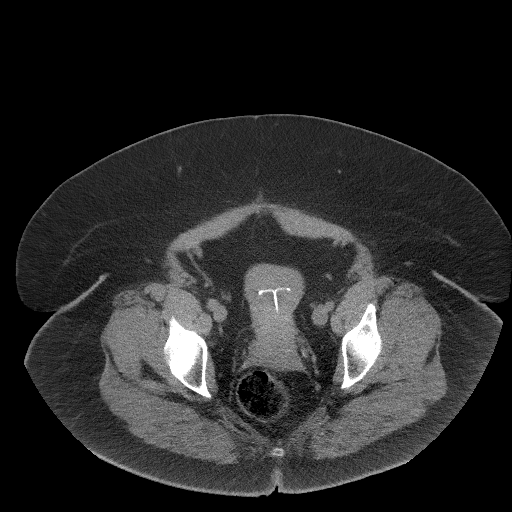
[im 32/109  soft-tissue]
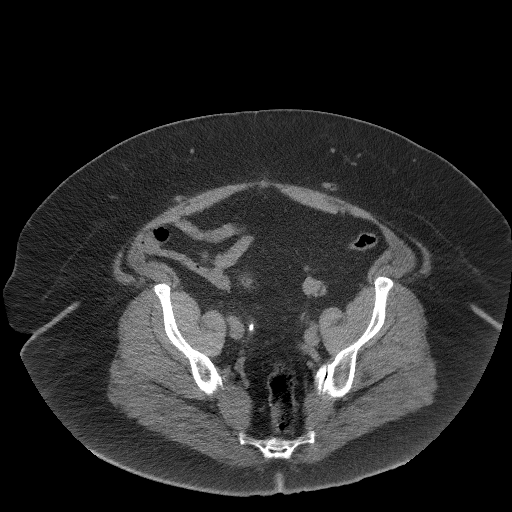
[im 37/109  soft-tissue]
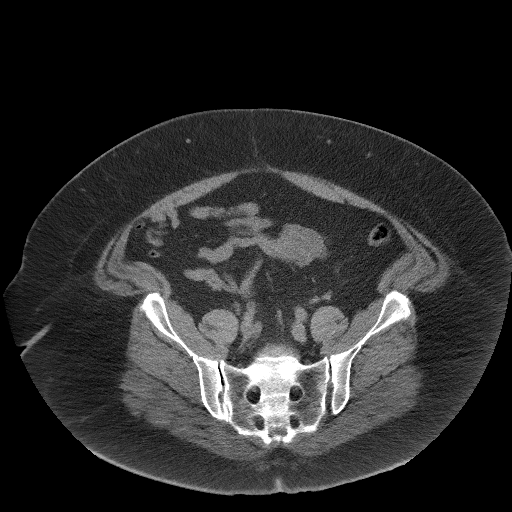
[im 46/109  soft-tissue]
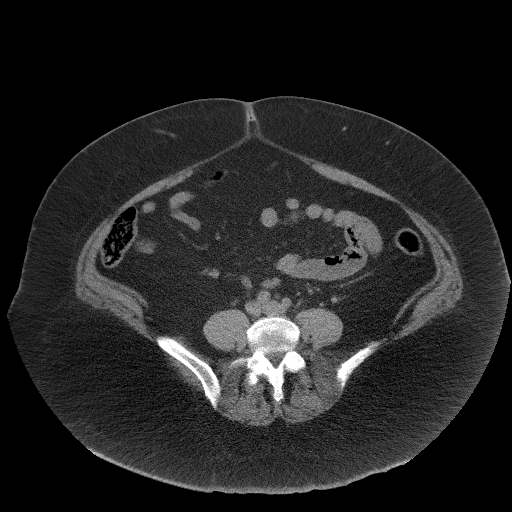
[im 55/109  soft-tissue]
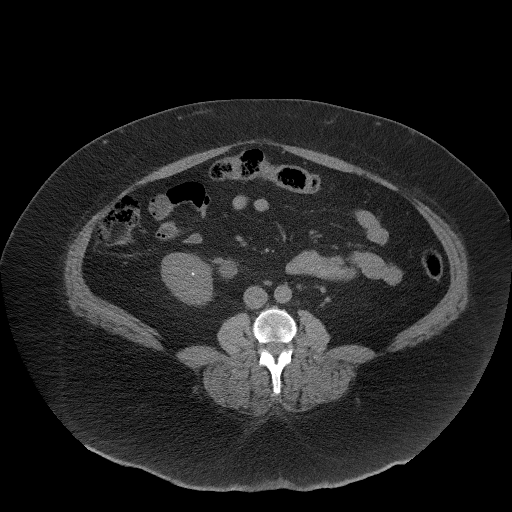
[im 64/109  soft-tissue]
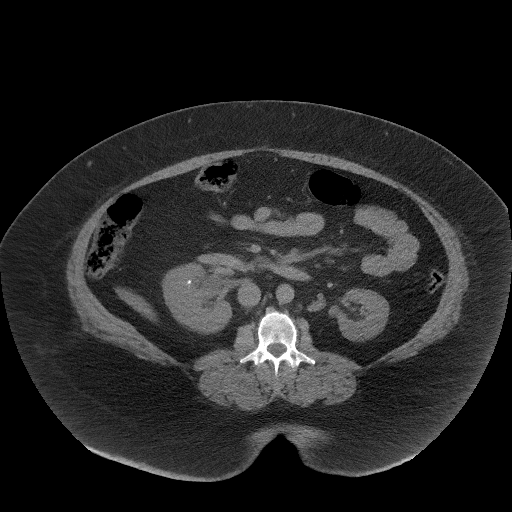
[im 73/109  soft-tissue]
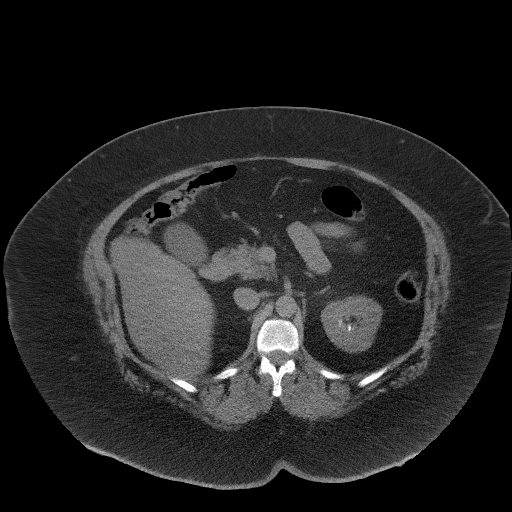
[im 73/109  bone]
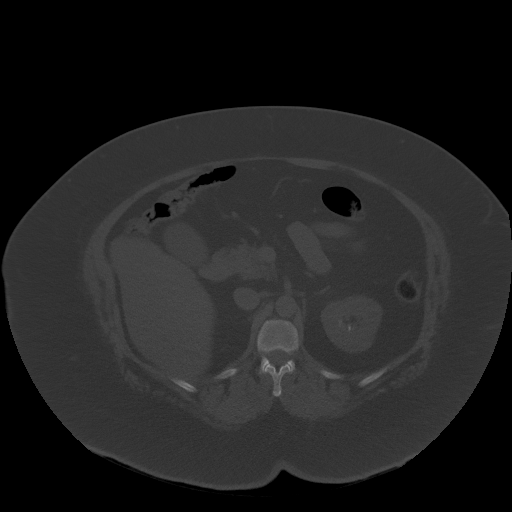
[im 77/109  soft-tissue]
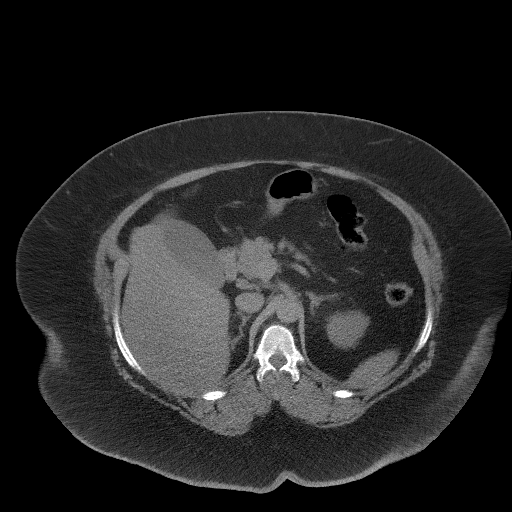
[im 86/109  soft-tissue]
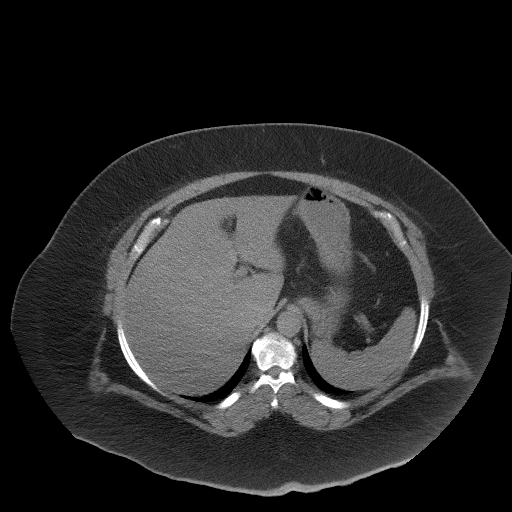
[im 95/109  soft-tissue]
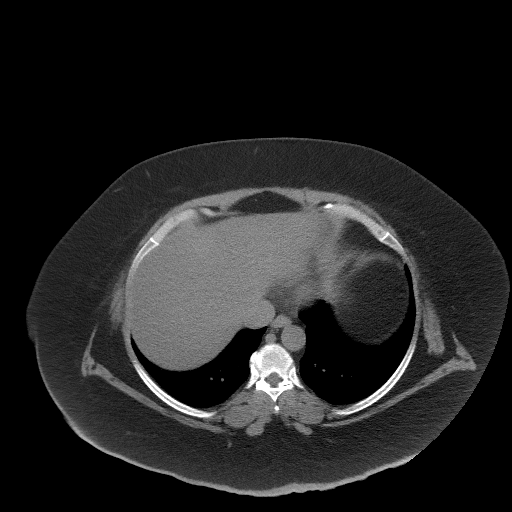
[im 104/109  soft-tissue]
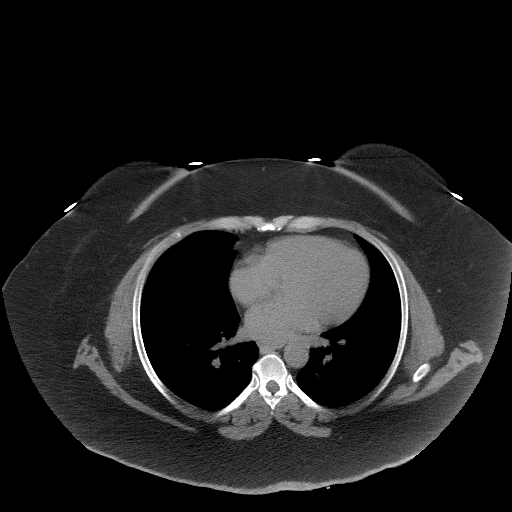

[Series 4: coronal st · coronal · 1.10mm/px · 3 of 135 slices shown]
[im 45/135  soft-tissue]
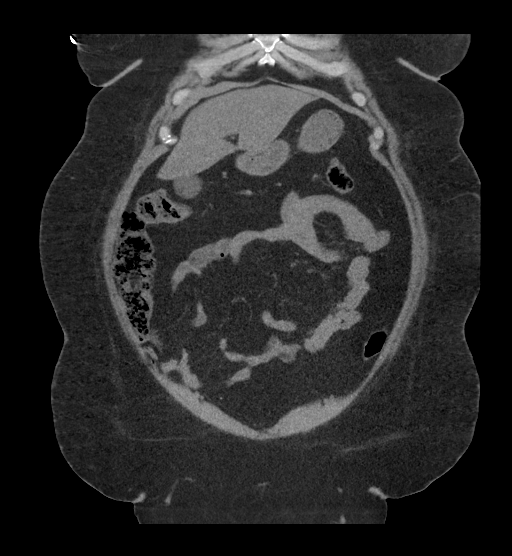
[im 60/135  soft-tissue]
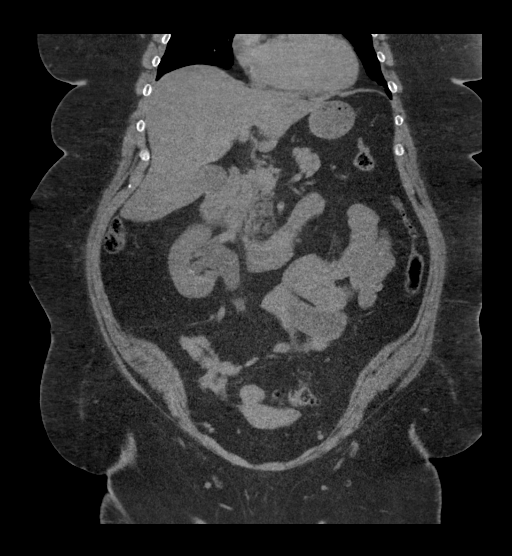
[im 75/135  soft-tissue]
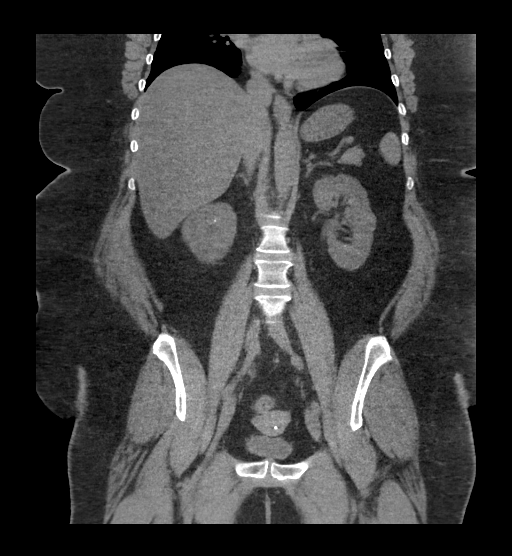

[16 of 46 positions shown; findings below may reference images not displayed]

FINDINGS: Lower chest: The lung bases are unremarkable.

Hepatobiliary: Unenhanced liver shows no biliary ductal dilatation.
No calcified gallstones are noted within gallbladder.

Pancreas: Unenhanced pancreas is unremarkable.

Spleen: Unenhanced spleen is unremarkable.

Adrenals/Urinary Tract: No adrenal gland mass. There is bilateral
nonobstructive nephrolithiasis. Largest calcified nonobstructive
calculus midpole of the right kidney measures 6 mm. Largest
nonobstructive calculus lower pole of the left kidney measures 5 mm.
There is mild right hydronephrosis and right hydroureter. Axial
image 77 there is calcified obstructive calculus in distal right
ureter measures 6 mm at the level of inferior aspect of the right SI
joint. The urinary bladder is empty limiting its assessment.

Stomach/Bowel: There is no small bowel obstruction. Normal appendix.
No pericecal inflammation. Normal appendix. No colitis or
diverticulitis. Moderate stool noted within rectum.

Vascular/Lymphatic: No aortic aneurysm. No retroperitoneal or
mesenteric adenopathy.

Reproductive: The uterus is anteflexed. IUD noted within uterus. No
adnexal mass.

Other: No ascites or free abdominal air.  No inguinal adenopathy.

Musculoskeletal: No destructive bony lesions are noted. Sagittal
images of the spine shows mild degenerative changes lower thoracic
spine.
IMPRESSION: 1. There is bilateral nonobstructive nephrolithiasis.
2. Mild right hydronephrosis and right hydroureter. There is 6 mm
calcified obstructive calculus in distal right ureter at the level
of inferior aspect of right SI joint. Mild right periureteral
stranding.
3. No pericecal inflammation.  Normal appendix.
4. No colitis or diverticulitis.
5. No small bowel obstruction.
6. IUD within anteflexed uterus.

## 2018-05-23 DIAGNOSIS — R1900 Intra-abdominal and pelvic swelling, mass and lump, unspecified site: Secondary | ICD-10-CM | POA: Diagnosis not present

## 2018-05-23 DIAGNOSIS — Z113 Encounter for screening for infections with a predominantly sexual mode of transmission: Secondary | ICD-10-CM | POA: Diagnosis not present

## 2018-05-23 DIAGNOSIS — Z01411 Encounter for gynecological examination (general) (routine) with abnormal findings: Secondary | ICD-10-CM | POA: Diagnosis not present

## 2018-05-23 DIAGNOSIS — T8332XA Displacement of intrauterine contraceptive device, initial encounter: Secondary | ICD-10-CM | POA: Diagnosis not present

## 2018-05-25 ENCOUNTER — Other Ambulatory Visit: Payer: Self-pay | Admitting: Obstetrics & Gynecology

## 2018-05-25 DIAGNOSIS — R1900 Intra-abdominal and pelvic swelling, mass and lump, unspecified site: Secondary | ICD-10-CM

## 2018-05-29 ENCOUNTER — Other Ambulatory Visit: Payer: BLUE CROSS/BLUE SHIELD

## 2018-06-01 ENCOUNTER — Other Ambulatory Visit: Payer: BLUE CROSS/BLUE SHIELD

## 2018-06-19 DIAGNOSIS — F900 Attention-deficit hyperactivity disorder, predominantly inattentive type: Secondary | ICD-10-CM | POA: Diagnosis not present

## 2018-06-19 DIAGNOSIS — R7309 Other abnormal glucose: Secondary | ICD-10-CM | POA: Diagnosis not present

## 2018-06-19 DIAGNOSIS — Z6841 Body Mass Index (BMI) 40.0 and over, adult: Secondary | ICD-10-CM | POA: Diagnosis not present

## 2018-06-19 DIAGNOSIS — E039 Hypothyroidism, unspecified: Secondary | ICD-10-CM | POA: Diagnosis not present

## 2018-06-19 DIAGNOSIS — H9211 Otorrhea, right ear: Secondary | ICD-10-CM | POA: Diagnosis not present

## 2018-06-19 DIAGNOSIS — H7291 Unspecified perforation of tympanic membrane, right ear: Secondary | ICD-10-CM | POA: Diagnosis not present

## 2018-06-19 DIAGNOSIS — I10 Essential (primary) hypertension: Secondary | ICD-10-CM | POA: Diagnosis not present

## 2018-07-04 DIAGNOSIS — T8332XA Displacement of intrauterine contraceptive device, initial encounter: Secondary | ICD-10-CM | POA: Diagnosis not present

## 2018-08-10 DIAGNOSIS — E039 Hypothyroidism, unspecified: Secondary | ICD-10-CM | POA: Diagnosis not present

## 2018-08-10 DIAGNOSIS — R002 Palpitations: Secondary | ICD-10-CM | POA: Diagnosis not present

## 2018-09-13 DIAGNOSIS — Z30430 Encounter for insertion of intrauterine contraceptive device: Secondary | ICD-10-CM | POA: Diagnosis not present

## 2018-09-14 DIAGNOSIS — T8332XD Displacement of intrauterine contraceptive device, subsequent encounter: Secondary | ICD-10-CM | POA: Diagnosis not present

## 2018-09-14 DIAGNOSIS — E039 Hypothyroidism, unspecified: Secondary | ICD-10-CM | POA: Diagnosis not present

## 2018-09-14 DIAGNOSIS — Z113 Encounter for screening for infections with a predominantly sexual mode of transmission: Secondary | ICD-10-CM | POA: Diagnosis not present

## 2018-09-14 DIAGNOSIS — Z01818 Encounter for other preprocedural examination: Secondary | ICD-10-CM | POA: Diagnosis not present

## 2018-09-14 DIAGNOSIS — N898 Other specified noninflammatory disorders of vagina: Secondary | ICD-10-CM | POA: Diagnosis not present

## 2018-09-15 DIAGNOSIS — E039 Hypothyroidism, unspecified: Secondary | ICD-10-CM | POA: Diagnosis not present

## 2018-09-15 DIAGNOSIS — F900 Attention-deficit hyperactivity disorder, predominantly inattentive type: Secondary | ICD-10-CM | POA: Diagnosis not present

## 2018-09-18 NOTE — Pre-Procedure Instructions (Signed)
Rebecca [name]  09/18/2018      [id number] [place]- [place], [place] - [address] AT [facility] [address] [facility] [address] [place] [id number] Phone: [phone] Fax: [phone]    Your procedure is scheduled on Fri., September 22, 2018 from 7:30AM-8:30AM  [facility]" at 5:30AM   Call this number if you have problems the morning of surgery:  [phone]   Remember:  Do not eat or drink after midnight on June 18th   Take these medicines the morning of surgery with A SIP OF WATER: Levothyroxine (SYNTHROID, LEVOTHROID)  As of today, stop taking all Other Aspirin Products, Vitamins, Fish oils, and Herbal medications. Also stop all NSAIDS i.e. Advil, Ibuprofen, Motrin, Aleve, Anaprox, Naproxen, BC, Goody Powders, and all Supplements.    Special instructions: [place]- Preparing For Surgery  Before surgery, you can play an important role. Because skin is not sterile, your skin needs to be as free of germs as possible. You can reduce the number of germs on your skin by washing with CHG (chlorahexidine gluconate) Soap before surgery.  CHG is an antiseptic cleaner which kills germs and bonds with the skin to continue killing germs even after washing.    Please do not use if you have an allergy to CHG or antibacterial soaps. If your skin becomes reddened/irritated stop using the CHG.  Do not shave (including legs and underarms) for at least 48 hours prior to first CHG shower. It is OK to shave your face.  Please follow these instructions carefully.   1. Shower the NIGHT BEFORE SURGERY and the MORNING OF SURGERY with CHG.   2. If you chose to wash your hair, wash your hair first as usual with your normal shampoo.  3. After you shampoo, rinse your hair and body thoroughly to remove the shampoo.  4. Use CHG as you would any other liquid soap. You can apply CHG directly to the skin and wash gently with a scrungie or a  clean washcloth.   5. Apply the CHG Soap to your body ONLY FROM THE NECK DOWN.  Do not use on open wounds or open sores. Avoid contact with your eyes, ears, mouth and genitals (private parts). Wash Face and genitals (private parts)  with your normal soap.  6. Wash thoroughly, paying special attention to the area where your surgery will be performed.  7. Thoroughly rinse your body with warm water from the neck down.  8. DO NOT shower/wash with your normal soap after using and rinsing off the CHG Soap.  9. Pat yourself dry with a CLEAN TOWEL.  10. Wear CLEAN PAJAMAS to bed the night before surgery, wear comfortable clothes the morning of surgery  11. Place CLEAN SHEETS on your bed the night of your first shower and DO NOT SLEEP WITH PETS.   Day of Surgery:  Oral Hygiene is also important to reduce your risk of infection.  Remember - BRUSH YOUR TEETH THE MORNING OF SURGERY WITH YOUR REGULAR TOOTHPASTE   Do not wear jewelry, make-up or nail polish.  Do not wear lotions, powders, or perfumes, or deodorant.  Do not shave 48 hours prior to surgery.    Do not bring valuables to the hospital.  [facility] is not responsible for any belongings or valuables.  Contacts, dentures or bridgework may not be worn into surgery.    For patients admitted to the hospital, discharge time will  be determined by your treatment team.  Patients discharged the day of surgery will not be allowed to drive home.   Please wear clean clothes to the hospital/surgery center.     Please read over the following fact sheets that you were given. Pain Booklet, Coughing and Deep Breathing and Surgical Site Infection Prevention

## 2018-09-19 ENCOUNTER — Encounter (HOSPITAL_COMMUNITY)
Admission: RE | Admit: 2018-09-19 | Discharge: 2018-09-19 | Disposition: A | Discharge status: Home / Self Care | Payer: BC Managed Care – PPO | Source: Ambulatory Visit | Attending: Obstetrics & Gynecology | Admitting: Obstetrics & Gynecology

## 2018-09-19 ENCOUNTER — Encounter (HOSPITAL_COMMUNITY): Payer: Self-pay

## 2018-09-19 ENCOUNTER — Other Ambulatory Visit (HOSPITAL_COMMUNITY)
Admission: RE | Admit: 2018-09-19 | Discharge: 2018-09-19 | Disposition: A | Discharge status: Home / Self Care | Payer: BC Managed Care – PPO | Source: Ambulatory Visit | Attending: Obstetrics & Gynecology | Admitting: Obstetrics & Gynecology

## 2018-09-19 ENCOUNTER — Other Ambulatory Visit: Payer: Self-pay

## 2018-09-19 DIAGNOSIS — Z1159 Encounter for screening for other viral diseases: Secondary | ICD-10-CM | POA: Diagnosis not present

## 2018-09-19 DIAGNOSIS — Z01812 Encounter for preprocedural laboratory examination: Secondary | ICD-10-CM | POA: Diagnosis not present

## 2018-09-19 LAB — BASIC METABOLIC PANEL
Anion gap: 10 (ref 5–15)
BUN: 13 mg/dL (ref 6–20)
CO2: 23 mmol/L (ref 22–32)
Calcium: 9.3 mg/dL (ref 8.9–10.3)
Chloride: 108 mmol/L (ref 98–111)
Creatinine, Ser: 1.03 mg/dL — ABNORMAL HIGH (ref 0.44–1.00)
GFR calc Af Amer: >60 mL/min (ref >60)
GFR calc non Af Amer: >60 mL/min (ref >60)
Glucose, Bld: 129 mg/dL — ABNORMAL HIGH (ref 70–99)
Potassium: 4.3 mmol/L (ref 3.5–5.1)
Sodium: 141 mmol/L (ref 135–145)

## 2018-09-19 LAB — CBC
HCT: 47.1 % — ABNORMAL HIGH (ref 36.0–46.0)
Hemoglobin: 15.2 g/dL — ABNORMAL HIGH (ref 12.0–15.0)
MCH: 30.2 pg (ref 26.0–34.0)
MCHC: 32.3 g/dL (ref 30.0–36.0)
MCV: 93.6 fL (ref 80.0–100.0)
Platelets: 400 10*3/uL (ref 150–400)
RBC: 5.03 MIL/uL (ref 3.87–5.11)
RDW: 12.9 % (ref 11.5–15.5)
WBC: 8.9 10*3/uL (ref 4.0–10.5)
nRBC: 0 % (ref 0.0–0.2)

## 2018-09-19 NOTE — Progress Notes (Signed)
PCP - Dr. Orland Mustard, A.  Cardiologist - Denies  Chest x-ray - Denies  EKG - Req'd  Stress Test - Denies  ECHO - Denies  Cardiac Cath - Denies  AICD-na PM-na LOOP-na  Sleep Study - Denies CPAP - None  LABS- 09/19/2018: CBC, BMP 09/22/2018: POC UPreg   ASA-Denies  ERAS- No   Anesthesia- Yes- Req'd EKG  Pt denies having chest pain, sob, or fever at this time. All instructions explained to the pt, with a verbal understanding of the material. Pt agrees to go over the instructions while at home for a better understanding. The opportunity to ask questions was provided.   Coronavirus Screening  Have you experienced the following symptoms:  Cough yes/no: No Fever (>100.43F)  yes/no: No Runny nose yes/no: No Sore throat yes/no: No Difficulty breathing/shortness of breath  yes/no: No  Have you or a family member traveled in the last 14 days and where? yes/no: No   If the patient indicates "YES" to the above questions, their PAT will be rescheduled to limit the exposure to others and, the surgeon will be notified. THE PATIENT WILL NEED TO BE ASYMPTOMATIC FOR 14 DAYS.   If the patient is not experiencing any of these symptoms, the PAT nurse will instruct them to NOT bring anyone with them to their appointment since they may have these symptoms or traveled as well.   Please remind your patients and families that hospital visitation restrictions are in effect and the importance of the restrictions.

## 2018-09-20 LAB — NOVEL CORONAVIRUS, NAA (HOSP ORDER, SEND-OUT TO REF LAB; TAT 18-24 HRS): SARS-CoV-2, NAA: NOT DETECTED

## 2018-09-20 NOTE — Progress Notes (Signed)
Anesthesia Chart Review:  Case: [id number] Date/Time: 09/22/18 0715   Procedure: HYSTEROSCOPY W/ IUD REMOVAL AND REPLACEMENT (N/A )   Anesthesia type: Choice   Pre-op diagnosis: T83.32XA IUD strings lost   Location: [facility] OR ROOM 07 / [facility]   Surgeon: Janyth Pupa, DO      DISCUSSION: Patient is a 48 year old female scheduled for the above procedure.  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  BMI is consistent with morbid obesity.  She needs a urine pregnancy test on the day of surgery. Presurgical COVID test on 09/19/18 was negative.   VS: BP (!) 136/91   Pulse 92   Temp (!) 36.4 C   Resp 20   Ht 5\' 5"  (1.651 m)   Wt (!) 150.5 kg   SpO2 95%   BMI 55.23 kg/m    PROVIDERS: London Pepper, MD is PCP Jerrell Belfast, MD is ENT   LABS: Labs reviewed: Acceptable for surgery. TSH 3.520 on 09/14/18 [facility]). (all labs ordered are listed, but only abnormal results are displayed)  Labs Reviewed  BASIC METABOLIC PANEL - Abnormal; Notable for the following components:      Result Value   Glucose, Bld 129 (*)    Creatinine, Ser 1.03 (*)    All other components within normal limits  CBC - Abnormal; Notable for the following components:   Hemoglobin 15.2 (*)    HCT 47.1 (*)    All other components within normal limits  NOVEL CORONAVIRUS, NAA (HOSPITAL ORDER, SEND-OUT TO REF LAB)    EKG: 08/10/18 [facility]): NSR   CV: N/A  Past Medical History:  Diagnosis Date  . History of kidney stones   . Hypertension   . Hypothyroidism   . Thyroid disease     Past Surgical History:  Procedure Laterality Date  . CARPAL TUNNEL RELEASE Right 07/24/2012   Procedure: CARPAL TUNNEL RELEASE;  Surgeon: Carole Civil, MD;  Location: [facility] ORS;  Service: Orthopedics;  Laterality: Right;  Right Carpal Tunnel Release  . CYSTOSCOPY     x6 for stones-with stenting  . CYSTOSCOPY W/  URETERAL STENT PLACEMENT Bilateral 02/10/2016   Procedure: CYSTOSCOPY WITH RETROGRADE PYELOGRAM/BILATERAL URETERAL STENT PLACEMENT;  Surgeon: Alexis Frock, MD;  Location: [facility] ORS;  Service: Urology;  Laterality: Bilateral;  . CYSTOSCOPY/URETEROSCOPY/HOLMIUM LASER/STENT PLACEMENT Bilateral 03/10/2016   Procedure: CYSTOSCOPY/BILATERAL URETEROSCOPY/ BILATERAL HOLMIUM LASER /BILATERALSTENT PLACEMENT/ BILATERAL RETOGRADE PYLEOGRAM;  Surgeon: Alexis Frock, MD;  Location: [facility] ORS;  Service: Urology;  Laterality: Bilateral;  . DILATION AND CURETTAGE OF UTERUS    . THYROIDECTOMY      MEDICATIONS: . amphetamine-dextroamphetamine (ADDERALL) 20 MG tablet  . aspirin-acetaminophen-caffeine (EXCEDRIN MIGRAINE) 250-250-65 MG tablet  . hydrochlorothiazide (MICROZIDE) 12.5 MG capsule  . levonorgestrel (MIRENA) 20 MCG/24HR IUD  . levothyroxine (SYNTHROID, LEVOTHROID) 175 MCG tablet  . lisinopril (ZESTRIL) 10 MG tablet  . metroNIDAZOLE (FLAGYL) 500 MG tablet  . oxyCODONE-acetaminophen (ROXICET) 5-325 MG tablet  . senna-docusate (SENOKOT-S) 8.6-50 MG tablet  . sulfamethoxazole-trimethoprim (BACTRIM DS,SEPTRA DS) 800-160 MG tablet   No current facility-administered medications for this encounter.   - She is not currently taking Roxicet, Senokot, Bactrim. She should be finished with Flagyl on 09/21/18.  Myra Gianotti, PA-C Surgical Short Stay/Anesthesiology [facility] Phone [phone] [facility] Phone [phone] 09/20/2018 10:31 AM

## 2018-09-20 NOTE — Anesthesia Preprocedure Evaluation (Addendum)
Anesthesia Evaluation  Patient identified by MRN, date of birth, ID band Patient awake    Reviewed: Allergy & Precautions, NPO status , Patient's Chart, lab work & pertinent test results  Airway Mallampati: II       Dental  (+) Teeth Intact   Pulmonary    Pulmonary exam normal        Cardiovascular hypertension, Pt. on medications Normal cardiovascular exam Rhythm:Regular Rate:Normal     Neuro/Psych    GI/Hepatic negative GI ROS, Neg liver ROS,   Endo/Other  Hypothyroidism Morbid obesity  Renal/GU negative Renal ROS     Musculoskeletal negative musculoskeletal ROS (+)   Abdominal (+) + obese,   Peds  Hematology   Anesthesia Other Findings   Reproductive/Obstetrics                           Anesthesia Physical Anesthesia Plan  ASA: III  Anesthesia Plan: General   Post-op Pain Management:    Induction: Intravenous  PONV Risk Score and Plan: Ondansetron, Dexamethasone, Midazolam and Scopolamine patch - Pre-op  Airway Management Planned: LMA and Oral ETT  Additional Equipment:   Intra-op Plan:   Post-operative Plan: Extubation in OR  Informed Consent: I have reviewed the patients History and Physical, chart, labs and discussed the procedure including the risks, benefits and alternatives for the proposed anesthesia with the patient or authorized representative who has indicated his/her understanding and acceptance.     Dental advisory given  Plan Discussed with: CRNA  Anesthesia Plan Comments: (PAT note written 09/20/2018 by Myra Gianotti, PA-C. )     Anesthesia Quick Evaluation

## 2018-09-22 ENCOUNTER — Ambulatory Visit (HOSPITAL_COMMUNITY): Payer: BC Managed Care – PPO | Admitting: Vascular Surgery

## 2018-09-22 ENCOUNTER — Encounter (HOSPITAL_COMMUNITY)
Admission: RE | Disposition: A | Discharge status: Home / Self Care | Payer: Self-pay | Source: Home / Self Care | Attending: Obstetrics & Gynecology

## 2018-09-22 ENCOUNTER — Ambulatory Visit (HOSPITAL_COMMUNITY)
Admission: RE | Admit: 2018-09-22 | Discharge: 2018-09-22 | Disposition: A | Discharge status: Home / Self Care | Payer: BC Managed Care – PPO | Attending: Obstetrics & Gynecology | Admitting: Obstetrics & Gynecology

## 2018-09-22 ENCOUNTER — Other Ambulatory Visit: Payer: Self-pay

## 2018-09-22 ENCOUNTER — Ambulatory Visit (HOSPITAL_COMMUNITY): Payer: BC Managed Care – PPO | Admitting: Anesthesiology

## 2018-09-22 ENCOUNTER — Encounter (HOSPITAL_COMMUNITY): Payer: Self-pay | Admitting: *Deleted

## 2018-09-22 DIAGNOSIS — X58XXXA Exposure to other specified factors, initial encounter: Secondary | ICD-10-CM | POA: Insufficient documentation

## 2018-09-22 DIAGNOSIS — Z7982 Long term (current) use of aspirin: Secondary | ICD-10-CM | POA: Insufficient documentation

## 2018-09-22 DIAGNOSIS — Z30432 Encounter for removal of intrauterine contraceptive device: Secondary | ICD-10-CM | POA: Diagnosis not present

## 2018-09-22 DIAGNOSIS — E890 Postprocedural hypothyroidism: Secondary | ICD-10-CM | POA: Diagnosis not present

## 2018-09-22 DIAGNOSIS — Z793 Long term (current) use of hormonal contraceptives: Secondary | ICD-10-CM | POA: Insufficient documentation

## 2018-09-22 DIAGNOSIS — T8389XD Other specified complication of genitourinary prosthetic devices, implants and grafts, subsequent encounter: Secondary | ICD-10-CM | POA: Diagnosis not present

## 2018-09-22 DIAGNOSIS — Z87442 Personal history of urinary calculi: Secondary | ICD-10-CM | POA: Diagnosis not present

## 2018-09-22 DIAGNOSIS — I10 Essential (primary) hypertension: Secondary | ICD-10-CM | POA: Insufficient documentation

## 2018-09-22 DIAGNOSIS — Z79899 Other long term (current) drug therapy: Secondary | ICD-10-CM | POA: Insufficient documentation

## 2018-09-22 DIAGNOSIS — Z6841 Body Mass Index (BMI) 40.0 and over, adult: Secondary | ICD-10-CM | POA: Diagnosis not present

## 2018-09-22 DIAGNOSIS — T8332XD Displacement of intrauterine contraceptive device, subsequent encounter: Secondary | ICD-10-CM

## 2018-09-22 DIAGNOSIS — E039 Hypothyroidism, unspecified: Secondary | ICD-10-CM | POA: Diagnosis not present

## 2018-09-22 DIAGNOSIS — T8332XA Displacement of intrauterine contraceptive device, initial encounter: Secondary | ICD-10-CM | POA: Insufficient documentation

## 2018-09-22 DIAGNOSIS — Z30430 Encounter for insertion of intrauterine contraceptive device: Secondary | ICD-10-CM

## 2018-09-22 DIAGNOSIS — N854 Malposition of uterus: Secondary | ICD-10-CM | POA: Insufficient documentation

## 2018-09-22 DIAGNOSIS — S93409A Sprain of unspecified ligament of unspecified ankle, initial encounter: Secondary | ICD-10-CM | POA: Diagnosis not present

## 2018-09-22 HISTORY — PX: IUD REMOVAL: SHX5392

## 2018-09-22 HISTORY — PX: HYSTEROSCOPY: SHX211

## 2018-09-22 HISTORY — PX: INTRAUTERINE DEVICE (IUD) INSERTION: SHX5877

## 2018-09-22 LAB — POCT PREGNANCY, URINE: Preg Test, Ur: NEGATIVE

## 2018-09-22 SURGERY — HYSTEROSCOPY
Anesthesia: General | Site: Vagina

## 2018-09-22 MED ORDER — SUCCINYLCHOLINE CHLORIDE 200 MG/10ML IV SOSY
PREFILLED_SYRINGE | INTRAVENOUS | Status: AC
  Filled 2018-09-22: qty 10

## 2018-09-22 MED ORDER — PHENYLEPHRINE 40 MCG/ML (10ML) SYRINGE FOR IV PUSH (FOR BLOOD PRESSURE SUPPORT)
PREFILLED_SYRINGE | INTRAVENOUS | Status: AC
  Filled 2018-09-22: qty 10

## 2018-09-22 MED ORDER — PHENYLEPHRINE 40 MCG/ML (10ML) SYRINGE FOR IV PUSH (FOR BLOOD PRESSURE SUPPORT)
PREFILLED_SYRINGE | INTRAVENOUS | Status: DC | PRN
  Administered 2018-09-22 (×3): 120 ug via INTRAVENOUS
  Administered 2018-09-22: 80 ug via INTRAVENOUS
  Administered 2018-09-22: 120 ug via INTRAVENOUS

## 2018-09-22 MED ORDER — LIDOCAINE 2% (20 MG/ML) 5 ML SYRINGE
INTRAMUSCULAR | Status: DC | PRN
  Administered 2018-09-22: 100 mg via INTRAVENOUS

## 2018-09-22 MED ORDER — PHENYLEPHRINE HCL-NACL 10-0.9 MG/250ML-% IV SOLN
0.0000 ug/min | INTRAVENOUS | Status: DC
  Administered 2018-09-22: 65 ug/min via INTRAVENOUS

## 2018-09-22 MED ORDER — MEPERIDINE HCL 25 MG/ML IJ SOLN: 6.2500 mg | INTRAMUSCULAR | Status: DC | PRN

## 2018-09-22 MED ORDER — EPHEDRINE SULFATE-NACL 50-0.9 MG/10ML-% IV SOSY
PREFILLED_SYRINGE | INTRAVENOUS | Status: DC | PRN
  Administered 2018-09-22 (×3): 10 mg via INTRAVENOUS

## 2018-09-22 MED ORDER — ONDANSETRON HCL 4 MG/2ML IJ SOLN
INTRAMUSCULAR | Status: DC | PRN
  Administered 2018-09-22: 4 mg via INTRAVENOUS

## 2018-09-22 MED ORDER — PROPOFOL 10 MG/ML IV BOLUS
INTRAVENOUS | Status: DC | PRN
  Administered 2018-09-22: 300 mg via INTRAVENOUS
  Administered 2018-09-22: 50 mg via INTRAVENOUS

## 2018-09-22 MED ORDER — MIDAZOLAM HCL 2 MG/2ML IJ SOLN
INTRAMUSCULAR | Status: AC
  Filled 2018-09-22: qty 2

## 2018-09-22 MED ORDER — HYDROMORPHONE HCL 1 MG/ML IJ SOLN: 0.2500 mg | INTRAMUSCULAR | Status: DC | PRN

## 2018-09-22 MED ORDER — ONDANSETRON HCL 4 MG/2ML IJ SOLN
INTRAMUSCULAR | Status: AC
  Filled 2018-09-22: qty 2

## 2018-09-22 MED ORDER — SODIUM CHLORIDE 0.9 % IR SOLN
Status: DC | PRN
  Administered 2018-09-22: 3000 mL

## 2018-09-22 MED ORDER — SUCCINYLCHOLINE CHLORIDE 20 MG/ML IJ SOLN
INTRAMUSCULAR | Status: DC | PRN
  Administered 2018-09-22: 200 mg via INTRAVENOUS

## 2018-09-22 MED ORDER — EPHEDRINE 5 MG/ML INJ
INTRAVENOUS | Status: AC
  Filled 2018-09-22: qty 10

## 2018-09-22 MED ORDER — PROPOFOL 10 MG/ML IV BOLUS
INTRAVENOUS | Status: AC
  Filled 2018-09-22: qty 40

## 2018-09-22 MED ORDER — ONDANSETRON HCL 4 MG/2ML IJ SOLN: 4.0000 mg | Freq: Once | INTRAMUSCULAR | Status: DC | PRN

## 2018-09-22 MED ORDER — ACETAMINOPHEN 10 MG/ML IV SOLN
1000.0000 mg | Freq: Once | INTRAVENOUS | Status: AC
  Administered 2018-09-22: 1000 mg via INTRAVENOUS

## 2018-09-22 MED ORDER — LIDOCAINE 2% (20 MG/ML) 5 ML SYRINGE
INTRAMUSCULAR | Status: AC
  Filled 2018-09-22: qty 5

## 2018-09-22 MED ORDER — DEXAMETHASONE SODIUM PHOSPHATE 10 MG/ML IJ SOLN
INTRAMUSCULAR | Status: AC
  Filled 2018-09-22: qty 1

## 2018-09-22 MED ORDER — SODIUM CHLORIDE 0.9 % IV SOLN
INTRAVENOUS | Status: DC | PRN
  Administered 2018-09-22: 50 ug/min via INTRAVENOUS

## 2018-09-22 MED ORDER — MIDAZOLAM HCL 5 MG/5ML IJ SOLN
INTRAMUSCULAR | Status: DC | PRN
  Administered 2018-09-22: 2 mg via INTRAVENOUS

## 2018-09-22 MED ORDER — DEXAMETHASONE SODIUM PHOSPHATE 10 MG/ML IJ SOLN
INTRAMUSCULAR | Status: DC | PRN
  Administered 2018-09-22: 5 mg via INTRAVENOUS

## 2018-09-22 MED ORDER — FENTANYL CITRATE (PF) 250 MCG/5ML IJ SOLN
INTRAMUSCULAR | Status: AC
  Filled 2018-09-22: qty 5

## 2018-09-22 MED ORDER — LACTATED RINGERS IV SOLN
INTRAVENOUS | Status: DC | PRN
  Administered 2018-09-22 (×2): via INTRAVENOUS

## 2018-09-22 MED ORDER — FENTANYL CITRATE (PF) 100 MCG/2ML IJ SOLN
INTRAMUSCULAR | Status: DC | PRN
  Administered 2018-09-22: 100 ug via INTRAVENOUS

## 2018-09-22 MED ORDER — ACETAMINOPHEN 10 MG/ML IV SOLN
INTRAVENOUS | Status: AC
  Filled 2018-09-22: qty 100

## 2018-09-22 SURGICAL SUPPLY — 17 items
CATH ROBINSON RED A/P 16FR (CATHETERS) ×2 IMPLANT
DEVICE MYOSURE LITE (MISCELLANEOUS) IMPLANT
DEVICE MYOSURE REACH (MISCELLANEOUS) IMPLANT
DILATOR CANAL MILEX (MISCELLANEOUS) ×2 IMPLANT
GLOVE BIOGEL PI IND STRL 7.0 (GLOVE) ×2 IMPLANT
GLOVE BIOGEL PI INDICATOR 7.0 (GLOVE) ×2
GLOVE ECLIPSE 6.5 STRL STRAW (GLOVE) ×2 IMPLANT
GOWN STRL REUS W/ TWL LRG LVL3 (GOWN DISPOSABLE) ×2 IMPLANT
GOWN STRL REUS W/TWL LRG LVL3 (GOWN DISPOSABLE) ×2
HOVERMATT SINGLE USE (MISCELLANEOUS) ×2 IMPLANT
KIT PROCEDURE FLUENT (KITS) ×2 IMPLANT
KIT TURNOVER KIT B (KITS) ×2 IMPLANT
Mirena ×2 IMPLANT
PACK VAGINAL MINOR WOMEN LF (CUSTOM PROCEDURE TRAY) ×2 IMPLANT
PAD OB MATERNITY 4.3X12.25 (PERSONAL CARE ITEMS) ×2 IMPLANT
SEAL ROD LENS SCOPE MYOSURE (ABLATOR) ×2 IMPLANT
TOWEL GREEN STERILE FF (TOWEL DISPOSABLE) ×4 IMPLANT

## 2018-09-22 NOTE — Anesthesia Postprocedure Evaluation (Signed)
Anesthesia Post Note  Patient: Rebecca [name]  Procedure(s) Performed: HYSTEROSCOPY W/ IUD REMOVAL AND REPLACEMENT (N/A Vagina ) Intrauterine Device (Iud) Insertion (N/A Vagina ) Intrauterine Device (Iud) Removal (N/A Vagina )     Patient location during evaluation: PACU Anesthesia Type: General Level of consciousness: awake Pain management: pain level controlled Vital Signs Assessment: post-procedure vital signs reviewed and stable Respiratory status: spontaneous breathing Cardiovascular status: stable Anesthetic complications: no    Last Vitals:  Vitals:   09/22/18 0930 09/22/18 0935  BP: 95/61 99/61  Pulse: 91 84  Resp: 18 19  Temp:    SpO2: 96% 95%    Last Pain:  Vitals:   09/22/18 0935  TempSrc:   PainSc: 0-No pain   Pain Goal:                   Huston Foley

## 2018-09-22 NOTE — Anesthesia Procedure Notes (Signed)
Procedure Name: Intubation Date/Time: 09/22/2018 7:42 AM Performed by: Trinna Post., CRNA Pre-anesthesia Checklist: Patient identified, Emergency Drugs available, Suction available, Patient being monitored and Timeout performed Patient Re-evaluated:Patient Re-evaluated prior to induction Oxygen Delivery Method: Circle system utilized Preoxygenation: Pre-oxygenation with 100% oxygen Induction Type: IV induction and Rapid sequence Laryngoscope Size: Glidescope and 3 Grade View: Grade I Tube type: Oral Tube size: 7.0 mm Number of attempts: 1 Airway Equipment and Method: Rigid stylet and Video-laryngoscopy Placement Confirmation: ETT inserted through vocal cords under direct vision,  positive ETCO2 and breath sounds checked- equal and bilateral Secured at: 23 cm Tube secured with: Tape Dental Injury: Teeth and Oropharynx as per pre-operative assessment

## 2018-09-22 NOTE — Interval H&P Note (Signed)
History and Physical Interval Note:  09/22/2018 6:57 AM  Rebecca [name]  has presented today for surgery, with the diagnosis of T83.32XA IUD strings lost.  The various methods of treatment have been discussed with the patient and family. After consideration of risks, benefits and other options for treatment, the patient has consented to  Procedure(s): HYSTEROSCOPY W/ IUD REMOVAL AND REPLACEMENT (N/A) as a surgical intervention.  The patient's history has been reviewed, patient examined, no change in status, stable for surgery.  I have reviewed the patient's chart and labs.  Questions were answered to the patient's satisfaction.     [name]

## 2018-09-22 NOTE — H&P (Signed)
Rebecca [name] is an 48 y.o. female, presenting for hysteroscopy, removal IUD and IUD replacement due to lost IUD strings. Device was placed in 2013 and she wishes to continue with this device.  Patient Active Problem List   Diagnosis Date Noted  . Right ureteral stone 02/10/2016  . S/P carpal tunnel release 08/07/2012  . CTS (carpal tunnel syndrome) 06/27/2012  . ANKLE SPRAIN 02/24/2010  . PAIN IN JOINT, ANKLE AND FOOT 01/14/2010   Past Medical History:  Diagnosis Date  . History of kidney stones   . Hypertension   . Hypothyroidism   . Thyroid disease     Past Surgical History:  Procedure Laterality Date  . CARPAL TUNNEL RELEASE Right 07/24/2012   Procedure: CARPAL TUNNEL RELEASE;  Surgeon: Vickki HearingStanley E Harrison, MD;  Location: [facility];  Service: Orthopedics;  Laterality: Right;  Right Carpal Tunnel Release  . CYSTOSCOPY     x6 for stones-with stenting  . CYSTOSCOPY W/ URETERAL STENT PLACEMENT Bilateral 02/10/2016   Procedure: CYSTOSCOPY WITH RETROGRADE PYELOGRAM/BILATERAL URETERAL STENT PLACEMENT;  Surgeon: Sebastian Acheheodore [name], MD;  Location: [facility];  Service: Urology;  Laterality: Bilateral;  . CYSTOSCOPY/URETEROSCOPY/HOLMIUM LASER/STENT PLACEMENT Bilateral 03/10/2016   Procedure: CYSTOSCOPY/BILATERAL URETEROSCOPY/ BILATERAL HOLMIUM LASER /BILATERALSTENT PLACEMENT/ BILATERAL RETOGRADE PYLEOGRAM;  Surgeon: Sebastian Acheheodore [name], MD;  Location: [facility];  Service: Urology;  Laterality: Bilateral;  . DILATION AND CURETTAGE OF UTERUS    . THYROIDECTOMY      History reviewed. No pertinent family history.  Social History:  reports that she has never smoked. She has never used smokeless tobacco. She reports current alcohol use. She reports that she does not use drugs.  ALLERGIES/MEDS:  Allergies: No Known Allergies  Medications Prior to Admission  Medication Sig Dispense Refill Last Dose  . amphetamine-dextroamphetamine (ADDERALL) 20 MG tablet Take 10 mg by mouth 2 (two) times daily with a meal.     Past Week at Unknown time  . aspirin-acetaminophen-caffeine (EXCEDRIN MIGRAINE) 250-250-65 MG tablet Take 2 tablets by mouth every 6 (six) hours as needed for headache.   Past Week at Unknown time  . hydrochlorothiazide (MICROZIDE) 12.5 MG capsule Take 12.5 mg by mouth daily.   09/21/2018 at Unknown time  . levonorgestrel (MIRENA) 20 MCG/24HR IUD 1 each by Intrauterine route once.     Marland Kitchen. levothyroxine (SYNTHROID, LEVOTHROID) 175 MCG tablet Take 175 mcg by mouth daily before breakfast.   09/21/2018 at Unknown time  . lisinopril (ZESTRIL) 10 MG tablet Take 10 mg by mouth daily.    09/21/2018 at Unknown time  . metroNIDAZOLE (FLAGYL) 500 MG tablet Take 500 mg by mouth 2 (two) times daily.   09/21/2018 at Unknown time  . oxyCODONE-acetaminophen (ROXICET) 5-325 MG tablet Take 1-2 tablets by mouth every 6 (six) hours as needed for severe pain. Post-operatively (Patient not taking: Reported on 09/18/2018) 30 tablet 0 Not Taking at Unknown time  . senna-docusate (SENOKOT-S) 8.6-50 MG tablet Take 1 tablet by mouth 2 (two) times daily. While taking strong pain meds to prevent constipation. (Patient not taking: Reported on 02/24/2016) 30 tablet 0   . sulfamethoxazole-trimethoprim (BACTRIM DS,SEPTRA DS) 800-160 MG tablet Take 1 tablet by mouth 2 (two) times daily. X 5 days to prevent infection with stents in place. (Patient not taking: Reported on 09/18/2018) 10 tablet 0 Not Taking at Unknown time     Review of Systems  All other systems reviewed and are negative.     Blood pressure 134/87, pulse 100, temperature 97.6 F (36.4 C), temperature source Oral,  resp. rate 20, height 5\' 5"  (1.651 m), weight (!) 149.7 kg, SpO2 97 %. Physical Exam  Gen: NAD CV: RRR Lungs:CTAB Abd: obese, soft and non-tender GU: no ext lesions, pink moist mucosa, strings not visualized at cervical os Ext: no calf tenderness bilaterally  Results for orders placed or performed during the hospital encounter of 09/22/18 (from the past 24  hour(s))  Pregnancy, urine POC     Status: None   Collection Time: 09/22/18  6:18 AM  Result Value Ref Range   Preg Test, Ur NEGATIVE NEGATIVE    ASSESSMENT: 48yo G0 who presents for hysteroscopy, Mirena replacement due to lost IUD strings  PLAN: NPO LR @ 125cc/hr SCDs to or Risk/benefit reviewed with patient including but not limited to risk of bleeding, infection, uterine perforation, inability to replace device.   Janyth Pupa, DO [phone] (cell) [phone] (office)

## 2018-09-22 NOTE — Op Note (Signed)
Operative Report  PreOp: 1) Lost IUD strings 2) Desires Mirena replacement PostOp: same Procedure:  Hysteroscopy, IUD removal, Mirena insertion Surgeon: Dr. Janyth Pupa Anesthesia: General Complications:none EBL: Minimal UOP: 150cc IVF:800cc Discrepancy: 160cc  Findings:7cm anteverted uterus, both ostia visualized, strings of IUD visualized within cervical canal  Specimens: none  Procedure: The patient was taken to the operating room where she underwent general anesthesia without difficulty. The patient was placed in a low lithotomy position using Allen stirrups. She was prepped and draped in the normal sterile fashion. The bladder was drained using a red rubber urethral catheter. A sterile speculum was inserted into the vagina. A single tooth tenaculum was placed on the anterior lip of the cervix. The uterus was then sounded to 7cm using an os finder. The endocervical canal was then serially dilated to 14 [place] using Hank dilators.  The diagnostic hysteroscope was then inserted without difficulty, strings were visualized grasped and the device was brought down within the cavity.  They hysteroscope was removed, the strings were visualized at the cervical os.  Strings were grasped and the device was removed in its entirety.  The hysteroscope was inserted, proliferative endometrium with no abnormalities were noted.  Hysteroscope was removed.  The uterus was again sounded to 7cm.  The Mirena was inserted without difficulty.  Strings were trimmed.  All instrument were then removed. Hemostasis was observed at the cervical site.  The patient was repositioned to the supine position. The patient tolerated the procedure without any complications and taken to recovery in stable condition.   Janyth Pupa, DO [phone] (pager) [phone] (office)

## 2018-09-22 NOTE — Discharge Instructions (Signed)
HOME INSTRUCTIONS  Please note any unusual or excessive bleeding, pain, swelling. Mild dizziness or drowsiness are normal for about 24 hours after surgery.   Shower when comfortable  Restrictions: No driving for 24 hours or while taking pain medications.  Activity:  Vaginal spotting is expected but if your bleeding is heavy, period like,  please call the office   Diet:  You may return to your regular diet.  Do not eat large meals.  Eat small frequent meals throughout the day.  Continue to drink a good amount of water at least 6-8 glasses of water per day, hydration is very important for the healing process.  Pain Management: Take Motrin and/or Tylenol as needed for pain   Alcohol -- Avoid for 24 hours and while taking pain medications.  Nausea: Take sips of ginger ale or soda  Fever -- Call physician if temperature over 101 degrees  Follow up:  If you do not already have a follow up appointment scheduled, please call the office at [phone].  If you experience fever (a temperature greater than 100.4), pain unrelieved by pain medication, shortness of breath, swelling of a single leg, or any other symptoms which are concerning to you please the office immediately.

## 2018-09-22 NOTE — Transfer of Care (Signed)
Immediate Anesthesia Transfer of Care Note  Patient: Rebecca [name]  Procedure(s) Performed: HYSTEROSCOPY W/ IUD REMOVAL AND REPLACEMENT (N/A Vagina ) Intrauterine Device (Iud) Insertion (N/A Vagina ) Intrauterine Device (Iud) Removal (N/A Vagina )  Patient Location: PACU  Anesthesia Type:General  Level of Consciousness: awake, alert  and oriented  Airway & Oxygen Therapy: Patient Spontanous Breathing and Patient connected to face mask oxygen  Post-op Assessment: Report given to RN and Post -op Vital signs reviewed and stable  Post vital signs: Reviewed and unstable, low BP, Dr Jillyn Hidden made aware. Phenylephrine bolus given and gtt started at 64mcg/min.  Last Vitals:  Vitals Value Taken Time  BP 85/33 09/22/18 0843  Temp    Pulse 87 09/22/18 0843  Resp 26 09/22/18 0843  SpO2 99 % 09/22/18 0843  Vitals shown include unvalidated device data.  Last Pain:  Vitals:   09/22/18 0835  TempSrc:   PainSc: (P) 0-No pain         Complications: No apparent anesthesia complications

## 2018-09-25 ENCOUNTER — Encounter (HOSPITAL_COMMUNITY): Payer: Self-pay | Admitting: Obstetrics & Gynecology

## 2018-11-03 DIAGNOSIS — Z30431 Encounter for routine checking of intrauterine contraceptive device: Secondary | ICD-10-CM | POA: Diagnosis not present

## 2018-11-03 DIAGNOSIS — K644 Residual hemorrhoidal skin tags: Secondary | ICD-10-CM | POA: Diagnosis not present

## 2018-11-18 ENCOUNTER — Emergency Department (HOSPITAL_COMMUNITY): Payer: BC Managed Care – PPO

## 2018-11-18 ENCOUNTER — Emergency Department (HOSPITAL_COMMUNITY): Payer: BC Managed Care – PPO | Admitting: Anesthesiology

## 2018-11-18 ENCOUNTER — Encounter (HOSPITAL_COMMUNITY)
Admission: EM | Disposition: A | Discharge status: Home / Self Care | Payer: Self-pay | Source: Home / Self Care | Attending: Urology

## 2018-11-18 ENCOUNTER — Encounter (HOSPITAL_COMMUNITY): Payer: Self-pay | Admitting: Anesthesiology

## 2018-11-18 ENCOUNTER — Other Ambulatory Visit: Payer: Self-pay

## 2018-11-18 ENCOUNTER — Inpatient Hospital Stay (HOSPITAL_COMMUNITY)
Admission: EM | Admit: 2018-11-18 | Discharge: 2018-11-20 | DRG: 660 | Disposition: A | Discharge status: Home / Self Care | Payer: BC Managed Care – PPO | Attending: Urology | Admitting: Urology

## 2018-11-18 DIAGNOSIS — R300 Dysuria: Secondary | ICD-10-CM | POA: Diagnosis not present

## 2018-11-18 DIAGNOSIS — I10 Essential (primary) hypertension: Secondary | ICD-10-CM | POA: Diagnosis not present

## 2018-11-18 DIAGNOSIS — Z79899 Other long term (current) drug therapy: Secondary | ICD-10-CM | POA: Diagnosis not present

## 2018-11-18 DIAGNOSIS — Z793 Long term (current) use of hormonal contraceptives: Secondary | ICD-10-CM

## 2018-11-18 DIAGNOSIS — N136 Pyonephrosis: Secondary | ICD-10-CM | POA: Diagnosis not present

## 2018-11-18 DIAGNOSIS — Z20828 Contact with and (suspected) exposure to other viral communicable diseases: Secondary | ICD-10-CM | POA: Diagnosis not present

## 2018-11-18 DIAGNOSIS — Z87442 Personal history of urinary calculi: Secondary | ICD-10-CM | POA: Diagnosis not present

## 2018-11-18 DIAGNOSIS — R509 Fever, unspecified: Secondary | ICD-10-CM | POA: Diagnosis not present

## 2018-11-18 DIAGNOSIS — Z975 Presence of (intrauterine) contraceptive device: Secondary | ICD-10-CM

## 2018-11-18 DIAGNOSIS — N12 Tubulo-interstitial nephritis, not specified as acute or chronic: Secondary | ICD-10-CM

## 2018-11-18 DIAGNOSIS — Z6841 Body Mass Index (BMI) 40.0 and over, adult: Secondary | ICD-10-CM | POA: Diagnosis not present

## 2018-11-18 DIAGNOSIS — N132 Hydronephrosis with renal and ureteral calculous obstruction: Secondary | ICD-10-CM | POA: Diagnosis not present

## 2018-11-18 DIAGNOSIS — E039 Hypothyroidism, unspecified: Secondary | ICD-10-CM | POA: Diagnosis present

## 2018-11-18 DIAGNOSIS — Z03818 Encounter for observation for suspected exposure to other biological agents ruled out: Secondary | ICD-10-CM | POA: Diagnosis not present

## 2018-11-18 DIAGNOSIS — Z79890 Hormone replacement therapy (postmenopausal): Secondary | ICD-10-CM | POA: Diagnosis not present

## 2018-11-18 DIAGNOSIS — N179 Acute kidney failure, unspecified: Secondary | ICD-10-CM | POA: Diagnosis not present

## 2018-11-18 DIAGNOSIS — R1030 Lower abdominal pain, unspecified: Secondary | ICD-10-CM | POA: Diagnosis not present

## 2018-11-18 DIAGNOSIS — R1084 Generalized abdominal pain: Secondary | ICD-10-CM | POA: Diagnosis not present

## 2018-11-18 HISTORY — PX: CYSTOSCOPY W/ URETERAL STENT PLACEMENT: SHX1429

## 2018-11-18 HISTORY — DX: Hydronephrosis with renal and ureteral calculous obstruction: N13.2

## 2018-11-18 LAB — CBC WITH DIFFERENTIAL/PLATELET
Abs Immature Granulocytes: 0.03 10*3/uL (ref 0.00–0.07)
Basophils Absolute: 0.1 10*3/uL (ref 0.0–0.1)
Basophils Relative: 0 %
Eosinophils Absolute: 0 10*3/uL (ref 0.0–0.5)
Eosinophils Relative: 0 %
HCT: 48.7 % — ABNORMAL HIGH (ref 36.0–46.0)
Hemoglobin: 15.7 g/dL — ABNORMAL HIGH (ref 12.0–15.0)
Immature Granulocytes: 0 %
Lymphocytes Relative: 8 %
Lymphs Abs: 1.1 10*3/uL (ref 0.7–4.0)
MCH: 30.8 pg (ref 26.0–34.0)
MCHC: 32.2 g/dL (ref 30.0–36.0)
MCV: 95.5 fL (ref 80.0–100.0)
Monocytes Absolute: 0.3 10*3/uL (ref 0.1–1.0)
Monocytes Relative: 2 %
Neutro Abs: 12 10*3/uL — ABNORMAL HIGH (ref 1.7–7.7)
Neutrophils Relative %: 90 %
Platelets: 301 10*3/uL (ref 150–400)
RBC: 5.1 MIL/uL (ref 3.87–5.11)
RDW: 13.7 % (ref 11.5–15.5)
WBC: 13.5 10*3/uL — ABNORMAL HIGH (ref 4.0–10.5)
nRBC: 0 % (ref 0.0–0.2)

## 2018-11-18 LAB — URINALYSIS, ROUTINE W REFLEX MICROSCOPIC
Bilirubin Urine: NEGATIVE
Glucose, UA: NEGATIVE mg/dL
Ketones, ur: NEGATIVE mg/dL
Nitrite: POSITIVE — AB
Protein, ur: 30 mg/dL — AB
Specific Gravity, Urine: 1.02 (ref 1.005–1.030)
WBC, UA: >50 WBC/hpf — ABNORMAL HIGH (ref 0–5)
pH: 5 (ref 5.0–8.0)

## 2018-11-18 LAB — BASIC METABOLIC PANEL
Anion gap: 10 (ref 5–15)
BUN: 19 mg/dL (ref 6–20)
CO2: 24 mmol/L (ref 22–32)
Calcium: 9.3 mg/dL (ref 8.9–10.3)
Chloride: 107 mmol/L (ref 98–111)
Creatinine, Ser: 1.64 mg/dL — ABNORMAL HIGH (ref 0.44–1.00)
GFR calc Af Amer: 43 mL/min — ABNORMAL LOW (ref >60)
GFR calc non Af Amer: 37 mL/min — ABNORMAL LOW (ref >60)
Glucose, Bld: 157 mg/dL — ABNORMAL HIGH (ref 70–99)
Potassium: 4.1 mmol/L (ref 3.5–5.1)
Sodium: 141 mmol/L (ref 135–145)

## 2018-11-18 LAB — I-STAT BETA HCG BLOOD, ED (MC, WL, AP ONLY): I-stat hCG, quantitative: <5 m[IU]/mL (ref <5)

## 2018-11-18 LAB — SARS CORONAVIRUS 2 BY RT PCR (HOSPITAL ORDER, PERFORMED IN ~~LOC~~ HOSPITAL LAB): SARS Coronavirus 2: NEGATIVE

## 2018-11-18 SURGERY — CYSTOSCOPY, WITH RETROGRADE PYELOGRAM AND URETERAL STENT INSERTION
Anesthesia: General | Site: Ureter | Laterality: Bilateral

## 2018-11-18 MED ORDER — PROPOFOL 10 MG/ML IV BOLUS
INTRAVENOUS | Status: DC | PRN
  Administered 2018-11-18: 190 mg via INTRAVENOUS

## 2018-11-18 MED ORDER — ONDANSETRON HCL 4 MG/2ML IJ SOLN
INTRAMUSCULAR | Status: DC | PRN
  Administered 2018-11-18: 4 mg via INTRAVENOUS

## 2018-11-18 MED ORDER — LACTATED RINGERS IV SOLN: INTRAVENOUS | Status: DC

## 2018-11-18 MED ORDER — FENTANYL CITRATE (PF) 100 MCG/2ML IJ SOLN
INTRAMUSCULAR | Status: AC
  Filled 2018-11-18: qty 2

## 2018-11-18 MED ORDER — DEXTROSE 5 % IV SOLN
INTRAVENOUS | Status: DC | PRN
  Administered 2018-11-18: 1 g via INTRAVENOUS

## 2018-11-18 MED ORDER — SODIUM CHLORIDE 0.9 % IV SOLN
2.0000 g | INTRAVENOUS | Status: DC
  Administered 2018-11-19 – 2018-11-20 (×2): 2 g via INTRAVENOUS
  Filled 2018-11-18 (×2): qty 2

## 2018-11-18 MED ORDER — KETOROLAC TROMETHAMINE 30 MG/ML IJ SOLN
15.0000 mg | Freq: Once | INTRAMUSCULAR | Status: AC
  Administered 2018-11-18: 15 mg via INTRAVENOUS
  Filled 2018-11-18: qty 1

## 2018-11-18 MED ORDER — METOCLOPRAMIDE HCL 5 MG/ML IJ SOLN: 10.0000 mg | Freq: Once | INTRAMUSCULAR | Status: DC | PRN

## 2018-11-18 MED ORDER — SODIUM CHLORIDE 0.9 % IV SOLN
INTRAVENOUS | Status: AC
  Filled 2018-11-18: qty 10

## 2018-11-18 MED ORDER — SODIUM CHLORIDE 0.9 % IV BOLUS (SEPSIS)
1000.0000 mL | Freq: Once | INTRAVENOUS | Status: AC
  Administered 2018-11-18: 1000 mL via INTRAVENOUS

## 2018-11-18 MED ORDER — OXYCODONE HCL 5 MG PO TABS
5.0000 mg | ORAL_TABLET | ORAL | Status: DC | PRN
  Administered 2018-11-18 – 2018-11-20 (×10): 5 mg via ORAL
  Filled 2018-11-18 (×12): qty 1

## 2018-11-18 MED ORDER — LIDOCAINE 2% (20 MG/ML) 5 ML SYRINGE
INTRAMUSCULAR | Status: DC | PRN
  Administered 2018-11-18: 80 mg via INTRAVENOUS

## 2018-11-18 MED ORDER — DEXAMETHASONE SODIUM PHOSPHATE 10 MG/ML IJ SOLN
INTRAMUSCULAR | Status: AC
  Filled 2018-11-18: qty 2

## 2018-11-18 MED ORDER — SODIUM CHLORIDE 0.9 % IR SOLN
Status: DC | PRN
  Administered 2018-11-18: 3000 mL

## 2018-11-18 MED ORDER — SODIUM CHLORIDE 0.9 % IV SOLN
1.0000 g | Freq: Once | INTRAVENOUS | Status: AC
  Administered 2018-11-18: 1 g via INTRAVENOUS
  Filled 2018-11-18: qty 10

## 2018-11-18 MED ORDER — PROPOFOL 10 MG/ML IV BOLUS
INTRAVENOUS | Status: AC
  Filled 2018-11-18: qty 40

## 2018-11-18 MED ORDER — SODIUM CHLORIDE 0.9 % IV SOLN
INTRAVENOUS | Status: DC
  Administered 2018-11-18 – 2018-11-19 (×3): via INTRAVENOUS

## 2018-11-18 MED ORDER — PHENYLEPHRINE 40 MCG/ML (10ML) SYRINGE FOR IV PUSH (FOR BLOOD PRESSURE SUPPORT)
PREFILLED_SYRINGE | INTRAVENOUS | Status: AC
  Filled 2018-11-18: qty 10

## 2018-11-18 MED ORDER — LIDOCAINE 2% (20 MG/ML) 5 ML SYRINGE
INTRAMUSCULAR | Status: AC
  Filled 2018-11-18: qty 5

## 2018-11-18 MED ORDER — FENTANYL CITRATE (PF) 100 MCG/2ML IJ SOLN
100.0000 ug | Freq: Once | INTRAMUSCULAR | Status: AC
  Administered 2018-11-18: 100 ug via INTRAVENOUS
  Filled 2018-11-18: qty 2

## 2018-11-18 MED ORDER — ROCURONIUM BROMIDE 10 MG/ML (PF) SYRINGE
PREFILLED_SYRINGE | INTRAVENOUS | Status: AC
  Filled 2018-11-18: qty 10

## 2018-11-18 MED ORDER — FENTANYL CITRATE (PF) 100 MCG/2ML IJ SOLN
INTRAMUSCULAR | Status: DC | PRN
  Administered 2018-11-18: 100 ug via INTRAVENOUS

## 2018-11-18 MED ORDER — SUCCINYLCHOLINE CHLORIDE 200 MG/10ML IV SOSY
PREFILLED_SYRINGE | INTRAVENOUS | Status: DC | PRN
  Administered 2018-11-18: 120 mg via INTRAVENOUS

## 2018-11-18 MED ORDER — FENTANYL CITRATE (PF) 100 MCG/2ML IJ SOLN: 25.0000 ug | INTRAMUSCULAR | Status: DC | PRN

## 2018-11-18 MED ORDER — ONDANSETRON HCL 4 MG/2ML IJ SOLN
4.0000 mg | Freq: Once | INTRAMUSCULAR | Status: AC
  Administered 2018-11-18: 4 mg via INTRAVENOUS
  Filled 2018-11-18: qty 2

## 2018-11-18 MED ORDER — FENTANYL CITRATE (PF) 100 MCG/2ML IJ SOLN: 50.0000 ug | INTRAMUSCULAR | Status: DC | PRN

## 2018-11-18 MED ORDER — SUCCINYLCHOLINE CHLORIDE 200 MG/10ML IV SOSY
PREFILLED_SYRINGE | INTRAVENOUS | Status: AC
  Filled 2018-11-18: qty 10

## 2018-11-18 MED ORDER — MEPERIDINE HCL 50 MG/ML IJ SOLN: 6.2500 mg | INTRAMUSCULAR | Status: DC | PRN

## 2018-11-18 MED ORDER — ONDANSETRON HCL 4 MG/2ML IJ SOLN
INTRAMUSCULAR | Status: AC
  Filled 2018-11-18: qty 4

## 2018-11-18 MED ORDER — LACTATED RINGERS IV SOLN
INTRAVENOUS | Status: DC | PRN
  Administered 2018-11-18: 06:00:00 via INTRAVENOUS

## 2018-11-18 MED ORDER — PHENYLEPHRINE 40 MCG/ML (10ML) SYRINGE FOR IV PUSH (FOR BLOOD PRESSURE SUPPORT)
PREFILLED_SYRINGE | INTRAVENOUS | Status: DC | PRN
  Administered 2018-11-18 (×6): 80 ug via INTRAVENOUS

## 2018-11-18 MED ORDER — DEXAMETHASONE SODIUM PHOSPHATE 10 MG/ML IJ SOLN
INTRAMUSCULAR | Status: DC | PRN
  Administered 2018-11-18: 10 mg via INTRAVENOUS

## 2018-11-18 MED ORDER — HYDROMORPHONE HCL 1 MG/ML IJ SOLN
1.0000 mg | Freq: Once | INTRAMUSCULAR | Status: AC
  Administered 2018-11-18: 1 mg via INTRAVENOUS
  Filled 2018-11-18: qty 1

## 2018-11-18 MED ORDER — SENNOSIDES-DOCUSATE SODIUM 8.6-50 MG PO TABS
1.0000 | ORAL_TABLET | Freq: Two times a day (BID) | ORAL | Status: DC
  Administered 2018-11-18 – 2018-11-20 (×5): 1 via ORAL
  Filled 2018-11-18 (×5): qty 1

## 2018-11-18 SURGICAL SUPPLY — 15 items
BAG URO CATCHER STRL LF (MISCELLANEOUS) ×2 IMPLANT
BASKET ZERO TIP NITINOL 2.4FR (BASKET) IMPLANT
CATH INTERMIT  6FR 70CM (CATHETERS) IMPLANT
CLOTH BEACON ORANGE TIMEOUT ST (SAFETY) ×2 IMPLANT
COVER WAND RF STERILE (DRAPES) IMPLANT
GLOVE BIOGEL M STRL SZ7.5 (GLOVE) ×2 IMPLANT
GOWN STRL REUS W/TWL LRG LVL3 (GOWN DISPOSABLE) ×4 IMPLANT
GUIDEWIRE ANG ZIPWIRE 038X150 (WIRE) ×2 IMPLANT
GUIDEWIRE STR DUAL SENSOR (WIRE) IMPLANT
KIT TURNOVER KIT A (KITS) IMPLANT
MANIFOLD NEPTUNE II (INSTRUMENTS) ×2 IMPLANT
PACK CYSTO (CUSTOM PROCEDURE TRAY) ×2 IMPLANT
STENT POLARIS 5FRX22 (STENTS) ×4 IMPLANT
TUBING CONNECTING 10 (TUBING) ×2 IMPLANT
TUBING UROLOGY SET (TUBING) IMPLANT

## 2018-11-18 NOTE — ED Triage Notes (Signed)
Arrived by [facility] from home with c/o left flank pain that started around 1500 and subsided, then pain returned tonight with nausea and vomiting. Patient reports hx of kidney stones about 2 years ago. Patient states when she had the kidney stone 2 years ago, she waited too long to get help and became septic. Patient received 50 mcg Fentanyl en rout to this facility. A&O X4.

## 2018-11-18 NOTE — ED Provider Notes (Signed)
[facility]-EMERGENCY DEPT Provider Note   CSN: [id number] Arrival date & time: 11/18/18  0024     History   Chief Complaint Chief Complaint  Patient presents with  . Flank Pain    HPI Festus Holtsaula M Loeper is a 48 y.o. female.     The history is provided by the patient.  Abdominal Pain Pain location:  RLQ Pain quality: sharp   Pain radiates to:  Does not radiate Pain severity:  Severe Onset quality:  Sudden Duration:  12 hours Timing:  Constant Progression:  Worsening Chronicity:  New Relieved by:  Nothing Worsened by:  Palpation Associated symptoms: nausea and vomiting   Associated symptoms: no chest pain, no cough, no dysuria, no fever and no shortness of breath    Patient with previous history of kidney stones, hypertension presents with abdominal pain She reports sudden onset of sharp right lower abdominal pain consistent with prior kidney stone.  She reports associated nausea vomiting.  She reports requiring previous urologic intervention for kidney stones. Past Medical History:  Diagnosis Date  . History of kidney stones   . Hypertension   . Hypothyroidism   . Thyroid disease     Patient Active Problem List   Diagnosis Date Noted  . Right ureteral stone 02/10/2016  . S/P carpal tunnel release 08/07/2012  . CTS (carpal tunnel syndrome) 06/27/2012  . ANKLE SPRAIN 02/24/2010  . PAIN IN JOINT, ANKLE AND FOOT 01/14/2010    Past Surgical History:  Procedure Laterality Date  . CARPAL TUNNEL RELEASE Right 07/24/2012   Procedure: CARPAL TUNNEL RELEASE;  Surgeon: Vickki HearingStanley E Harrison, MD;  Location: [facility];  Service: Orthopedics;  Laterality: Right;  Right Carpal Tunnel Release  . CYSTOSCOPY     x6 for stones-with stenting  . CYSTOSCOPY W/ URETERAL STENT PLACEMENT Bilateral 02/10/2016   Procedure: CYSTOSCOPY WITH RETROGRADE PYELOGRAM/BILATERAL URETERAL STENT PLACEMENT;  Surgeon: Sebastian Acheheodore [name], MD;  Location: [facility];  Service: Urology;  Laterality:  Bilateral;  . CYSTOSCOPY/URETEROSCOPY/HOLMIUM LASER/STENT PLACEMENT Bilateral 03/10/2016   Procedure: CYSTOSCOPY/BILATERAL URETEROSCOPY/ BILATERAL HOLMIUM LASER /BILATERALSTENT PLACEMENT/ BILATERAL RETOGRADE PYLEOGRAM;  Surgeon: Sebastian Acheheodore [name], MD;  Location: [facility];  Service: Urology;  Laterality: Bilateral;  . DILATION AND CURETTAGE OF UTERUS    . HYSTEROSCOPY N/A 09/22/2018   Procedure: HYSTEROSCOPY W/ IUD REMOVAL AND REPLACEMENT;  Surgeon: Myna Hidalgozan, Jennifer, DO;  Location: [facility];  Service: Gynecology;  Laterality: N/A;  . INTRAUTERINE DEVICE (IUD) INSERTION N/A 09/22/2018   Procedure: Intrauterine Device (Iud) Insertion;  Surgeon: Myna Hidalgozan, Jennifer, DO;  Location: [facility];  Service: Gynecology;  Laterality: N/A;  . IUD REMOVAL N/A 09/22/2018   Procedure: Intrauterine Device (Iud) Removal;  Surgeon: Myna Hidalgozan, Jennifer, DO;  Location: [facility];  Service: Gynecology;  Laterality: N/A;  . THYROIDECTOMY       OB History   No obstetric history on file.      Home Medications    Prior to Admission medications   Medication Sig Start Date End Date Taking? Authorizing Provider  amphetamine-dextroamphetamine (ADDERALL) 20 MG tablet Take 10 mg by mouth 2 (two) times daily with a meal.     [provider]  aspirin-acetaminophen-caffeine (EXCEDRIN MIGRAINE) 250-250-65 MG tablet Take 2 tablets by mouth every 6 (six) hours as needed for headache.    [provider]  hydrochlorothiazide (MICROZIDE) 12.5 MG capsule Take 12.5 mg by mouth daily.    [provider]  levonorgestrel (MIRENA) 20 MCG/24HR IUD 1 each by Intrauterine route once.    [provider]  levothyroxine (SYNTHROID, LEVOTHROID) 175 MCG tablet Take 175 mcg by mouth daily before breakfast.    [provider]  lisinopril (ZESTRIL) 10 MG tablet Take 10 mg by mouth daily.     [provider]  metroNIDAZOLE (FLAGYL) 500 MG tablet Take 500 mg by mouth 2 (two) times daily.    [provider]     Family History No family history on file.  Social History Social History   Tobacco Use  . Smoking status: Never Smoker  . Smokeless tobacco: Never Used  Substance Use Topics  . Alcohol use: Yes    Comment: occ  . Drug use: No     Allergies   Patient has no known allergies.   Review of Systems Review of Systems  Constitutional: Negative for fever.  Respiratory: Negative for cough and shortness of breath.   Cardiovascular: Negative for chest pain.  Gastrointestinal: Positive for abdominal pain, nausea and vomiting.  Genitourinary: Negative for dysuria.  All other systems reviewed and are negative.    Physical Exam Updated Vital Signs BP (!) 150/102 (BP Location: Right Arm)   Pulse (!) 106   Temp 100 F (37.8 C) (Oral)   Resp 20   Ht 1.651 m (5\' 5" )   Wt (!) 149.7 kg   SpO2 97%   BMI 54.91 kg/m   Physical Exam CONSTITUTIONAL: Well developed/well nourished uncomfortable appearing HEAD: Normocephalic/atraumatic EYES: EOMI  ENMT: Mucous membranes moist NECK: supple no meningeal signs SPINE/BACK:entire spine nontender CV: S1/S2 noted, no murmurs/rubs/gallops noted LUNGS: Lungs are clear to auscultation bilaterally, no apparent distress ABDOMEN: soft, mild RLQ tenderness, no rebound or guarding, bowel sounds noted throughout abdomen, exam limited due to obesity GU:no cva tenderness NEURO: Pt is awake/alert/appropriate, moves all extremitiesx4.  No facial droop.   EXTREMITIES: pulses normal/equal, full ROM SKIN: warm, color normal PSYCH: Mildly anxious  ED Treatments / Results  Labs (all labs ordered are listed, but only abnormal results are displayed) Labs Reviewed  URINALYSIS, ROUTINE W REFLEX MICROSCOPIC - Abnormal; Notable for the following components:      Result Value   APPearance HAZY (*)    Hgb urine dipstick SMALL (*)    Protein, ur 30 (*)    Nitrite POSITIVE (*)    Leukocytes,Ua LARGE (*)    WBC, UA >50 (*)    Bacteria, UA RARE (*)    All  other components within normal limits  CBC WITH DIFFERENTIAL/PLATELET - Abnormal; Notable for the following components:   WBC 13.5 (*)    Hemoglobin 15.7 (*)    HCT 48.7 (*)    Neutro Abs 12.0 (*)    All other components within normal limits  BASIC METABOLIC PANEL - Abnormal; Notable for the following components:   Glucose, Bld 157 (*)    Creatinine, Ser 1.64 (*)    GFR calc non Af Amer 37 (*)    GFR calc Af Amer 43 (*)    All other components within normal limits  URINE CULTURE  SARS CORONAVIRUS 2 (HOSPITAL ORDER, PERFORMED IN [facility])  I-STAT BETA HCG BLOOD, ED ([facility], [facility], [facility] ONLY)    EKG None  Radiology Ct Renal Stone Study  Result Date: 11/18/2018 CLINICAL DATA:  Flank pain EXAM: CT ABDOMEN AND PELVIS WITHOUT CONTRAST TECHNIQUE: Multidetector CT imaging of the abdomen and pelvis was performed following the standard protocol without IV contrast. COMPARISON:  02/10/2016 FINDINGS: LOWER CHEST: There is no basilar pleural or apical pericardial effusion. HEPATOBILIARY: The hepatic contours and  density are normal. There is no intra- or extrahepatic biliary dilatation. The gallbladder is normal. PANCREAS: The pancreatic parenchymal contours are normal and there is no ductal dilatation. There is no peripancreatic fluid collection. SPLEEN: Normal. ADRENALS/URINARY TRACT: --Adrenal glands: Normal. --Right kidney/ureter: There is moderate right hydroureteronephrosis with an obstructing stone measuring 8 mm in the proximal right ureter. Within the right renal pelvis, there is a 6 mm stone. There are multiple smaller nonobstructing renal calculi as well, measuring up to 6 mm. --Left kidney/ureter: Multiple nonobstructive calculi measuring up to 4 mm. --Urinary bladder: Normal for degree of distention STOMACH/BOWEL: --Stomach/Duodenum: There is no hiatal hernia or other gastric abnormality. The duodenal course and caliber are normal. --Small bowel: No dilatation or inflammation. --Colon:  No focal abnormality. --Appendix: Normal. VASCULAR/LYMPHATIC: Normal course and caliber of the major abdominal vessels. No abdominal or pelvic lymphadenopathy. REPRODUCTIVE: There is a T-shaped contraceptive device within the uterus. MUSCULOSKELETAL. No bony spinal canal stenosis or focal osseous abnormality. OTHER: None. IMPRESSION: 1. Right obstructive uropathy with proximal ureteral stone measuring 8 mm causing moderate hydroureteronephrosis. There are multiple other nonobstructing stones of the right kidney and renal pelvis. 2. Nonobstructive left nephrolithiasis. Electronically Signed   By: Ulyses Jarred M.D.   On: 11/18/2018 03:01    Procedures Procedures   Medications Ordered in ED Medications  cefTRIAXone (ROCEPHIN) 1 g in sodium chloride 0.9 % 100 mL IVPB (has no administration in time range)  ondansetron (ZOFRAN) injection 4 mg (4 mg Intravenous Given 11/18/18 0126)  fentaNYL (SUBLIMAZE) injection 100 mcg (100 mcg Intravenous Given 11/18/18 0122)  HYDROmorphone (DILAUDID) injection 1 mg (1 mg Intravenous Given 11/18/18 0224)  sodium chloride 0.9 % bolus 1,000 mL (1,000 mLs Intravenous New Bag/Given 11/18/18 0257)  ketorolac (TORADOL) 30 MG/ML injection 15 mg (15 mg Intravenous Given 11/18/18 0356)     Initial Impression / Assessment and Plan / ED Course  I have reviewed the triage vital signs and the nursing notes.  Pertinent labs & imaging results that were available during my care of the patient were reviewed by me and considered in my medical decision making (see chart for details).        3:41 AM Patient found to have large proximal ureteral stone.  She reports continued pain. Awaiting urinalysis.  Will give Toradol.  She has been given IV fluids.  Repeat temperature is 98 4:30 AM Patient found to have infected ureteral stone.  She is still having pain. IV have antibiotics been ordered. Discussed the case with Dr. Tresa Moore, who will take patient to the operating room Final  Clinical Impressions(s) / ED Diagnoses   Final diagnoses:  Ureteral stone with hydronephrosis  Pyelonephritis    ED Discharge Orders    None       Ripley Fraise, MD 11/18/18 [phone]

## 2018-11-18 NOTE — H&P (Signed)
Rebecca [name] is an 48 y.o. female.    Chief Complaint: Recurrent Urolithiasis, Medical Stone Disease, Pyelonephritis, Acute Renal Failure  HPI:  1 - Recurrent Urolithiasis -  Pre 2020 - innumerable stone events, URS, MET 11/2018 - Rt 73m prox stone with mild hydro (SSD 22cm, 850HU) with R>L scattered renal stones (about 1cm rt, 872mlt total vol)  2 - Medical Stone Disease -  Eval 2020: BMP, PTH, Urate - pending; Composition - pending; 24 Hr Urines - pending  3 - Pyelonephritis - fevers to 101, tachycardia UA nitrite + c/w early pyelo in setting of obstrutin stone. Placed on empiric rocephin. UCX 8/15 pending  4 - Acute Renal Failure - Cr 1.6 up from baseline of 1.0. Right ureteral stoe as per above.   PMH sig for morbid obesity, hypothyroid. Her PCP is AaLondon PepperD with [facility] Today "PaChelesais seen for urgent evaluation of Rt ureteral stone with infection. Last meal >12 hour ago.   Past Medical History:  Diagnosis Date  . History of kidney stones   . Hypertension   . Hypothyroidism   . Thyroid disease     Past Surgical History:  Procedure Laterality Date  . CARPAL TUNNEL RELEASE Right 07/24/2012   Procedure: CARPAL TUNNEL RELEASE;  Surgeon: StCarole CivilMD;  Location: [facility];  Service: Orthopedics;  Laterality: Right;  Right Carpal Tunnel Release  . CYSTOSCOPY     x6 for stones-with stenting  . CYSTOSCOPY W/ URETERAL STENT PLACEMENT Bilateral 02/10/2016   Procedure: CYSTOSCOPY WITH RETROGRADE PYELOGRAM/BILATERAL URETERAL STENT PLACEMENT;  Surgeon: ThAlexis FrockMD;  Location: [facility];  Service: Urology;  Laterality: Bilateral;  . CYSTOSCOPY/URETEROSCOPY/HOLMIUM LASER/STENT PLACEMENT Bilateral 03/10/2016   Procedure: CYSTOSCOPY/BILATERAL URETEROSCOPY/ BILATERAL HOLMIUM LASER /BILATERALSTENT PLACEMENT/ BILATERAL RETOGRADE PYLEOGRAM;  Surgeon: ThAlexis FrockMD;  Location: [facility];  Service: Urology;  Laterality: Bilateral;  . DILATION AND CURETTAGE OF UTERUS    .  HYSTEROSCOPY N/A 09/22/2018   Procedure: HYSTEROSCOPY W/ IUD REMOVAL AND REPLACEMENT;  Surgeon: OzJanyth PupaDO;  [facility] Service: Gynecology;  Laterality: N/A;  . INTRAUTERINE DEVICE (IUD) INSERTION N/A 09/22/2018   Procedure: Intrauterine Device (Iud) Insertion;  Surgeon: OzJanyth PupaDO;  [facility] Service: [facility]necology;  Laterality: N/A;  . IUD REMOVAL N/A 09/22/2018   Procedure: Intrauterine Device (Iud) Removal;  Surgeon: OzJanyth PupaDO;  [facility] Service: Gynecology;  Laterality: N/A;  . THYROIDECTOMY      No family history on file. Social History:  reports that she has never smoked. She has never used smokeless tobacco. She reports current alcohol use. She reports that she does not use drugs.  Allergies: No Known Allergies  (Not in a hospital admission)   Results for orders placed or performed during the hospital encounter of 11/18/18 (from the past 48 hour(s))  CBC with Differential/Platelet     Status: Abnormal   Collection Time: 11/18/18 12:55 AM  Result Value Ref Range   WBC 13.5 (H) 4.0 - 10.5 K/uL   RBC 5.10 3.87 - 5.11 MIL/uL   Hemoglobin 15.7 (H) 12.0 - 15.0 g/dL   HCT 48.7 (H) 36.0 - 46.0 %   MCV 95.5 80.0 - 100.0 fL   MCH 30.8 26.0 - 34.0 pg   MCHC 32.2 30.0 - 36.0 g/dL   RDW 13.7 11.5 - 15.5 %   Platelets 301 150 - 400 K/uL   nRBC 0.0 0.0 - 0.2 %   Neutrophils Relative % 90 %  Neutro Abs 12.0 (H) 1.7 - 7.7 K/uL   Lymphocytes Relative 8 %   Lymphs Abs 1.1 0.7 - 4.0 K/uL   Monocytes Relative 2 %   Monocytes Absolute 0.3 0.1 - 1.0 K/uL   Eosinophils Relative 0 %   Eosinophils Absolute 0.0 0.0 - 0.5 K/uL   Basophils Relative 0 %   Basophils Absolute 0.1 0.0 - 0.1 K/uL   Immature Granulocytes 0 %   Abs Immature Granulocytes 0.03 0.00 - 0.07 K/uL    Comment: Performed at [facility], [place] [address] [place], [place] [address]  Basic metabolic panel     Status: Abnormal   Collection Time: 11/18/18 12:55 AM   Result Value Ref Range   Sodium 141 135 - 145 mmol/L   Potassium 4.1 3.5 - 5.1 mmol/L   Chloride 107 98 - 111 mmol/L   CO2 24 22 - 32 mmol/L   Glucose, Bld 157 (H) 70 - 99 mg/dL   BUN 19 6 - 20 mg/dL   Creatinine, Ser 1.64 (H) 0.44 - 1.00 mg/dL   Calcium 9.3 8.9 - 10.3 mg/dL   GFR calc non Af Amer 37 (L) >60 mL/min   GFR calc Af Amer 43 (L) >60 mL/min   Anion gap 10 5 - 15    Comment: Performed at [facility], [place] [address], [place], [place] [address]  I-Stat beta hCG blood, ED     Status: None   Collection Time: 11/18/18  1:25 AM  Result Value Ref Range   I-stat hCG, quantitative <5.0 <5 mIU/mL   Comment 3            Comment:   GEST. AGE      CONC.  (mIU/mL)   <=1 WEEK        5 - 50     2 WEEKS       50 - 500     3 WEEKS       100 - 10,000     4 WEEKS     1,000 - 30,000        FEMALE AND NON-PREGNANT FEMALE:     LESS THAN 5 mIU/mL   Urinalysis, Routine w reflex microscopic- may I&O cath if menses     Status: Abnormal   Collection Time: 11/18/18  3:41 AM  Result Value Ref Range   Color, Urine YELLOW YELLOW   APPearance HAZY (A) CLEAR   Specific Gravity, Urine 1.020 1.005 - 1.030   pH 5.0 5.0 - 8.0   Glucose, UA NEGATIVE NEGATIVE mg/dL   Hgb urine dipstick SMALL (A) NEGATIVE   Bilirubin Urine NEGATIVE NEGATIVE   Ketones, ur NEGATIVE NEGATIVE mg/dL   Protein, ur 30 (A) NEGATIVE mg/dL   Nitrite POSITIVE (A) NEGATIVE   Leukocytes,Ua LARGE (A) NEGATIVE   RBC / HPF 6-10 0 - 5 RBC/hpf   WBC, UA >50 (H) 0 - 5 WBC/hpf   Bacteria, UA RARE (A) NONE SEEN   Squamous Epithelial / LPF 6-10 0 - 5   Mucus PRESENT     Comment: Performed at [facility], [place] [address], [place], [place] [address]   Ct Renal Stone Study  Result Date: 11/18/2018 CLINICAL DATA:  Flank pain EXAM: CT ABDOMEN AND PELVIS WITHOUT CONTRAST TECHNIQUE: Multidetector CT imaging of the abdomen and pelvis was performed following the standard protocol without IV contrast.  COMPARISON:  02/10/2016 FINDINGS: LOWER CHEST: There is no basilar pleural or apical pericardial effusion. HEPATOBILIARY: The  hepatic contours and density are normal. There is no intra- or extrahepatic biliary dilatation. The gallbladder is normal. PANCREAS: The pancreatic parenchymal contours are normal and there is no ductal dilatation. There is no peripancreatic fluid collection. SPLEEN: Normal. ADRENALS/URINARY TRACT: --Adrenal glands: Normal. --Right kidney/ureter: There is moderate right hydroureteronephrosis with an obstructing stone measuring 8 mm in the proximal right ureter. Within the right renal pelvis, there is a 6 mm stone. There are multiple smaller nonobstructing renal calculi as well, measuring up to 6 mm. --Left kidney/ureter: Multiple nonobstructive calculi measuring up to 4 mm. --Urinary bladder: Normal for degree of distention STOMACH/BOWEL: --Stomach/Duodenum: There is no hiatal hernia or other gastric abnormality. The duodenal course and caliber are normal. --Small bowel: No dilatation or inflammation. --Colon: No focal abnormality. --Appendix: Normal. VASCULAR/LYMPHATIC: Normal course and caliber of the major abdominal vessels. No abdominal or pelvic lymphadenopathy. REPRODUCTIVE: There is a T-shaped contraceptive device within the uterus. MUSCULOSKELETAL. No bony spinal canal stenosis or focal osseous abnormality. OTHER: None. IMPRESSION: 1. Right obstructive uropathy with proximal ureteral stone measuring 8 mm causing moderate hydroureteronephrosis. There are multiple other nonobstructing stones of the right kidney and renal pelvis. 2. Nonobstructive left nephrolithiasis. Electronically Signed   By: Ulyses Jarred M.D.   On: 11/18/2018 03:01    Review of Systems  Constitutional: Positive for chills, fever and malaise/fatigue.  Cardiovascular: Negative.  Negative for chest pain.  Genitourinary: Positive for flank pain and frequency.  All other systems reviewed and are  negative.   Blood pressure 127/85, pulse 94, temperature 100 F (37.8 C), temperature source Oral, resp. rate (!) 29, height _0  (1.651 m), weight (!) 149.7 kg, SpO2 96 %. Physical Exam  Constitutional: She appears well-developed.  HENT:  Head: Normocephalic.  Eyes: Pupils are equal, round, and reactive to light.  Neck: Normal range of motion.  Cardiovascular:  Mild regular tachydardia at present  Respiratory: Effort normal.  GI:  Stable morbid truncal obesity  Genitourinary:    Genitourinary Comments: Mild Rt CVAT at present   Neurological: She is alert.  Skin: Skin is warm.  Psychiatric: She has a normal mood and affect.     Assessment/Plan  1 - Recurrent Urolithiasis - rec urgent renal decompression today with bilateral stents, then bilateral ureteroscopy in elective setting in few weeks after clears infectious parameters. Admit post-op for IV ABX with DC home when afebrile x 24 hours.   2 - Medical Stone Disease - complete eval in elective setting as she is high risk stone former.   3 - Pyelonephritis - continue rocephin pending furhter CX data and renal decompression as per above.   4 - Acute Renal Failure - Likely from unilateral obstruciton in setting of poor PO intake, this should quickly resolve with plan above.    Alexis Frock, MD 11/18/2018, 4:33 AM

## 2018-11-18 NOTE — Anesthesia Preprocedure Evaluation (Signed)
Anesthesia Evaluation  Patient identified by MRN, date of birth, ID band Patient awake    Reviewed: Allergy & Precautions, NPO status , Patient's Chart, lab work & pertinent test results  Airway Mallampati: II       Dental  (+) Teeth Intact   Pulmonary    Pulmonary exam normal        Cardiovascular hypertension, Pt. on medications Normal cardiovascular exam Rhythm:Regular Rate:Normal     Neuro/Psych    GI/Hepatic negative GI ROS, Neg liver ROS,   Endo/Other  Hypothyroidism Morbid obesity  Renal/GU negative Renal ROS     Musculoskeletal negative musculoskeletal ROS (+)   Abdominal (+) + obese,   Peds  Hematology   Anesthesia Other Findings   Reproductive/Obstetrics                             Anesthesia Physical  Anesthesia Plan  ASA: III and emergent  Anesthesia Plan: General   Post-op Pain Management:    Induction: Intravenous  PONV Risk Score and Plan: Ondansetron, Dexamethasone, Midazolam and Scopolamine patch - Pre-op  Airway Management Planned: Oral ETT  Additional Equipment:   Intra-op Plan:   Post-operative Plan: Extubation in OR  Informed Consent: I have reviewed the patients History and Physical, chart, labs and discussed the procedure including the risks, benefits and alternatives for the proposed anesthesia with the patient or authorized representative who has indicated his/her understanding and acceptance.     Dental advisory given  Plan Discussed with: CRNA  Anesthesia Plan Comments: ( )        Anesthesia Quick Evaluation

## 2018-11-18 NOTE — Anesthesia Procedure Notes (Addendum)
Procedure Name: Intubation Date/Time: 11/18/2018 7:15 AM Performed by: Lavina Hamman, CRNA Pre-anesthesia Checklist: Patient identified, Emergency Drugs available, Suction available, Patient being monitored and Timeout performed Patient Re-evaluated:Patient Re-evaluated prior to induction Oxygen Delivery Method: Circle system utilized Preoxygenation: Pre-oxygenation with 100% oxygen Induction Type: IV induction, Rapid sequence and Cricoid Pressure applied Laryngoscope Size: Mac and 3 Grade View: Grade I Tube type: Oral Tube size: 7.0 mm Number of attempts: 1 Airway Equipment and Method: Stylet Placement Confirmation: ETT inserted through vocal cords under direct vision,  positive ETCO2,  CO2 detector and breath sounds checked- equal and bilateral Secured at: 21 cm Tube secured with: Tape Dental Injury: Teeth and Oropharynx as per pre-operative assessment

## 2018-11-18 NOTE — Anesthesia Postprocedure Evaluation (Signed)
Anesthesia Post Note  Patient: Rebecca [name]  Procedure(s) Performed: CYSTOSCOPY WITH RETROGRADE PYELOGRAM/URETERAL STENT PLACEMENT (Bilateral Ureter)     Patient location during evaluation: PACU Anesthesia Type: General Level of consciousness: awake and alert Pain management: pain level controlled Vital Signs Assessment: post-procedure vital signs reviewed and stable Respiratory status: spontaneous breathing, nonlabored ventilation, respiratory function stable and patient connected to nasal cannula oxygen Cardiovascular status: blood pressure returned to baseline and stable Postop Assessment: no apparent nausea or vomiting Anesthetic complications: no    Last Vitals:  Vitals:   11/18/18 0830 11/18/18 0852  BP: 104/68 110/68  Pulse: 85 75  Resp: 19 20  Temp: 37 C 37.2 C  SpO2: 95% 94%    Last Pain:  Vitals:   11/18/18 0852  TempSrc: Oral  PainSc:                  Montez Hageman

## 2018-11-18 NOTE — Brief Op Note (Signed)
11/18/2018  7:31 AM  PATIENT:  Rebecca [name]  48 y.o. female  PRE-OPERATIVE DIAGNOSIS:  Bilateral ureteral stones  POST-OPERATIVE DIAGNOSIS:  bilateral ureteral stones  PROCEDURE:  Procedure(s): CYSTOSCOPY WITH RETROGRADE PYELOGRAM/URETERAL STENT PLACEMENT (Bilateral)  SURGEON:  Surgeon(s) and Role:    * Alexis Frock, MD - Primary  PHYSICIAN ASSISTANT:   ASSISTANTS: none   ANESTHESIA:   general  EBL:  Minimal    BLOOD ADMINISTERED:none  DRAINS: none   LOCAL MEDICATIONS USED:  NONE  SPECIMEN:  No Specimen  DISPOSITION OF SPECIMEN:  N/A  COUNTS:  YES  TOURNIQUET:  * No tourniquets in log *  DICTATION: .Other Dictation: Dictation Number [id number]  PLAN OF CARE: Admit to inpatient   PATIENT DISPOSITION:  PACU - hemodynamically stable.   Delay start of Pharmacological VTE agent (>24hrs) due to surgical blood loss or risk of bleeding: yes

## 2018-11-18 NOTE — Op Note (Signed)
NAME: Rebecca [name], PARTRIDGE MEDICAL RECORD [id number] ACCOUNT [id number] DATE OF BIRTH:06/29/1970 FACILITY: [facility] LOCATION: [facility] PHYSICIAN:Vermon Grays Tresa Moore, MD  OPERATIVE REPORT  DATE OF PROCEDURE:  11/18/2018  PREOPERATIVE DIAGNOSES:  Right ureteral bilateral renal stones, pyelonephritis.  PROCEDURE: 1.  Cystoscopy, bilateral retrograde pyelograms, interpretation. 2.  Bilateral ureteral stent placement 5 x 22 Polaris, no tether.  ESTIMATED BLOOD LOSS:  Nil.  COMPLICATIONS:  None.  SPECIMEN:  None.  FINDINGS: 1.  Mild bladder edema and cloudy urine consistent with bacteriuria. 2.  Mild right hydronephrosis with filling defect in proximal ureter consistent with known stone. 3.  Successful placement of bilateral ureteral stents, proximal end in the renal pelvis, distal end in urinary bladder.  INDICATIONS:  The patient is a pleasant, but somewhat comorbid 48 year old lady with history of recurrent urolithiasis.  She was found on workup for colicky flank pain and fevers to have a right proximal ureteral stone, significant bacteriuria, and some  mild right perinephric stranding consistent with early obstructing pyelonephritis.  Options were discussed including recommended path of renal decompression followed by antibiotics and clearance of infectious parameters for definitive stone management,  and she presents for renal decompression with bilateral stents today as she does have bilateral stones.  She desired ultimate goal of stone free.  Informed consent was obtained and placed in the medical record.  PROCEDURE IN DETAIL:  The patient being identified and surgical procedure being bilateral ureteral stent placement confirmed.  Procedure timeout was performed.  IV antibiotics were administered.  General anesthesia was induced.  The patient was placed  into a low lithotomy position.  The prepubic fat pad was taped out of the operative field.  A sterile field was created by prepping and  draped the patient's vagina, introitus and proximal thighs using iodine.  Cystourethroscopy was performed using  21-French rigid cystoscope offset lens.  Inspection of the urinary bladder revealed some mild edema and erythema without papillary lesions or calcifications.  Ureteral orifices were singleton bilaterally.  The left ureteral orifice was cannulated with a  6-French renal catheter, and left retrograde pyelogram was obtained.  Left retrograde pyelogram demonstrated a single left ureter and single-system left kidney.  No filling defects or narrowing noted.  A 0.038 ZIPwire was advanced to the lower pole of which a new 5 x 22 Polaris-type stent was placed using cystoscopic and  fluoroscopic guidance.  Good proximal and distal planes were noted.  Efflux of urine was seen around into the distal end of the stent.  Similarly, a right retrograde pyelogram was obtained.  Right retrograde pyelogram demonstrated a single right ureter, single-system right kidney.  There was a filling defect in the proximal ureter consistent with known stone.  There was mild hydronephrosis above this.  A 0.038 ZIPwire was advanced to the  lower pole, over which a new 5 x 24 Polaris-type stent was placed.  Good proximal and distal plane were noted.  Efflux of urine was seen around the distal end of the stent.  As the patient has only moderate infectious parameters present, it was not felt  that Foley catheterization would be warranted.  As such, the bladder was emptied per cystoscope and procedure was terminated.  The patient tolerated the procedure well.  No immediate perioperative complications.  The patient was taken to postanesthesia  care in stable condition with plan for hospital admission for verifying clearance of infectious parameters before discharge home.  LN/NUANCE  D:11/18/2018 T:11/18/2018 JOB:[id number]

## 2018-11-18 NOTE — ED Notes (Signed)
Called main lab to inquire about status of lab work  Spoke with Merrilyn Puma  She stated that when labs were received there was no order but they will be run/resulted shortly

## 2018-11-18 NOTE — Transfer of Care (Signed)
Immediate Anesthesia Transfer of Care Note  Patient: Rebecca [name]  Procedure(s) Performed: Procedure(s): CYSTOSCOPY WITH RETROGRADE PYELOGRAM/URETERAL STENT PLACEMENT (Bilateral)  Patient Location: PACU  Anesthesia Type:General  Level of Consciousness:  sedated, patient cooperative and responds to stimulation  Airway & Oxygen Therapy:Patient Spontanous Breathing and Patient connected to face mask oxgen  Post-op Assessment:  Report given to PACU RN and Post -op Vital signs reviewed and stable  Post vital signs:  Reviewed and stable  Last Vitals:  Vitals:   11/18/18 0530 11/18/18 0600  BP: 138/85 132/87  Pulse: (!) 104 98  Resp: (!) 26 (!) 27  Temp:    SpO2: 67% 20%    Complications: No apparent anesthesia complications

## 2018-11-19 ENCOUNTER — Encounter (HOSPITAL_COMMUNITY): Payer: Self-pay | Admitting: Urology

## 2018-11-19 ENCOUNTER — Other Ambulatory Visit: Payer: Self-pay

## 2018-11-19 MED ORDER — HYDROCHLOROTHIAZIDE 12.5 MG PO CAPS
12.5000 mg | ORAL_CAPSULE | Freq: Every day | ORAL | Status: DC
  Administered 2018-11-19 – 2018-11-20 (×2): 12.5 mg via ORAL
  Filled 2018-11-19 (×3): qty 1

## 2018-11-19 MED ORDER — LEVOTHYROXINE SODIUM 75 MCG PO TABS
175.0000 ug | ORAL_TABLET | Freq: Every day | ORAL | Status: DC
  Administered 2018-11-19 – 2018-11-20 (×2): 175 ug via ORAL
  Filled 2018-11-19 (×2): qty 1

## 2018-11-19 NOTE — Progress Notes (Signed)
1 Day Post-Op   Subjective/Chief Complaint:   1 - Recurrent Urolithiasis -  Pre 2020 - innumerable stone events, URS, MET 11/2018 - Bilateral stents for Rt 33m prox stone with mild hydro (SSD 22cm, 850HU) with R>L scattered renal stones (about 1cm rt, 8104mlt total vol)  2 - Pyelonephritis - fevers to 101, tachycardia UA nitrite + c/w early pyelo in setting of obstrutin stone. Placed on empiric rocephin. UCX 8/15 pending  3 - Acute Renal Failure - Cr 1.6 up from baseline of 1.0. Right ureteral stone as per above.   Today "PaDanayeis improving. Fever curve trending down on rocephin.   Objective: Vital signs in last 24 hours: Temp:  [97.5 F (36.4 C)-99.4 F (37.4 C)] 98.5 F (36.9 C) (08/16 0744) Pulse Rate:  [64-87] 79 (08/16 0744) Resp:  [16-22] 16 (08/16 0744) BP: (99-150)/(58-94) 116/64 (08/16 0744) SpO2:  [93 %-100 %] 98 % (08/16 0744) Last BM Date: 11/17/18  Intake/Output from previous day: 08/15 0701 - 08/16 0700 In: 2708.2 [P.O.:800; I.V.:1861.7; IV Piggyback:46.5] Out: 3350 [Urine:3350] Intake/Output this shift: No intake/output data recorded.   Physical Exam  Constitutional: She appears well-developed. Less malaise and better spirits today.  HENT:  Head: Normocephalic.  Eyes: Pupils are equal, round, and reactive to light.  Neck: Normal range of motion.  Cardiovascular: Nl rate Respiratory: Effort normal.  GI: Stable morbid truncal obesity  Genitourinary: No CVAT Neurological: She is alert.  Skin: Skin is warm.  Psychiatric: She has a normal mood and affect.   Lab Results:  Recent Labs    11/18/18 0055  WBC 13.5*  HGB 15.7*  HCT 48.7*  PLT 301   BMET Recent Labs    11/18/18 0055  NA 141  K 4.1  CL 107  CO2 24  GLUCOSE 157*  BUN 19  CREATININE 1.64*  CALCIUM 9.3   PT/INR No results for input(s): LABPROT, INR in the last 72 hours. ABG No results for input(s): PHART, HCO3 in the last 72 hours.  Invalid input(s): PCO2,  PO2  Studies/Results: Dg C-arm 1-60 Min-no Report  Result Date: 11/18/2018 Fluoroscopy was utilized by the requesting physician.  No radiographic interpretation.   Ct Renal Stone Study  Result Date: 11/18/2018 CLINICAL DATA:  Flank pain EXAM: CT ABDOMEN AND PELVIS WITHOUT CONTRAST TECHNIQUE: Multidetector CT imaging of the abdomen and pelvis was performed following the standard protocol without IV contrast. COMPARISON:  02/10/2016 FINDINGS: LOWER CHEST: There is no basilar pleural or apical pericardial effusion. HEPATOBILIARY: The hepatic contours and density are normal. There is no intra- or extrahepatic biliary dilatation. The gallbladder is normal. PANCREAS: The pancreatic parenchymal contours are normal and there is no ductal dilatation. There is no peripancreatic fluid collection. SPLEEN: Normal. ADRENALS/URINARY TRACT: --Adrenal glands: Normal. --Right kidney/ureter: There is moderate right hydroureteronephrosis with an obstructing stone measuring 8 mm in the proximal right ureter. Within the right renal pelvis, there is a 6 mm stone. There are multiple smaller nonobstructing renal calculi as well, measuring up to 6 mm. --Left kidney/ureter: Multiple nonobstructive calculi measuring up to 4 mm. --Urinary bladder: Normal for degree of distention STOMACH/BOWEL: --Stomach/Duodenum: There is no hiatal hernia or other gastric abnormality. The duodenal course and caliber are normal. --Small bowel: No dilatation or inflammation. --Colon: No focal abnormality. --Appendix: Normal. VASCULAR/LYMPHATIC: Normal course and caliber of the major abdominal vessels. No abdominal or pelvic lymphadenopathy. REPRODUCTIVE: There is a T-shaped contraceptive device within the uterus. MUSCULOSKELETAL. No bony spinal canal stenosis or focal osseous abnormality.  OTHER: None. IMPRESSION: 1. Right obstructive uropathy with proximal ureteral stone measuring 8 mm causing moderate hydroureteronephrosis. There are multiple other  nonobstructing stones of the right kidney and renal pelvis. 2. Nonobstructive left nephrolithiasis. Electronically Signed   By: Ulyses Jarred M.D.   On: 11/18/2018 03:01    Anti-infectives: Anti-infectives (From admission, onward)   Start     Dose/Rate Route Frequency Ordered Stop   11/19/18 0600  cefTRIAXone (ROCEPHIN) 2 g in sodium chloride 0.9 % 100 mL IVPB     2 g 200 mL/hr over 30 Minutes Intravenous Every 24 hours 11/18/18 0855     11/18/18 0645  sodium chloride 0.9 % with cefTRIAXone (ROCEPHIN) ADS Med    Note to Pharmacy: Enrigue Catena   : cabinet override      11/18/18 0645 11/18/18 1859   11/18/18 0430  cefTRIAXone (ROCEPHIN) 1 g in sodium chloride 0.9 % 100 mL IVPB     1 g 200 mL/hr over 30 Minutes Intravenous  Once 11/18/18 0418 11/18/18 0503      Assessment/Plan:  1 - Recurrent Urolithiasis - now temporized with bilat stents, will arrange for outpatient bilateral ureterocopy in elective setting in few weeks.   2 - Pyelonephritis - improving on current ABX, continue.   3 - Acute Renal Failure - BMP in AM tomorrow to verify trending back to basleine.  Remain in house, likely DC in AM tomorrow as long as fever curve remains favorable.   Alexis Frock 11/19/2018

## 2018-11-20 LAB — BASIC METABOLIC PANEL
Anion gap: 8 (ref 5–15)
BUN: 18 mg/dL (ref 6–20)
CO2: 26 mmol/L (ref 22–32)
Calcium: 8.3 mg/dL — ABNORMAL LOW (ref 8.9–10.3)
Chloride: 105 mmol/L (ref 98–111)
Creatinine, Ser: 1.2 mg/dL — ABNORMAL HIGH (ref 0.44–1.00)
GFR calc Af Amer: >60 mL/min (ref >60)
GFR calc non Af Amer: 54 mL/min — ABNORMAL LOW (ref >60)
Glucose, Bld: 143 mg/dL — ABNORMAL HIGH (ref 70–99)
Potassium: 4 mmol/L (ref 3.5–5.1)
Sodium: 139 mmol/L (ref 135–145)

## 2018-11-20 LAB — URINE CULTURE: Culture: >=100000 — AB

## 2018-11-20 MED ORDER — CEFPODOXIME PROXETIL 200 MG PO TABS: 200.0000 mg | ORAL_TABLET | Freq: Two times a day (BID) | ORAL | 0 refills | Status: DC

## 2018-11-20 MED ORDER — OXYCODONE-ACETAMINOPHEN 5-325 MG PO TABS: 1.0000 | ORAL_TABLET | Freq: Four times a day (QID) | ORAL | 0 refills | Status: DC | PRN

## 2018-11-20 MED ORDER — SENNOSIDES-DOCUSATE SODIUM 8.6-50 MG PO TABS: 1.0000 | ORAL_TABLET | Freq: Two times a day (BID) | ORAL | 0 refills | Status: DC

## 2018-11-20 NOTE — Discharge Summary (Signed)
Physician Discharge Summary  Patient ID: Rebecca [name] MRN: [id number] DOB/AGE: Jan 15, 1971 48 y.o.  Admit date: 11/18/2018 Discharge date: 11/20/2018  Admission Diagnoses: Ureteral stone, pyelonephritis, acute renal failure  Discharge Diagnoses:  Active Problems:   Ureteral stone with hydronephrosis   Discharged Condition: good  Hospital Course:   1 - Recurrent Urolithiasis -  Pre 2020 -innumerable stone events, URS, MET 11/2018 - Bilateral stents for Rt 6m prox stone with mild hydro (SSD 22cm, 850HU) with R>L scattered renal stones (about 1cm rt, 823mlt total vol)  2 - Pyelonephritis- fevers to 101, tachycardia UA nitrite + c/w early pyelo in setting of obstrutin stone. Placed on empiric rocephin. UCX 8/15 proteus. Afebrile x 24 hours at discharge.   3 - Acute Renal Failure- Cr 1.6 up from baseline of 1.0. Right ureteral stone as per above and stented. Cr 1.2 at discharge.   By the AM of 8/17, her pain is controlled, ambulatory, afebrile x 24 hour, and felt to be adequate for discharge.    Consults: None  Significant Diagnostic Studies: labs: as per above  Treatments: surgery: as per above  Discharge Exam: Blood pressure 108/60, pulse 81, temperature 98.1 F (36.7 C), temperature source Oral, resp. rate 20, height _0  (1.651 m), weight (!) 149.7 kg, SpO2 95 %.  Physical Exam Constitutional: She appearswell-developed. Less malaise and better spirits today.  HENT:  Head:Normocephalic.  Eyes:Pupils are equal, round, and reactive to light.  Neck:Normal range of motion.  Cardiovascular:Nl rate Respiratory:Effort normal.  GIAQ:LRJPVGorbid truncal obesity Genitourinary: No CVAT Neurological: She is alert.  Skin: Skin iswarm.  Psychiatric: She has anormal mood and affect.   Disposition:    Allergies as of 11/20/2018   No Known Allergies     Medication List    STOP taking these medications   aspirin-acetaminophen-caffeine 250-250-65 MG  tablet Commonly known as: EXCEDRIN MIGRAINE     TAKE these medications   amphetamine-dextroamphetamine 20 MG tablet Commonly known as: ADDERALL Take 10-20 mg by mouth See admin instructions. 20 mg in the am and 10 mg in the evening   cefpodoxime 200 MG tablet Commonly known as: VANTIN Take 1 tablet (200 mg total) by mouth 2 (two) times daily. X 3 days now. Also start 2 days before next Urology surgery.   hydrochlorothiazide 12.5 MG capsule Commonly known as: MICROZIDE Take 12.5 mg by mouth daily.   ibuprofen 200 MG tablet Commonly known as: ADVIL Take 400-600 mg by mouth every 6 (six) hours as needed for moderate pain.   levonorgestrel 20 MCG/24HR IUD Commonly known as: MIRENA 1 each by Intrauterine route once.   levothyroxine 175 MCG tablet Commonly known as: SYNTHROID Take 175 mcg by mouth daily before breakfast.   lisinopril 40 MG tablet Commonly known as: ZESTRIL Take 40 mg by mouth daily.   oxyCODONE-acetaminophen 5-325 MG tablet Commonly known as: Percocet Take 1 tablet by mouth every 6 (six) hours as needed for moderate pain or severe pain. Post-operatively   senna-docusate 8.6-50 MG tablet Commonly known as: Senokot-S Take 1 tablet by mouth 2 (two) times daily. While taking strong pain meds to prevent constipation        Signed: ThAlexis Frock/17/2020, 7:36 AM

## 2018-11-20 NOTE — Discharge Instructions (Signed)
1 - You may have urinary urgency (bladder spasms) and bloody urine on / off with stent in place. This is normal. ° °2 - Call MD or go to ER for fever >102, severe pain / nausea / vomiting not relieved by medications, or acute change in medical status ° °

## 2018-11-20 NOTE — Plan of Care (Signed)
Patient d/c to home. Transported via wheelchair. All questions were addressed and answered .

## 2018-11-22 ENCOUNTER — Other Ambulatory Visit: Payer: Self-pay | Admitting: Urology

## 2018-11-23 ENCOUNTER — Other Ambulatory Visit (HOSPITAL_COMMUNITY)
Admission: RE | Admit: 2018-11-23 | Discharge: 2018-11-23 | Disposition: A | Discharge status: Home / Self Care | Payer: BC Managed Care – PPO | Source: Ambulatory Visit | Attending: Urology | Admitting: Urology

## 2018-11-23 ENCOUNTER — Other Ambulatory Visit: Payer: Self-pay

## 2018-11-23 ENCOUNTER — Encounter (HOSPITAL_COMMUNITY): Payer: Self-pay | Admitting: *Deleted

## 2018-11-23 LAB — SARS CORONAVIRUS 2 (TAT 6-24 HRS): SARS Coronavirus 2: NEGATIVE

## 2018-11-24 ENCOUNTER — Ambulatory Visit (HOSPITAL_COMMUNITY): Payer: BC Managed Care – PPO

## 2018-11-24 ENCOUNTER — Ambulatory Visit (HOSPITAL_COMMUNITY)
Admission: RE | Admit: 2018-11-24 | Discharge: 2018-11-24 | Disposition: A | Discharge status: Home / Self Care | Payer: BC Managed Care – PPO | Attending: Urology | Admitting: Urology

## 2018-11-24 ENCOUNTER — Other Ambulatory Visit: Payer: Self-pay

## 2018-11-24 ENCOUNTER — Encounter (HOSPITAL_COMMUNITY)
Admission: RE | Disposition: A | Discharge status: Home / Self Care | Payer: Self-pay | Source: Home / Self Care | Attending: Urology

## 2018-11-24 ENCOUNTER — Encounter (HOSPITAL_COMMUNITY): Payer: Self-pay | Admitting: *Deleted

## 2018-11-24 ENCOUNTER — Ambulatory Visit (HOSPITAL_COMMUNITY): Payer: BC Managed Care – PPO | Admitting: Certified Registered Nurse Anesthetist

## 2018-11-24 DIAGNOSIS — Z20828 Contact with and (suspected) exposure to other viral communicable diseases: Secondary | ICD-10-CM | POA: Diagnosis not present

## 2018-11-24 DIAGNOSIS — Z793 Long term (current) use of hormonal contraceptives: Secondary | ICD-10-CM | POA: Diagnosis not present

## 2018-11-24 DIAGNOSIS — E039 Hypothyroidism, unspecified: Secondary | ICD-10-CM | POA: Insufficient documentation

## 2018-11-24 DIAGNOSIS — N202 Calculus of kidney with calculus of ureter: Secondary | ICD-10-CM | POA: Insufficient documentation

## 2018-11-24 DIAGNOSIS — I10 Essential (primary) hypertension: Secondary | ICD-10-CM | POA: Insufficient documentation

## 2018-11-24 DIAGNOSIS — Z79890 Hormone replacement therapy (postmenopausal): Secondary | ICD-10-CM | POA: Insufficient documentation

## 2018-11-24 DIAGNOSIS — N132 Hydronephrosis with renal and ureteral calculous obstruction: Secondary | ICD-10-CM | POA: Diagnosis not present

## 2018-11-24 DIAGNOSIS — Z87442 Personal history of urinary calculi: Secondary | ICD-10-CM | POA: Diagnosis not present

## 2018-11-24 DIAGNOSIS — Z79899 Other long term (current) drug therapy: Secondary | ICD-10-CM | POA: Insufficient documentation

## 2018-11-24 DIAGNOSIS — G5600 Carpal tunnel syndrome, unspecified upper limb: Secondary | ICD-10-CM | POA: Diagnosis not present

## 2018-11-24 DIAGNOSIS — N201 Calculus of ureter: Secondary | ICD-10-CM | POA: Diagnosis not present

## 2018-11-24 HISTORY — PX: CYSTOSCOPY WITH RETROGRADE PYELOGRAM, URETEROSCOPY AND STENT PLACEMENT: SHX5789

## 2018-11-24 HISTORY — PX: HOLMIUM LASER APPLICATION: SHX5852

## 2018-11-24 LAB — CBC
HCT: 48.7 % — ABNORMAL HIGH (ref 36.0–46.0)
Hemoglobin: 15.9 g/dL — ABNORMAL HIGH (ref 12.0–15.0)
MCH: 30.3 pg (ref 26.0–34.0)
MCHC: 32.6 g/dL (ref 30.0–36.0)
MCV: 92.9 fL (ref 80.0–100.0)
Platelets: 384 10*3/uL (ref 150–400)
RBC: 5.24 MIL/uL — ABNORMAL HIGH (ref 3.87–5.11)
RDW: 13.1 % (ref 11.5–15.5)
WBC: 11.6 10*3/uL — ABNORMAL HIGH (ref 4.0–10.5)
nRBC: 0 % (ref 0.0–0.2)

## 2018-11-24 LAB — PREGNANCY, URINE: Preg Test, Ur: NEGATIVE

## 2018-11-24 SURGERY — CYSTOURETEROSCOPY, WITH RETROGRADE PYELOGRAM AND STENT INSERTION
Anesthesia: General | Laterality: Bilateral

## 2018-11-24 MED ORDER — LACTATED RINGERS IV SOLN: INTRAVENOUS | Status: DC

## 2018-11-24 MED ORDER — PROPOFOL 10 MG/ML IV BOLUS
INTRAVENOUS | Status: DC | PRN
  Administered 2018-11-24: 200 mg via INTRAVENOUS

## 2018-11-24 MED ORDER — FENTANYL CITRATE (PF) 100 MCG/2ML IJ SOLN
INTRAMUSCULAR | Status: AC
  Filled 2018-11-24: qty 2

## 2018-11-24 MED ORDER — SODIUM CHLORIDE 0.9 % IR SOLN
Status: DC | PRN
  Administered 2018-11-24 (×2): 3000 mL

## 2018-11-24 MED ORDER — FENTANYL CITRATE (PF) 100 MCG/2ML IJ SOLN
INTRAMUSCULAR | Status: DC | PRN
  Administered 2018-11-24 (×4): 50 ug via INTRAVENOUS

## 2018-11-24 MED ORDER — SUCCINYLCHOLINE CHLORIDE 20 MG/ML IJ SOLN
INTRAMUSCULAR | Status: DC | PRN
  Administered 2018-11-24: 120 mg via INTRAVENOUS

## 2018-11-24 MED ORDER — MIDAZOLAM HCL 2 MG/2ML IJ SOLN
INTRAMUSCULAR | Status: AC
  Filled 2018-11-24: qty 2

## 2018-11-24 MED ORDER — DEXAMETHASONE SODIUM PHOSPHATE 10 MG/ML IJ SOLN
INTRAMUSCULAR | Status: AC
  Filled 2018-11-24: qty 1

## 2018-11-24 MED ORDER — FENTANYL CITRATE (PF) 100 MCG/2ML IJ SOLN: 25.0000 ug | INTRAMUSCULAR | Status: DC | PRN

## 2018-11-24 MED ORDER — PROPOFOL 10 MG/ML IV BOLUS
INTRAVENOUS | Status: AC
  Filled 2018-11-24: qty 20

## 2018-11-24 MED ORDER — ONDANSETRON HCL 4 MG/2ML IJ SOLN
INTRAMUSCULAR | Status: AC
  Filled 2018-11-24: qty 2

## 2018-11-24 MED ORDER — DEXAMETHASONE SODIUM PHOSPHATE 10 MG/ML IJ SOLN
INTRAMUSCULAR | Status: DC | PRN
  Administered 2018-11-24: 10 mg via INTRAVENOUS

## 2018-11-24 MED ORDER — LACTATED RINGERS IV SOLN
INTRAVENOUS | Status: DC
  Administered 2018-11-24 (×2): via INTRAVENOUS

## 2018-11-24 MED ORDER — OXYCODONE HCL 5 MG PO TABS
5.0000 mg | ORAL_TABLET | Freq: Once | ORAL | Status: AC
  Administered 2018-11-24: 5 mg via ORAL

## 2018-11-24 MED ORDER — MIDAZOLAM HCL 5 MG/5ML IJ SOLN
INTRAMUSCULAR | Status: DC | PRN
  Administered 2018-11-24: 2 mg via INTRAVENOUS

## 2018-11-24 MED ORDER — MEPERIDINE HCL 50 MG/ML IJ SOLN: 6.2500 mg | INTRAMUSCULAR | Status: DC | PRN

## 2018-11-24 MED ORDER — CIPROFLOXACIN HCL 500 MG PO TABS: 500.0000 mg | ORAL_TABLET | Freq: Two times a day (BID) | ORAL | 0 refills | Status: DC

## 2018-11-24 MED ORDER — ONDANSETRON HCL 4 MG/2ML IJ SOLN
INTRAMUSCULAR | Status: DC | PRN
  Administered 2018-11-24: 4 mg via INTRAVENOUS

## 2018-11-24 MED ORDER — OXYCODONE HCL 5 MG PO TABS
ORAL_TABLET | ORAL | Status: AC
  Filled 2018-11-24: qty 1

## 2018-11-24 MED ORDER — METOCLOPRAMIDE HCL 5 MG/ML IJ SOLN: 10.0000 mg | Freq: Once | INTRAMUSCULAR | Status: DC | PRN

## 2018-11-24 MED ORDER — LIDOCAINE HCL (CARDIAC) PF 100 MG/5ML IV SOSY
PREFILLED_SYRINGE | INTRAVENOUS | Status: DC | PRN
  Administered 2018-11-24: 50 mg via INTRAVENOUS

## 2018-11-24 MED ORDER — OXYCODONE-ACETAMINOPHEN 5-325 MG PO TABS: 1.0000 | ORAL_TABLET | Freq: Three times a day (TID) | ORAL | 0 refills | Status: DC | PRN

## 2018-11-24 MED ORDER — IOHEXOL 300 MG/ML  SOLN
INTRAMUSCULAR | Status: DC | PRN
  Administered 2018-11-24: 16:00:00 10 mL

## 2018-11-24 MED ORDER — GENTAMICIN SULFATE 40 MG/ML IJ SOLN
5.0000 mg/kg | INTRAVENOUS | Status: AC
  Administered 2018-11-24: 14:00:00 470 mg via INTRAVENOUS
  Filled 2018-11-24: qty 11.75

## 2018-11-24 MED ORDER — LIDOCAINE 2% (20 MG/ML) 5 ML SYRINGE
INTRAMUSCULAR | Status: AC
  Filled 2018-11-24: qty 5

## 2018-11-24 SURGICAL SUPPLY — 25 items
BAG URO CATCHER STRL LF (MISCELLANEOUS) ×2 IMPLANT
BASKET LASER NITINOL 1.9FR (BASKET) ×2 IMPLANT
BASKET STONE NCOMPASS (UROLOGICAL SUPPLIES) ×2 IMPLANT
CATH INTERMIT  6FR 70CM (CATHETERS) ×2 IMPLANT
CLOTH BEACON ORANGE TIMEOUT ST (SAFETY) ×2 IMPLANT
COVER SURGICAL LIGHT HANDLE (MISCELLANEOUS) ×2 IMPLANT
COVER WAND RF STERILE (DRAPES) IMPLANT
EXTRACTOR STONE 1.7FRX115CM (UROLOGICAL SUPPLIES) IMPLANT
FIBER LASER FLEXIVA 1000 (UROLOGICAL SUPPLIES) IMPLANT
FIBER LASER FLEXIVA 365 (UROLOGICAL SUPPLIES) IMPLANT
FIBER LASER FLEXIVA 550 (UROLOGICAL SUPPLIES) IMPLANT
FIBER LASER TRAC TIP (UROLOGICAL SUPPLIES) ×2 IMPLANT
GLOVE BIOGEL M STRL SZ7.5 (GLOVE) ×2 IMPLANT
GOWN STRL REUS W/TWL LRG LVL3 (GOWN DISPOSABLE) ×2 IMPLANT
GUIDEWIRE ANG ZIPWIRE 038X150 (WIRE) ×4 IMPLANT
GUIDEWIRE STR DUAL SENSOR (WIRE) ×4 IMPLANT
KIT TURNOVER KIT A (KITS) IMPLANT
MANIFOLD NEPTUNE II (INSTRUMENTS) ×2 IMPLANT
PACK CYSTO (CUSTOM PROCEDURE TRAY) ×2 IMPLANT
SHEATH URETERAL 12FRX28CM (UROLOGICAL SUPPLIES) ×2 IMPLANT
SHEATH URETERAL 12FRX35CM (MISCELLANEOUS) IMPLANT
STENT POLARIS 5FRX22 (STENTS) ×4 IMPLANT
TUBE FEEDING 8FR 16IN STR KANG (MISCELLANEOUS) ×2 IMPLANT
TUBING CONNECTING 10 (TUBING) ×2 IMPLANT
TUBING UROLOGY SET (TUBING) ×2 IMPLANT

## 2018-11-24 NOTE — Brief Op Note (Signed)
11/24/2018  3:57 PM  PATIENT:  Rebecca [name]  48 y.o. female  PRE-OPERATIVE DIAGNOSIS:  BILATERAL RENAL / URETERAL STONES  POST-OPERATIVE DIAGNOSIS:  BILATERAL RENAL / URETERAL STONES  PROCEDURE:  Procedure(s) with comments: CYSTOSCOPY WITH RETROGRADE PYELOGRAM, URETEROSCOPY AND STENT PLACEMENT (Bilateral) - 75 MINS HOLMIUM LASER APPLICATION (Bilateral)  SURGEON:  Surgeon(s) and Role:    Alexis Frock, MD - Primary  PHYSICIAN ASSISTANT:   ASSISTANTS: none   ANESTHESIA:   general  EBL:  minimal   BLOOD ADMINISTERED:none  DRAINS: none   LOCAL MEDICATIONS USED:  NONE  SPECIMEN:  Source of Specimen:  bilateral renal / ureteral stone fragments  DISPOSITION OF SPECIMEN:  [facility] for compositional analysis  COUNTS:  YES  TOURNIQUET:  * No tourniquets in log *  DICTATION: .Other Dictation: Dictation Number  [id number]  PLAN OF CARE: Discharge to home after PACU  PATIENT DISPOSITION:  PACU - hemodynamically stable.   Delay start of Pharmacological VTE agent (>24hrs) due to surgical blood loss or risk of bleeding: not applicable

## 2018-11-24 NOTE — Transfer of Care (Signed)
Immediate Anesthesia Transfer of Care Note  Patient: Rebecca [name]  Procedure(s) Performed: CYSTOSCOPY WITH RETROGRADE PYELOGRAM, URETEROSCOPY AND STENT PLACEMENT (Bilateral ) HOLMIUM LASER APPLICATION (Bilateral )  Patient Location: PACU  Anesthesia Type:General  Level of Consciousness: drowsy, patient cooperative and responds to stimulation  Airway & Oxygen Therapy: Patient Spontanous Breathing and Patient connected to face mask oxygen  Post-op Assessment: Report given to RN and Post -op Vital signs reviewed and stable  Post vital signs: Reviewed and stable  Last Vitals:  Vitals Value Taken Time  BP 136/99 11/24/18 1609  Temp    Pulse 87 11/24/18 1610  Resp 24 11/24/18 1610  SpO2 96 % 11/24/18 1610  Vitals shown include unvalidated device data.  Last Pain:  Vitals:   11/24/18 1318  TempSrc:   PainSc: 4       Patients Stated Pain Goal: 3 (89/21/19 4174)  Complications: No apparent anesthesia complications

## 2018-11-24 NOTE — Discharge Instructions (Signed)
1 - You may have urinary urgency (bladder spasms) and bloody urine on / off with stent in place. This is normal.  2 - Remove tethered stents on Monday morning at home bu pulling on strings, then blue-white plastic tubing and discarding. There are TWO stents.   3 - Call MD or go to ER for fever >102, severe pain / nausea / vomiting not relieved by medications, or acute change in medical status

## 2018-11-24 NOTE — Op Note (Signed)
NAME: Rebecca [name], Rebecca M. MEDICAL RECORD [id number] ACCOUNT [id number] DATE OF BIRTH:23-Aug-1970 FACILITY: [facility] LOCATION: [facility] PHYSICIAN:Clary Boulais Berneice HeinrichMANNY, MD  OPERATIVE REPORT  DATE OF PROCEDURE:  11/24/2018  PREOPERATIVE DIAGNOSIS:  Bilateral urolithiasis, recurrent history of pyelonephritis.  PROCEDURE: 1.  Cystoscopy, bilateral retrograde pyelograms, interpretation. 2.  Bilateral ureteroscopy with laser lithotripsy. 3.  Exchange of bilateral ureteral stents 5 x 22 Polaris with tether.  ESTIMATED BLOOD LOSS:  Nil.  COMPLICATIONS:  None.  SPECIMEN:  Bilateral urolithiasis for composition analysis.  FINDINGS: 1.  Multifocal right intrarenal stones, likely infectious component. 2.  Complete resolution of all accessible stone fragments larger than one-third mm right side following laser lithotripsy and basket extraction.   3.  Successful replacement of right ureteral stent proximal end in renal pelvis, distal end in urinary bladder. 4.  Small left ureteral stone multifocal left papillary tip calcifications. 5.  Complete resolution of all accessible stone fragments larger than one-third mm following laser lead extraction on the left side. 6.  Successful replacement of left ureteral stent proximal end in renal pelvis, distal end in urinary bladder.  INDICATIONS:  The patient is a pleasant but unfortunate 48 year old lady with history of morbid obesity and recurrent urolithiasis.  She has had innumerable prior stone procedures.  She has variable follow up when not having acute colic.  She was found  on workup of acute flank pain and fevers to have bilateral urolithiasis including ureteral stone.  She was temporized with bilateral stenting.  She has since cleared her infectious parameters and now presents for bilateral ureteroscopy with goal of stone  free today.  Informed consent was obtained and then placed in medical record.  DESCRIPTION OF PROCEDURE:  The patient was identified.   Procedure being bilateral ureteroscopic stimulation was confirmed.  Procedure timeout was performed.  Intravenous antibiotics administered.  General anesthesia induced.  The patient was placed into  a low lithotomy position.  The patient's prepubic fat pad was taped out of the operative field to allow access to the introitus which was then prepped using iodine.  Next, cystourethroscopy was performed using 21 French cystoscope with offset lens.   Inspection of bladder revealed distal end of bilateral stents in situ.  Distal end of the right stent was grasped, brought to the level of the urethral meatus.  A 0.038 ZIPwire was entered in the lower pole as a safety wire.  The distal end of the left  stent was grasped.  Also, brought through the urethral meatus was a separate 0.038 ZIPwire was then delivered pole and set aside as a safety wire.  An 8-French feeding tube was placed in the urinary bladder for pressure release, and semirigid  ureteroscopy was performed of the distal 4/5 of the left ureter alongside a separate working wire.  There were 2 small ureteral stones noted.  These were amenable to basketing.  They were removed and set aside for composition analysis.  Next semirigid  ureteroscopy was performed at the distal 4/5 of the right ureter alongside a separate working wire.  No mucosal abnormalities were found.  The semirigid scope was exchanged for a 12/14 short length ureteral access sheath to the level of the proximal  ureter using continuous fluoroscopic guidance and flexible digital ureteroscopy performed at the proximal ureter and systematic inspection of the right kidney, including all calices x3.  There were 2 dominant foci of calcifications, both of which appear  to have infectious component and much too large for simple basketing.  As such, Holmium laser  energy applied 70 setting of 0.2 joules and 20 Hz, and stones were fragmented into smaller pieces and/or dusted.   An NCompass basket was  used to grasp fragments set aside for composition analysis.  Following this, complete resolution of all accessible stone fragments larger than one-third mm.  Access sheath was removed under continusous vision, no mucosal abnormalities were  found.  Similarly, a sheath was placed over the left sensor working wire to the level of the proximal ureter and systematic inspection performed of left kidney with a flexible digital ureteroscope.  Multifocal papillary tip calcifications were noted,  and holmium laser energy applied to these using 0.2 joules and 20 Hz, and approximately 1 cm total volume of stone was able to be ablated and/or disimpacted from the papillary tips.  The fragments were basketed with the NCompass basket and set aside for  composition analysis.  FINDINGS:  There was complete resolution of all accessible stone fragments larger than one-third mm.  Access sheath was removed and the left side.  No significant mucosal abnormalities were found.  Given the bilateral nature of the procedure and her  history of recurrent infections, it was felt that brief interval stenting with tethered stents would be warranted.  As such, new 5 x 22 type stents were placed through remaining safety wires bilaterally using fluoroscopic guidance.  Good proximal and  distal plane were noted.  Tethers were left in place and fashioned to the inner thigh and the procedure terminated.  The patient tolerated the procedure well.  No immediate perioperative complications.  The patient was to the postanesthesia care unit in  stable condition with plan for discharge home.  TN/NUANCE  D:11/24/2018 T:11/24/2018 JOB:[id number]

## 2018-11-24 NOTE — H&P (Signed)
Rebecca [name] is an 48 y.o. female.    Chief Complaint: Pre-OP BILATERAL Ureteroscopic Stone Manipulation  HPI:   1 - Recurrent Urolithiasis -  Pre 2020 - innumerable stone events, URS, MET 11/2018 - Rt 30m prox stone with mild hydro (SSD 22cm, 850HU) with R>L scattered renal stones (about 1cm rt, 848mlt total vol)  2 - Pyelonephritis - fevers to 101, tachycardia UA nitrite + c/w early pyelo in setting of obstrutin stone. Placed on empiric rocephin and bilatearl stent placed 8/15 and quickly cleared infectious parameters. . Marland KitchenCX 8/15 proteus res Macrobid, but sens all others.   PMH sig for morbid obesity, hypothyroid. Her PCP is AaLondon PepperD with [facility] Today "PaCharlannis seen to proceed with BILATERAL ureteroscopic stone maniuplation. NO interval fevers. She has been on cefpodoxime.   Past Medical History:  Diagnosis Date  . History of kidney stones   . Hypertension   . Hypothyroidism   . Thyroid disease   . Ureteral stone with hydronephrosis 11/18/2018    Past Surgical History:  Procedure Laterality Date  . CARPAL TUNNEL RELEASE Right 07/24/2012   Procedure: CARPAL TUNNEL RELEASE;  Surgeon: StCarole CivilMD;  Location: [facility];  Service: Orthopedics;  Laterality: Right;  Right Carpal Tunnel Release  . CYSTOSCOPY     x6 for stones-with stenting  . CYSTOSCOPY W/ URETERAL STENT PLACEMENT Bilateral 02/10/2016   Procedure: CYSTOSCOPY WITH RETROGRADE PYELOGRAM/BILATERAL URETERAL STENT PLACEMENT;  Surgeon: ThAlexis FrockMD;  Location: [facility];  Service: Urology;  Laterality: Bilateral;  . CYSTOSCOPY W/ URETERAL STENT PLACEMENT Bilateral 11/18/2018   Procedure: CYSTOSCOPY WITH RETROGRADE PYELOGRAM/URETERAL STENT PLACEMENT;  Surgeon: MaAlexis FrockMD;  Location: [facility];  Service: Urology;  Laterality: Bilateral;  . CYSTOSCOPY/URETEROSCOPY/HOLMIUM LASER/STENT PLACEMENT Bilateral 03/10/2016   Procedure: CYSTOSCOPY/BILATERAL URETEROSCOPY/ BILATERAL HOLMIUM LASER /BILATERALSTENT  PLACEMENT/ BILATERAL RETOGRADE PYLEOGRAM;  Surgeon: ThAlexis FrockMD;  Location: [facility];  Service: Urology;  Laterality: Bilateral;  . DILATION AND CURETTAGE OF UTERUS    . HYSTEROSCOPY N/A 09/22/2018   Procedure: HYSTEROSCOPY W/ IUD REMOVAL AND REPLACEMENT;  Surgeon: OzJanyth PupaDO;  [facility]: [facility];  Laterality: N/A;  . INTRAUTERINE DEVICE (IUD) INSERTION N/A 09/22/2018   Procedure: Intrauterine Device (Iud) Insertion;  Surgeon: OzJanyth PupaDO;  [facility]: [facility];  Laterality: N/A;  . IUD REMOVAL N/A 09/22/2018   Procedure: Intrauterine Device (Iud) Removal;  Surgeon: OzJanyth PupaDO;  [facility];  Laterality: N/A;  . THYROIDECTOMY      History reviewed. No pertinent family history. Social History:  reports that she has never smoked. She has never used smokeless tobacco. She reports current alcohol use. She reports that she does not use drugs.  Allergies: No Known Allergies  Medications Prior to Admission  Medication Sig Dispense Refill  . amphetamine-dextroamphetamine (ADDERALL) 20 MG tablet Take 10-20 mg by mouth See admin instructions. 20 mg in the am and 10 mg in the evening    . cefpodoxime (VANTIN) 200 MG tablet Take 1 tablet (200 mg total) by mouth 2 (two) times daily. X 3 days now. Also start 2 days before next Urology surgery. (Patient taking differently: Take 200 mg by mouth 2 (two) times daily. start 2 days before next Urology surgery.) 10 tablet 0  . hydrochlorothiazide (MICROZIDE) 12.5 MG capsule Take 12.5 mg by mouth daily.    . Marland Kitchenevonorgestrel (MIRENA) 20 MCG/24HR IUD 1 each by Intrauterine route once.    . Marland Kitchenevothyroxine (  SYNTHROID, LEVOTHROID) 175 MCG tablet Take 175 mcg by mouth daily before breakfast.    . lisinopril (ZESTRIL) 40 MG tablet Take 40 mg by mouth daily.    Marland Kitchen oxyCODONE-acetaminophen (PERCOCET) 5-325 MG tablet Take 1 tablet by mouth every 6 (six) hours as needed for moderate pain or  severe pain. Post-operatively 15 tablet 0  . senna-docusate (SENOKOT-S) 8.6-50 MG tablet Take 1 tablet by mouth 2 (two) times daily. While taking strong pain meds to prevent constipation 20 tablet 0    Results for orders placed or performed during the hospital encounter of 11/24/18 (from the past 48 hour(s))  SARS CORONAVIRUS 2     Status: None   Collection Time: 11/23/18 12:33 PM  Result Value Ref Range   SARS Coronavirus 2 NEGATIVE NEGATIVE    Comment: (NOTE) SARS-CoV-2 target nucleic acids are NOT DETECTED. The SARS-CoV-2 RNA is generally detectable in upper and lower respiratory specimens during the acute phase of infection. Negative results do not preclude SARS-CoV-2 infection, do not rule out co-infections with other pathogens, and should not be used as the sole basis for treatment or other patient management decisions. Negative results must be combined with clinical observations, patient history, and epidemiological information. The expected result is Negative. Fact Sheet for Patients: SugarRoll.be Fact Sheet for Healthcare Providers: https://www.woods-mathews.com/ This test is not yet approved or cleared by the [place] FDA and  has been authorized for detection and/or diagnosis of SARS-CoV-2 by FDA under an Emergency Use Authorization (EUA). This EUA will remain  in effect (meaning this test can be used) for the duration of the COVID-19 declaration under Section 56 4(b)(1) of the Act, 21 U.S.C. section 360bbb-3(b)(1), unless the authorization is terminated or revoked sooner. Performed at [facility], [place] [address], [place], [place] [address]   Pregnancy, urine     Status: None   Collection Time: 11/24/18 12:32 PM  Result Value Ref Range   Preg Test, Ur NEGATIVE NEGATIVE    Comment:        THE SENSITIVITY OF THIS METHODOLOGY IS >20 mIU/mL. Performed at [facility], [place] [address]  [place], [place] [address]   CBC     Status: Abnormal   Collection Time: 11/24/18  1:00 PM  Result Value Ref Range   WBC 11.6 (H) 4.0 - 10.5 K/uL   RBC 5.24 (H) 3.87 - 5.11 MIL/uL   Hemoglobin 15.9 (H) 12.0 - 15.0 g/dL   HCT 48.7 (H) 36.0 - 46.0 %   MCV 92.9 80.0 - 100.0 fL   MCH 30.3 26.0 - 34.0 pg   MCHC 32.6 30.0 - 36.0 g/dL   RDW 13.1 11.5 - 15.5 %   Platelets 384 150 - 400 K/uL   nRBC 0.0 0.0 - 0.2 %    Comment: Performed at [facility], [place] [address], [place], [place] [address]   No results found.  Review of Systems  Constitutional: Negative for chills and fever.  Genitourinary: Positive for frequency and urgency.  All other systems reviewed and are negative.   Blood pressure 119/68, pulse (!) 114, temperature 98.5 F (36.9 C), temperature source Oral, resp. rate 16, height _0  (1.651 m), weight (!) 149.7 kg, SpO2 98 %. Physical Exam  Constitutional: She appears well-developed.  HENT:  Head: Normocephalic.  Eyes: Pupils are equal, round, and reactive to light.  Neck: Normal range of motion.  Respiratory: Effort normal.  GI: Soft.  Stable morbid truncal obesity.   Genitourinary:  Genitourinary Comments: Very mild CVAT.    Musculoskeletal: Normal range of motion.  Neurological: She is alert.  Skin: Skin is warm.  Psychiatric: She has a normal mood and affect.     Assessment/Plan  Proceed as planned with BILATERAL ureteroscopy to stone free. Risks, benefits, alternatives, expected peri-op course discussed in detail including need for temporary stents post-op given bilatearl surgery.   Alexis Frock, MD 11/24/2018, 2:02 PM

## 2018-11-24 NOTE — Anesthesia Preprocedure Evaluation (Addendum)
Anesthesia Evaluation  Patient identified by MRN, date of birth, ID band Patient awake    Reviewed: Allergy & Precautions, NPO status , Patient's Chart, lab work & pertinent test results  Airway Mallampati: II       Dental  (+) Teeth Intact   Pulmonary    Pulmonary exam normal        Cardiovascular hypertension, Pt. on medications Normal cardiovascular exam Rhythm:Regular Rate:Normal     Neuro/Psych    GI/Hepatic negative GI ROS, Neg liver ROS,   Endo/Other  Hypothyroidism Morbid obesity  Renal/GU negative Renal ROS     Musculoskeletal negative musculoskeletal ROS (+)   Abdominal (+) + obese,   Peds  Hematology   Anesthesia Other Findings   Reproductive/Obstetrics                             Anesthesia Physical  Anesthesia Plan  ASA: III  Anesthesia Plan: General   Post-op Pain Management:    Induction: Intravenous  PONV Risk Score and Plan: Ondansetron, Dexamethasone, Midazolam and Scopolamine patch - Pre-op  Airway Management Planned: Oral ETT  Additional Equipment:   Intra-op Plan:   Post-operative Plan: Extubation in OR  Informed Consent: I have reviewed the patients History and Physical, chart, labs and discussed the procedure including the risks, benefits and alternatives for the proposed anesthesia with the patient or authorized representative who has indicated his/her understanding and acceptance.     Dental advisory given  Plan Discussed with: CRNA  Anesthesia Plan Comments: ( )        Anesthesia Quick Evaluation

## 2018-11-24 NOTE — Anesthesia Procedure Notes (Signed)
Procedure Name: Intubation Date/Time: 11/24/2018 2:28 PM Performed by: Glory Buff, CRNA Pre-anesthesia Checklist: Patient identified, Emergency Drugs available, Suction available and Patient being monitored Patient Re-evaluated:Patient Re-evaluated prior to induction Oxygen Delivery Method: Circle system utilized Preoxygenation: Pre-oxygenation with 100% oxygen Induction Type: IV induction Ventilation: Mask ventilation without difficulty Laryngoscope Size: Miller and 3 Grade View: Grade I Tube type: Oral Tube size: 7.0 mm Number of attempts: 1 Airway Equipment and Method: Stylet and Oral airway Placement Confirmation: ETT inserted through vocal cords under direct vision,  positive ETCO2 and breath sounds checked- equal and bilateral Secured at: 20 cm Tube secured with: Tape Dental Injury: Teeth and Oropharynx as per pre-operative assessment

## 2018-11-26 ENCOUNTER — Encounter (HOSPITAL_COMMUNITY): Payer: Self-pay | Admitting: Urology

## 2018-11-27 DIAGNOSIS — N202 Calculus of kidney with calculus of ureter: Secondary | ICD-10-CM | POA: Diagnosis not present

## 2018-11-27 NOTE — Anesthesia Postprocedure Evaluation (Signed)
Anesthesia Post Note  Patient: Rebecca [name]  Procedure(s) Performed: CYSTOSCOPY WITH RETROGRADE PYELOGRAM, URETEROSCOPY AND STENT PLACEMENT (Bilateral ) HOLMIUM LASER APPLICATION (Bilateral )     Patient location during evaluation: PACU Anesthesia Type: General Level of consciousness: awake and alert Pain management: pain level controlled Vital Signs Assessment: post-procedure vital signs reviewed and stable Respiratory status: spontaneous breathing, nonlabored ventilation, respiratory function stable and patient connected to nasal cannula oxygen Cardiovascular status: blood pressure returned to baseline and stable Postop Assessment: no apparent nausea or vomiting Anesthetic complications: no    Last Vitals:  Vitals:   11/24/18 1625 11/24/18 1709  BP: (!) 141/95 (!) 143/92  Pulse: 81 86  Resp: (!) 25 18  Temp: 36.8 C 37.1 C  SpO2: 93% 98%    Last Pain:  Vitals:   11/24/18 1709  TempSrc: Oral  PainSc: 6                  Montez Hageman

## 2018-12-12 DIAGNOSIS — N202 Calculus of kidney with calculus of ureter: Secondary | ICD-10-CM | POA: Diagnosis not present

## 2018-12-12 DIAGNOSIS — N3020 Other chronic cystitis without hematuria: Secondary | ICD-10-CM | POA: Diagnosis not present

## 2018-12-15 DIAGNOSIS — I10 Essential (primary) hypertension: Secondary | ICD-10-CM | POA: Diagnosis not present

## 2018-12-15 DIAGNOSIS — E039 Hypothyroidism, unspecified: Secondary | ICD-10-CM | POA: Diagnosis not present

## 2018-12-15 DIAGNOSIS — F900 Attention-deficit hyperactivity disorder, predominantly inattentive type: Secondary | ICD-10-CM | POA: Diagnosis not present

## 2019-03-26 DIAGNOSIS — Z6841 Body Mass Index (BMI) 40.0 and over, adult: Secondary | ICD-10-CM | POA: Diagnosis not present

## 2019-03-26 DIAGNOSIS — G5602 Carpal tunnel syndrome, left upper limb: Secondary | ICD-10-CM | POA: Diagnosis not present

## 2019-03-26 DIAGNOSIS — M79671 Pain in right foot: Secondary | ICD-10-CM | POA: Diagnosis not present

## 2019-03-26 DIAGNOSIS — M5441 Lumbago with sciatica, right side: Secondary | ICD-10-CM | POA: Diagnosis not present

## 2019-03-26 DIAGNOSIS — M545 Low back pain: Secondary | ICD-10-CM | POA: Diagnosis not present

## 2019-03-27 DIAGNOSIS — R1900 Intra-abdominal and pelvic swelling, mass and lump, unspecified site: Secondary | ICD-10-CM | POA: Diagnosis not present

## 2019-04-05 DIAGNOSIS — K760 Fatty (change of) liver, not elsewhere classified: Secondary | ICD-10-CM | POA: Diagnosis not present

## 2019-04-05 DIAGNOSIS — K5730 Diverticulosis of large intestine without perforation or abscess without bleeding: Secondary | ICD-10-CM | POA: Diagnosis not present

## 2019-04-05 DIAGNOSIS — N200 Calculus of kidney: Secondary | ICD-10-CM | POA: Diagnosis not present

## 2019-04-05 DIAGNOSIS — N29 Other disorders of kidney and ureter in diseases classified elsewhere: Secondary | ICD-10-CM | POA: Diagnosis not present

## 2019-04-11 DIAGNOSIS — G5602 Carpal tunnel syndrome, left upper limb: Secondary | ICD-10-CM | POA: Diagnosis not present

## 2019-04-13 DIAGNOSIS — Z20828 Contact with and (suspected) exposure to other viral communicable diseases: Secondary | ICD-10-CM | POA: Diagnosis not present

## 2019-04-17 ENCOUNTER — Ambulatory Visit: Payer: BC Managed Care – PPO | Attending: Internal Medicine

## 2019-04-17 DIAGNOSIS — Z20822 Contact with and (suspected) exposure to covid-19: Secondary | ICD-10-CM | POA: Diagnosis not present

## 2019-04-18 DIAGNOSIS — L304 Erythema intertrigo: Secondary | ICD-10-CM | POA: Diagnosis not present

## 2019-04-19 ENCOUNTER — Telehealth: Payer: Self-pay | Admitting: Family Medicine

## 2019-04-19 DIAGNOSIS — G5602 Carpal tunnel syndrome, left upper limb: Secondary | ICD-10-CM | POA: Diagnosis not present

## 2019-04-19 DIAGNOSIS — Z9889 Other specified postprocedural states: Secondary | ICD-10-CM | POA: Diagnosis not present

## 2019-04-19 LAB — NOVEL CORONAVIRUS, NAA: SARS-CoV-2, NAA: DETECTED — AB

## 2019-04-19 NOTE — Telephone Encounter (Signed)
Patient called about Positive Covid test from date testing event. The patient has mild upper respiratory symptoms and no fever. Symptoms are mild at this time. Patient understands the needs to stay in isolation for a total of 10 days from onset of symptom or 14 days total from date of testing if no symptom. Patient knows the health department may be in touch.  I answered all of patient's questions and all concerns addressed.   Jaiona Simien Moore Ehren Berisha  APRN, MSN, FNP-C [facility] [address]  [place], [place] [address] [phone]   

## 2019-04-23 DIAGNOSIS — Z0000 Encounter for general adult medical examination without abnormal findings: Secondary | ICD-10-CM | POA: Diagnosis not present

## 2019-04-23 DIAGNOSIS — F900 Attention-deficit hyperactivity disorder, predominantly inattentive type: Secondary | ICD-10-CM | POA: Diagnosis not present

## 2019-04-23 DIAGNOSIS — Z20828 Contact with and (suspected) exposure to other viral communicable diseases: Secondary | ICD-10-CM | POA: Diagnosis not present

## 2019-04-23 DIAGNOSIS — I10 Essential (primary) hypertension: Secondary | ICD-10-CM | POA: Diagnosis not present

## 2019-04-23 DIAGNOSIS — E039 Hypothyroidism, unspecified: Secondary | ICD-10-CM | POA: Diagnosis not present

## 2019-05-08 DIAGNOSIS — R0602 Shortness of breath: Secondary | ICD-10-CM | POA: Diagnosis not present

## 2019-05-08 DIAGNOSIS — Z8616 Personal history of COVID-19: Secondary | ICD-10-CM | POA: Diagnosis not present

## 2019-05-14 ENCOUNTER — Other Ambulatory Visit: Payer: Self-pay | Admitting: Family Medicine

## 2019-05-14 ENCOUNTER — Ambulatory Visit
Admission: RE | Admit: 2019-05-14 | Discharge: 2019-05-14 | Disposition: A | Discharge status: Home / Self Care | Payer: BC Managed Care – PPO | Source: Ambulatory Visit | Attending: Family Medicine | Admitting: Family Medicine

## 2019-05-14 DIAGNOSIS — R0600 Dyspnea, unspecified: Secondary | ICD-10-CM

## 2019-05-14 DIAGNOSIS — R05 Cough: Secondary | ICD-10-CM | POA: Diagnosis not present

## 2019-05-14 DIAGNOSIS — R0602 Shortness of breath: Secondary | ICD-10-CM | POA: Diagnosis not present

## 2019-05-16 ENCOUNTER — Other Ambulatory Visit: Payer: Self-pay

## 2019-05-16 ENCOUNTER — Emergency Department (HOSPITAL_BASED_OUTPATIENT_CLINIC_OR_DEPARTMENT_OTHER)
Admission: EM | Admit: 2019-05-16 | Discharge: 2019-05-16 | Disposition: A | Discharge status: Home / Self Care | Payer: BC Managed Care – PPO | Attending: Emergency Medicine | Admitting: Emergency Medicine

## 2019-05-16 ENCOUNTER — Emergency Department (HOSPITAL_BASED_OUTPATIENT_CLINIC_OR_DEPARTMENT_OTHER): Payer: BC Managed Care – PPO

## 2019-05-16 ENCOUNTER — Encounter (HOSPITAL_BASED_OUTPATIENT_CLINIC_OR_DEPARTMENT_OTHER): Payer: Self-pay | Admitting: *Deleted

## 2019-05-16 DIAGNOSIS — U071 COVID-19: Secondary | ICD-10-CM | POA: Diagnosis not present

## 2019-05-16 DIAGNOSIS — I10 Essential (primary) hypertension: Secondary | ICD-10-CM | POA: Insufficient documentation

## 2019-05-16 DIAGNOSIS — R0602 Shortness of breath: Secondary | ICD-10-CM | POA: Diagnosis not present

## 2019-05-16 DIAGNOSIS — E876 Hypokalemia: Secondary | ICD-10-CM | POA: Diagnosis not present

## 2019-05-16 DIAGNOSIS — R079 Chest pain, unspecified: Secondary | ICD-10-CM | POA: Insufficient documentation

## 2019-05-16 DIAGNOSIS — R519 Headache, unspecified: Secondary | ICD-10-CM | POA: Insufficient documentation

## 2019-05-16 DIAGNOSIS — R0789 Other chest pain: Secondary | ICD-10-CM | POA: Diagnosis not present

## 2019-05-16 DIAGNOSIS — Z79899 Other long term (current) drug therapy: Secondary | ICD-10-CM | POA: Insufficient documentation

## 2019-05-16 DIAGNOSIS — E038 Other specified hypothyroidism: Secondary | ICD-10-CM | POA: Diagnosis not present

## 2019-05-16 DIAGNOSIS — R7989 Other specified abnormal findings of blood chemistry: Secondary | ICD-10-CM | POA: Diagnosis not present

## 2019-05-16 HISTORY — DX: COVID-19: U07.1

## 2019-05-16 LAB — CBC WITH DIFFERENTIAL/PLATELET
Abs Immature Granulocytes: 0.06 10*3/uL (ref 0.00–0.07)
Basophils Absolute: 0.1 10*3/uL (ref 0.0–0.1)
Basophils Relative: 1 %
Eosinophils Absolute: 0.2 10*3/uL (ref 0.0–0.5)
Eosinophils Relative: 2 %
HCT: 45 % (ref 36.0–46.0)
Hemoglobin: 15.2 g/dL — ABNORMAL HIGH (ref 12.0–15.0)
Immature Granulocytes: 1 %
Lymphocytes Relative: 36 %
Lymphs Abs: 4.2 10*3/uL — ABNORMAL HIGH (ref 0.7–4.0)
MCH: 30.7 pg (ref 26.0–34.0)
MCHC: 33.8 g/dL (ref 30.0–36.0)
MCV: 90.9 fL (ref 80.0–100.0)
Monocytes Absolute: 1.3 10*3/uL — ABNORMAL HIGH (ref 0.1–1.0)
Monocytes Relative: 12 %
Neutro Abs: 5.7 10*3/uL (ref 1.7–7.7)
Neutrophils Relative %: 48 %
Platelets: 307 10*3/uL (ref 150–400)
RBC: 4.95 MIL/uL (ref 3.87–5.11)
RDW: 14.3 % (ref 11.5–15.5)
WBC: 11.5 10*3/uL — ABNORMAL HIGH (ref 4.0–10.5)
nRBC: 0 % (ref 0.0–0.2)

## 2019-05-16 LAB — D-DIMER, QUANTITATIVE: D-Dimer, Quant: 0.66 ug/mL-FEU — ABNORMAL HIGH (ref 0.00–0.50)

## 2019-05-16 LAB — URINALYSIS, ROUTINE W REFLEX MICROSCOPIC
Bilirubin Urine: NEGATIVE
Glucose, UA: NEGATIVE mg/dL
Ketones, ur: NEGATIVE mg/dL
Leukocytes,Ua: NEGATIVE
Nitrite: NEGATIVE
Protein, ur: NEGATIVE mg/dL
Specific Gravity, Urine: 1.025 (ref 1.005–1.030)
pH: 5.5 (ref 5.0–8.0)

## 2019-05-16 LAB — COMPREHENSIVE METABOLIC PANEL
ALT: 43 U/L (ref 0–44)
AST: 31 U/L (ref 15–41)
Albumin: 3.8 g/dL (ref 3.5–5.0)
Alkaline Phosphatase: 49 U/L (ref 38–126)
Anion gap: 9 (ref 5–15)
BUN: 21 mg/dL — ABNORMAL HIGH (ref 6–20)
CO2: 24 mmol/L (ref 22–32)
Calcium: 9.4 mg/dL (ref 8.9–10.3)
Chloride: 106 mmol/L (ref 98–111)
Creatinine, Ser: 1 mg/dL (ref 0.44–1.00)
GFR calc Af Amer: >60 mL/min (ref >60)
GFR calc non Af Amer: >60 mL/min (ref >60)
Glucose, Bld: 97 mg/dL (ref 70–99)
Potassium: 3.3 mmol/L — ABNORMAL LOW (ref 3.5–5.1)
Sodium: 139 mmol/L (ref 135–145)
Total Bilirubin: 1.1 mg/dL (ref 0.3–1.2)
Total Protein: 7.2 g/dL (ref 6.5–8.1)

## 2019-05-16 LAB — URINALYSIS, MICROSCOPIC (REFLEX)

## 2019-05-16 LAB — PREGNANCY, URINE: Preg Test, Ur: NEGATIVE

## 2019-05-16 LAB — TROPONIN I (HIGH SENSITIVITY)
Troponin I (High Sensitivity): 3 ng/L (ref <18)
Troponin I (High Sensitivity): 3 ng/L (ref <18)

## 2019-05-16 MED ORDER — POTASSIUM CHLORIDE ER 10 MEQ PO TBCR: 10.0000 meq | EXTENDED_RELEASE_TABLET | Freq: Every day | ORAL | 0 refills | Status: DC

## 2019-05-16 MED ORDER — POTASSIUM CHLORIDE CRYS ER 20 MEQ PO TBCR
40.0000 meq | EXTENDED_RELEASE_TABLET | Freq: Once | ORAL | Status: AC
  Administered 2019-05-16: 40 meq via ORAL
  Filled 2019-05-16: qty 2

## 2019-05-16 MED ORDER — IOHEXOL 350 MG/ML SOLN
100.0000 mL | Freq: Once | INTRAVENOUS | Status: AC | PRN
  Administered 2019-05-16: 100 mL via INTRAVENOUS

## 2019-05-16 NOTE — ED Triage Notes (Signed)
Pt c/o left sided chest pain x 1 day with sob , COIvd dx jan 7

## 2019-05-16 NOTE — Discharge Instructions (Signed)
Please follow up with your PCP as well as cardiology given your ED visit today. I have placed an ambulatory referral to cardiology - they will call you to schedule an appointment.   Take Ibuprofen and Tylenol as needed for pain.   Your potassium was mildly low today. We have given you potassium in the ED and discharged home with a short course. You will need to have your potassium level rechecked in 1-2 weeks.   Return to the ED for any worsening symptoms including worsening pain, worsening SOB, passing out, ripping/tearing back pain, headache, vision changes, weakness or numbness on one side of your body.

## 2019-05-16 NOTE — ED Notes (Addendum)
Ambulated patient in the saturation between 93-98% in RA and HR between 109 to 125.  Denies shortness of breath.

## 2019-05-16 NOTE — ED Provider Notes (Signed)
[facility] EMERGENCY DEPARTMENT Provider Note   CSN: [id number] Arrival date & time: 05/16/19  1500     History Chief Complaint  Patient presents with  . Chest Pain    Rebecca [name] is a 49 y.o. female with PMHx HTN, hypothyroidism s/p thyroidectomy who presents to the ED today complaining of gradual onset, constant, tight, left sided chest pain radiating into left arm x 1 day. Pt reports the pain is worsened with deep inspiration. She also complains of SOB. She does report she was diagnosed with COVID 19 early January and has been having residual shortness of breath since then however the chest pain concerned her. Pt also reports she had a sudden onset tightness in her bilateral temples yesterday that lasted a couple of seconds before dissipating. No vision changes, speech difficulties, unilateral weakness or numbness, vomiting, or any other associated symptoms. Pt called her PCP today regarding her symptoms and was told to come to the ED for further evaluation. No hx DVT/PE. No recent prolonged travel or immobilization. No hemoptysis. No active malignancy. No exogenous hormone use. Pt denies FHx of MI. Pt is a nonsmoker.   The history is provided by the patient and medical records.    HPI: A 49 year old patient with a history of hypertension and obesity presents for evaluation of chest pain. Initial onset of pain was approximately 1-3 hours ago. The patient's chest pain is well-localized, is described as heaviness/pressure/tightness and is not worse with exertion. The patient's chest pain is middle- or left-sided, is not sharp and does radiate to the arms/jaw/neck. The patient does not complain of nausea and denies diaphoresis. The patient has no history of stroke, has no history of peripheral artery disease, has not smoked in the past 90 days, denies any history of treated diabetes, has no relevant family history of coronary artery disease (first degree relative at less than age 71)  and has no history of hypercholesterolemia.   Past Medical History:  Diagnosis Date  . COVID-19   . History of kidney stones   . Hypertension   . Hypothyroidism   . Thyroid disease   . Ureteral stone with hydronephrosis 11/18/2018    Patient Active Problem List   Diagnosis Date Noted  . Ureteral stone with hydronephrosis 11/18/2018  . Right ureteral stone 02/10/2016  . S/P carpal tunnel release 08/07/2012  . CTS (carpal tunnel syndrome) 06/27/2012  . ANKLE SPRAIN 02/24/2010  . PAIN IN JOINT, ANKLE AND FOOT 01/14/2010    Past Surgical History:  Procedure Laterality Date  . CARPAL TUNNEL RELEASE Right 07/24/2012   Procedure: CARPAL TUNNEL RELEASE;  Surgeon: Vickki Hearing, MD;  Location: [facility];  Service: Orthopedics;  Laterality: Right;  Right Carpal Tunnel Release  . CYSTOSCOPY     x6 for stones-with stenting  . CYSTOSCOPY W/ URETERAL STENT PLACEMENT Bilateral 02/10/2016   Procedure: CYSTOSCOPY WITH RETROGRADE PYELOGRAM/BILATERAL URETERAL STENT PLACEMENT;  Surgeon: Sebastian Ache, MD;  Location: [facility];  Service: Urology;  Laterality: Bilateral;  . CYSTOSCOPY W/ URETERAL STENT PLACEMENT Bilateral 11/18/2018   Procedure: CYSTOSCOPY WITH RETROGRADE PYELOGRAM/URETERAL STENT PLACEMENT;  Surgeon: Sebastian Ache, MD;  Location: [facility];  Service: Urology;  Laterality: Bilateral;  . CYSTOSCOPY WITH RETROGRADE PYELOGRAM, URETEROSCOPY AND STENT PLACEMENT Bilateral 11/24/2018   Procedure: CYSTOSCOPY WITH RETROGRADE PYELOGRAM, URETEROSCOPY AND STENT PLACEMENT;  Surgeon: Sebastian Ache, MD;  Location: [facility];  Service: Urology;  Laterality: Bilateral;  75 MINS  . CYSTOSCOPY/URETEROSCOPY/HOLMIUM LASER/STENT PLACEMENT Bilateral 03/10/2016   Procedure: CYSTOSCOPY/BILATERAL URETEROSCOPY/  BILATERAL HOLMIUM LASER /BILATERALSTENT PLACEMENT/ BILATERAL RETOGRADE PYLEOGRAM;  Surgeon: Alexis Frock, MD;  Location: [facility];  Service: Urology;  Laterality: Bilateral;  . DILATION AND CURETTAGE OF UTERUS      . HOLMIUM LASER APPLICATION Bilateral 07/12/8117   Procedure: HOLMIUM LASER APPLICATION;  Surgeon: Alexis Frock, MD;  Location: [facility];  Service: Urology;  Laterality: Bilateral;  . HYSTEROSCOPY N/A 09/22/2018   Procedure: HYSTEROSCOPY W/ IUD REMOVAL AND REPLACEMENT;  Surgeon: Janyth Pupa, DO;  Location: [facility];  Service: Gynecology;  Laterality: N/A;  . INTRAUTERINE DEVICE (IUD) INSERTION N/A 09/22/2018   Procedure: Intrauterine Device (Iud) Insertion;  Surgeon: Janyth Pupa, DO;  Location: [place];  Service: Gynecology;  Laterality: N/A;  . IUD REMOVAL N/A 09/22/2018   Procedure: Intrauterine Device (Iud) Removal;  Surgeon: Janyth Pupa, DO;  Location: [place];  Service: Gynecology;  Laterality: N/A;  . THYROIDECTOMY       OB History   No obstetric history on file.     History reviewed. No pertinent family history.  Social History   Tobacco Use  . Smoking status: Never Smoker  . Smokeless tobacco: Never Used  Substance Use Topics  . Alcohol use: Yes    Comment: occ  . Drug use: No    Home Medications Prior to Admission medications   Medication Sig Start Date End Date Taking? Authorizing Provider  amphetamine-dextroamphetamine (ADDERALL) 20 MG tablet Take 10-20 mg by mouth See admin instructions. 20 mg in the am and 10 mg in the evening    [provider]  benzonatate (TESSALON) 200 MG capsule Take 200 mg by mouth 3 (three) times daily as needed. 05/08/19   [provider]  clotrimazole-betamethasone (LOTRISONE) cream APPLY A SMALL AMOUNT TO THE AFFECTED AREA TWICE DAILY AS NEEDED 04/18/19   [provider]  hydrochlorothiazide (MICROZIDE) 12.5 MG capsule Take 12.5 mg by mouth daily.    [provider]  levonorgestrel (MIRENA) 20 MCG/24HR IUD 1 each by Intrauterine route once.    [provider]  levothyroxine (SYNTHROID, LEVOTHROID) 175 MCG tablet Take 175 mcg by mouth daily before breakfast.    [provider]  lisinopril  (ZESTRIL) 40 MG tablet Take 40 mg by mouth daily.    [provider]  potassium chloride (KLOR-CON) 10 MEQ tablet Take 1 tablet (10 mEq total) by mouth daily for 5 days. 05/16/19 05/21/19  Eustaquio Maize, PA-C    Allergies    Patient has no known allergies.  Review of Systems   Review of Systems  Constitutional: Negative for chills and fever.  HENT: Negative for congestion.   Eyes: Negative for visual disturbance.  Respiratory: Positive for cough and shortness of breath.   Cardiovascular: Positive for chest pain.  Gastrointestinal: Negative for abdominal pain, diarrhea, nausea and vomiting.  Musculoskeletal: Negative for myalgias and neck pain.  Skin: Negative for rash.  Neurological: Positive for headaches (resolved). Negative for dizziness, seizures, syncope, speech difficulty, weakness, light-headedness and numbness.  All other systems reviewed and are negative.   Physical Exam Updated Vital Signs BP (!) 137/91 (BP Location: Left Arm)   Pulse 98   Temp 98.1 F (36.7 C) (Oral)   Resp (!) 24   Ht 5\' 5"  (1.651 m)   Wt (!) 150.6 kg   SpO2 98%   BMI 55.25 kg/m   Physical Exam Vitals and nursing note reviewed.  Constitutional:      Appearance: She is obese. She is not ill-appearing or diaphoretic.  HENT:  Head: Normocephalic and atraumatic.  Eyes:     Conjunctiva/sclera: Conjunctivae normal.  Cardiovascular:     Rate and Rhythm: Normal rate and regular rhythm.     Pulses:          Radial pulses are 2+ on the right side and 2+ on the left side.       Dorsalis pedis pulses are 2+ on the right side and 2+ on the left side.     Heart sounds: Normal heart sounds.  Pulmonary:     Effort: Pulmonary effort is normal.     Breath sounds: Normal breath sounds. No decreased breath sounds, wheezing, rhonchi or rales.     Comments: Satting 98% on RA. No accessory muscle use. Able to speak in full sentences without difficulty.  Chest:     Chest wall: Tenderness  (substernal TTP) present.  Abdominal:     Palpations: Abdomen is soft.     Tenderness: There is no abdominal tenderness. There is no guarding or rebound.  Musculoskeletal:     Cervical back: Neck supple.  Skin:    General: Skin is warm and dry.  Neurological:     Mental Status: She is alert.     Comments: CN 3-12 grossly intact A&O x4 GCS 15 Sensation and strength intact Gait nonataxic including with tandem walking Coordination with finger-to-nose WNL Neg romberg, neg pronator drift     ED Results / Procedures / Treatments   Labs (all labs ordered are listed, but only abnormal results are displayed) Labs Reviewed  CBC WITH DIFFERENTIAL/PLATELET - Abnormal; Notable for the following components:      Result Value   WBC 11.5 (*)    Hemoglobin 15.2 (*)    Lymphs Abs 4.2 (*)    Monocytes Absolute 1.3 (*)    All other components within normal limits  COMPREHENSIVE METABOLIC PANEL - Abnormal; Notable for the following components:   Potassium 3.3 (*)    BUN 21 (*)    All other components within normal limits  D-DIMER, QUANTITATIVE (NOT AT [facility]) - Abnormal; Notable for the following components:   D-Dimer, Quant 0.66 (*)    All other components within normal limits  URINALYSIS, ROUTINE W REFLEX MICROSCOPIC - Abnormal; Notable for the following components:   Hgb urine dipstick TRACE (*)    All other components within normal limits  URINALYSIS, MICROSCOPIC (REFLEX) - Abnormal; Notable for the following components:   Bacteria, UA FEW (*)    All other components within normal limits  PREGNANCY, URINE  TROPONIN I (HIGH SENSITIVITY)  TROPONIN I (HIGH SENSITIVITY)    EKG EKG Interpretation  Date/Time:  Wednesday May 16 2019 15:09:20 EST Ventricular Rate:  102 PR Interval:    QRS Duration: 88 QT Interval:  354 QTC Calculation: 462 R Axis:   35 Text Interpretation: Sinus tachycardia When compared to prior, no significant changes seen. No STEMI Confirmed by Theda Belfast  [phone]) on 05/16/2019 3:28:13 PM   Radiology CT Head Wo Contrast  Result Date: 05/16/2019 CLINICAL DATA:  Acute headache. Normal neuro exam. EXAM: CT HEAD WITHOUT CONTRAST TECHNIQUE: Contiguous axial images were obtained from the base of the skull through the vertex without intravenous contrast. COMPARISON:  None. FINDINGS: Brain: No intracranial hemorrhage, mass effect, or midline shift. No hydrocephalus. The basilar cisterns are patent. No evidence of territorial infarct or acute ischemia. No extra-axial or intracranial fluid collection. Vascular: No hyperdense vessel or unexpected calcification. Skull: No fracture or focal lesion. Sinuses/Orbits: Paranasal sinuses and mastoid air  cells are clear. The visualized orbits are unremarkable. Other: None. IMPRESSION: Negative noncontrast head CT. Electronically Signed   By: Narda Rutherford M.D.   On: 05/16/2019 17:38   CT Angio Chest PE W/Cm &/Or Wo Cm  Result Date: 05/16/2019 CLINICAL DATA:  Positive D-dimer. Left-sided chest pain over the last day. Coronavirus infection. EXAM: CT ANGIOGRAPHY CHEST WITH CONTRAST TECHNIQUE: Multidetector CT imaging of the chest was performed using the standard protocol during bolus administration of intravenous contrast. Multiplanar CT image reconstructions and MIPs were obtained to evaluate the vascular anatomy. CONTRAST:  OMNIPAQUE IOHEXOL 350 MG/ML SOLN COMPARISON:  Chest radiography same day FINDINGS: Cardiovascular: Bolus timing is good, but detail is somewhat limited due to patient size. I do not see any pulmonary emboli. I think there is confident exclusion of large or central emboli. I do not see evidence of any small or peripheral emboli. Heart size is normal. Aorta is normal. No visible coronary artery calcification. Mediastinum/Nodes: No mass or lymphadenopathy. Lungs/Pleura: Scattered areas of very minimal hazy pulmonary infiltrates in both lungs consistent with the clinical history of viral pneumonia. No  dense consolidation, collapse or effusion. Upper Abdomen: Negative Musculoskeletal: Negative Review of the MIP images confirms the above findings. IMPRESSION: No pulmonary emboli identified. Bilateral mild scattered hazy infiltrates consistent with viral pneumonia. No advanced consolidation, collapse or effusion. Electronically Signed   By: Paulina Fusi M.D.   On: 05/16/2019 17:38   DG Chest Portable 1 View  Result Date: 05/16/2019 CLINICAL DATA:  Left-sided chest pain for 1 day, COVID-19 positive 04/12/2019 EXAM: PORTABLE CHEST 1 VIEW COMPARISON:  05/14/2019 FINDINGS: Single frontal view of the chest was performed, limited by portable technique and body habitus. Cardiac silhouette is unremarkable. No airspace disease, effusion, or pneumothorax. No acute bony abnormalities. IMPRESSION: No acute cardiopulmonary disease. Electronically Signed   By: Sharlet Salina M.D.   On: 05/16/2019 15:33    Procedures Procedures (including critical care time)  Medications Ordered in ED Medications  potassium chloride SA (KLOR-CON) CR tablet 40 mEq (has no administration in time range)  iohexol (OMNIPAQUE) 350 MG/ML injection 100 mL (100 mLs Intravenous Contrast Given 05/16/19 1710)    ED Course  I have reviewed the triage vital signs and the nursing notes.  Pertinent labs & imaging results that were available during my care of the patient were reviewed by me and considered in my medical decision making (see chart for details).  49 year old female who presents to the ED today complaining of chest pain going down her left arm as well as shortness of breath however states the shortness of breath has been residual since COVID-19 diagnosis beginning of January.  Pain is pleuritic in nature however she is TTP, question musculoskeletal type pain vs costochondritis as pt reports she has been having a residual cough as well.  In recent COVID-19 diagnosis there is concern for possible blood clot.  No other risk factors.   Will obtain D-dimer and if elevated will obtain CTA.   Does endorse she had a sudden onset headache that lasted only a couple of seconds yesterday before dissipating.  No other symptoms today no neck pain, no vision changes, no speech changes, no unilateral weakness or numbness.  She has no focal neuro deficits on exam today.  Will obtain CT head for further evaluation.  Eyes in no acute distress.  She is afebrile, nontachypneic.  Oddly tachycardic in the low 100s which is why we will obtain D-dimer today with concern for PE.  CBC with elevated WBC count at 11,000. Hgb stable compared to baseline.  CMP with potassium 3.3; will replete. Creatinine improved compared to baseline.  Initial trop of 3. Will repeat.  D dimer elevated at 0.66, CTA ordered.  U/A without infection  CXR clear.   CTA without PE. Does show viral PNA changes consistent with recent covid 19 diagnosis.  CT Head clear.   Pt was ambulated in the ED and O2 sats remained above 93%. Pt's heart rate did elevate in the 120's with ambulation however.   Repeat trop 3. Heart score of 3.   Feel pt is stable for discharge at this time. Will have her follow up with both PCP as well as cardiology given chest pains and tachycardia. May be residual effects from COVID 19 diagnosis. Strict return precautions have been discussed. Pt is in agreement with plan and stable for discharge home.     MDM Rules/Calculators/A&P HEAR Score: 3                     Final Clinical Impression(s) / ED Diagnoses Final diagnoses:  Nonspecific chest pain  Hypokalemia    Rx / DC Orders ED Discharge Orders         Ordered    Ambulatory referral to Cardiology     05/16/19 1919    potassium chloride (KLOR-CON) 10 MEQ tablet  Daily     05/16/19 1919           Discharge Instructions     Please follow up with your PCP as well as cardiology given your ED visit today. I have placed an ambulatory referral to cardiology - they will call you to  schedule an appointment.   Take Ibuprofen and Tylenol as needed for pain.   Your potassium was mildly low today. We have given you potassium in the ED and discharged home with a short course. You will need to have your potassium level rechecked in 1-2 weeks.         Tanda Rockers, PA-C 05/16/19 1933    Tegeler, Canary Brim, MD 05/17/19 0020

## 2019-05-17 ENCOUNTER — Ambulatory Visit (INDEPENDENT_AMBULATORY_CARE_PROVIDER_SITE_OTHER): Payer: BC Managed Care – PPO | Admitting: Cardiology

## 2019-05-17 ENCOUNTER — Encounter: Payer: Self-pay | Admitting: Cardiology

## 2019-05-17 DIAGNOSIS — R0789 Other chest pain: Secondary | ICD-10-CM

## 2019-05-17 MED ORDER — METOPROLOL SUCCINATE ER 50 MG PO TB24: 50.0000 mg | ORAL_TABLET | Freq: Every day | ORAL | 12 refills | Status: DC

## 2019-05-17 NOTE — Progress Notes (Signed)
Cardiology Office Note:    Date:  05/17/2019   ID:  Rebecca [name], DOB 06/23/1970, MRN [id number]  PCP:  London Pepper, MD  Cardiologist:  Jenean Lindau, MD   Referring MD: Eustaquio Maize, PA-C    ASSESSMENT:    1. Chest discomfort   2. Morbid obesity ([place])    PLAN:    In order of problems listed above:  1. Chest discomfort: Patient symptoms are atypical for coronary etiology and I agree with the emergency room evaluation.  I reassured her about this.  For her tachycardia changed her medications.  Also this will help her cough possibly.  I will stop lisinopril and hydrochlorothiazide.  I started her on Toprol-XL 50 mg daily. 2. Essential hypertension: Blood pressure is stable.  Echocardiogram will be done to assess murmur on auscultation and diet was discussed and salt intake issues were discussed also. 3. Morbid obesity: I discussed diet with the patient extensively and she promises to comply.  She is taking this issue with a lot of seriousness and importance at this time. 4. Follow-up appointment in a month or earlier if she has any concerns.  Patient had multiple questions which were answered to her satisfaction.   Medication Adjustments/Labs and Tests Ordered: Current medicines are reviewed at length with the patient today.  Concerns regarding medicines are outlined above.  No orders of the defined types were placed in this encounter.  No orders of the defined types were placed in this encounter.    History of Present Illness:    Rebecca [name] is a 49 y.o. female who is being seen today for the evaluation of chest discomfort at the request of Eustaquio Maize, [place].  Patient has past medical history of essential hypertension.  She mentions to me that she had Covid infection earlier in January and she subsequently fine from it.  She went to the emergency room with some symptoms of chest pain.  She mentions to me that she has been coughing for the past several weeks.  This is  after the pneumonia.  No orthopnea or PND.  When she moves her chest she has some stabbing-like sensation at times.  She went to the emergency room and I reviewed the records extensively.  I reviewed EKG CT scan report and blood work.  At the time of my evaluation, the patient is alert awake oriented and in no distress.  She leads a sedentary lifestyle.  Past Medical History:  Diagnosis Date  . COVID-19   . History of kidney stones   . Hypertension   . Hypothyroidism   . Thyroid disease   . Ureteral stone with hydronephrosis 11/18/2018    Past Surgical History:  Procedure Laterality Date  . CARPAL TUNNEL RELEASE Right 07/24/2012   Procedure: CARPAL TUNNEL RELEASE;  Surgeon: Carole Civil, MD;  Location: [facility];  Service: Orthopedics;  Laterality: Right;  Right Carpal Tunnel Release  . CYSTOSCOPY     x6 for stones-with stenting  . CYSTOSCOPY W/ URETERAL STENT PLACEMENT Bilateral 02/10/2016   Procedure: CYSTOSCOPY WITH RETROGRADE PYELOGRAM/BILATERAL URETERAL STENT PLACEMENT;  Surgeon: Alexis Frock, MD;  Location: [facility];  Service: Urology;  Laterality: Bilateral;  . CYSTOSCOPY W/ URETERAL STENT PLACEMENT Bilateral 11/18/2018   Procedure: CYSTOSCOPY WITH RETROGRADE PYELOGRAM/URETERAL STENT PLACEMENT;  Surgeon: Alexis Frock, MD;  Location: [facility];  Service: Urology;  Laterality: Bilateral;  . CYSTOSCOPY WITH RETROGRADE PYELOGRAM, URETEROSCOPY AND STENT PLACEMENT Bilateral 11/24/2018   Procedure: CYSTOSCOPY WITH RETROGRADE PYELOGRAM, URETEROSCOPY AND  STENT PLACEMENT;  Surgeon: Sebastian Ache, MD;  Location: [facility];  Service: Urology;  Laterality: Bilateral;  75 MINS  . CYSTOSCOPY/URETEROSCOPY/HOLMIUM LASER/STENT PLACEMENT Bilateral 03/10/2016   Procedure: CYSTOSCOPY/BILATERAL URETEROSCOPY/ BILATERAL HOLMIUM LASER /BILATERALSTENT PLACEMENT/ BILATERAL RETOGRADE PYLEOGRAM;  Surgeon: Sebastian Ache, MD;  Location: [facility];  Service: Urology;  Laterality: Bilateral;  . DILATION AND CURETTAGE OF  UTERUS    . HOLMIUM LASER APPLICATION Bilateral 11/24/2018   Procedure: HOLMIUM LASER APPLICATION;  Surgeon: Sebastian Ache, MD;  Location: [facility];  Service: Urology;  Laterality: Bilateral;  . HYSTEROSCOPY N/A 09/22/2018   Procedure: HYSTEROSCOPY W/ IUD REMOVAL AND REPLACEMENT;  Surgeon: Myna Hidalgo, DO;  Location: [facility];  Service: Gynecology;  Laterality: N/A;  . INTRAUTERINE DEVICE (IUD) INSERTION N/A 09/22/2018   Procedure: Intrauterine Device (Iud) Insertion;  Surgeon: Myna Hidalgo, DO;  Location: [facility];  Service: Gynecology;  Laterality: N/A;  . IUD REMOVAL N/A 09/22/2018   Procedure: Intrauterine Device (Iud) Removal;  Surgeon: Myna Hidalgo, DO;  Location: [facility];  Service: Gynecology;  Laterality: N/A;  . THYROIDECTOMY      Current Medications: Current Meds  Medication Sig  . amphetamine-dextroamphetamine (ADDERALL) 20 MG tablet Take 10-20 mg by mouth See admin instructions. 20 mg in the am and 10 mg in the evening  . benzonatate (TESSALON) 200 MG capsule Take 200 mg by mouth 3 (three) times daily as needed.  . clotrimazole-betamethasone (LOTRISONE) cream APPLY A SMALL AMOUNT TO THE AFFECTED AREA TWICE DAILY AS NEEDED  . hydrochlorothiazide (MICROZIDE) 12.5 MG capsule Take 12.5 mg by mouth daily.  Marland Kitchen levonorgestrel (MIRENA) 20 MCG/24HR IUD 1 each by Intrauterine route once.  Marland Kitchen levothyroxine (SYNTHROID, LEVOTHROID) 175 MCG tablet Take 175 mcg by mouth daily before breakfast.  . potassium chloride (KLOR-CON) 10 MEQ tablet Take 1 tablet (10 mEq total) by mouth daily for 5 days.  . [DISCONTINUED] lisinopril (ZESTRIL) 40 MG tablet Take 40 mg by mouth daily.     Allergies:   Patient has no known allergies.   Social History   Socioeconomic History  . Marital status: Divorced    Spouse name: Not on file  . Number of children: Not on file  . Years of education: Not on file  . Highest education level: Not on file  Occupational History  . Not on file  Tobacco Use  . Smoking  status: Never Smoker  . Smokeless tobacco: Never Used  Substance and Sexual Activity  . Alcohol use: Yes    Comment: occ  . Drug use: No  . Sexual activity: Yes    Birth control/protection: I.U.D.  Other Topics Concern  . Not on file  Social History Narrative  . Not on file   Social Determinants of Health   Financial Resource Strain:   . Difficulty of Paying Living Expenses: Not on file  Food Insecurity:   . Worried About Programme researcher, broadcasting/film/video in the Last Year: Not on file  . Ran Out of Food in the Last Year: Not on file  Transportation Needs:   . Lack of Transportation (Medical): Not on file  . Lack of Transportation (Non-Medical): Not on file  Physical Activity:   . Days of Exercise per Week: Not on file  . Minutes of Exercise per Session: Not on file  Stress:   . Feeling of Stress : Not on file  Social Connections:   . Frequency of Communication with Friends and Family: Not on file  . Frequency of Social Gatherings with Friends and Family:  Not on file  . Attends Religious Services: Not on file  . Active Member of Clubs or Organizations: Not on file  . Attends Banker Meetings: Not on file  . Marital Status: Not on file     Family History: The patient's family history includes Bladder Cancer in her maternal grandfather; Dementia in her father and mother; Heart attack in her father, maternal grandmother, and mother; Kidney failure in her sister; Lupus in her sister; Pulmonary embolism in her mother; Stroke in her maternal grandmother and mother.  ROS:   Please see the history of present illness.    All other systems reviewed and are negative.  EKGs/Labs/Other Studies Reviewed:    The following studies were reviewed today: Labs from emergency room were reviewed extensively and EKG reveals sinus tachycardia and nonspecific ST-T changes.   Recent Labs: 05/16/2019: ALT 43; BUN 21; Creatinine, Ser 1.00; Hemoglobin 15.2; Platelets 307; Potassium 3.3; Sodium 139   Recent Lipid Panel No results found for: CHOL, TRIG, HDL, CHOLHDL, VLDL, LDLCALC, LDLDIRECT  Physical Exam:    VS:  BP 112/70   Pulse (!) 110   Temp 97.8 F (36.6 C)   Ht 5\' 5"  (1.651 m)   Wt (!) 330 lb 1.9 oz (149.7 kg)   SpO2 97%   BMI 54.93 kg/m     Wt Readings from Last 3 Encounters:  05/17/19 (!) 330 lb 1.9 oz (149.7 kg)  05/16/19 (!) 332 lb (150.6 kg)  11/24/18 (!) 330 lb (149.7 kg)     GEN: Patient is in no acute distress HEENT: Normal NECK: No JVD; No carotid bruits LYMPHATICS: No lymphadenopathy CARDIAC: S1 S2 regular, 2/6 systolic murmur at the apex. RESPIRATORY:  Clear to auscultation without rales, wheezing or rhonchi  ABDOMEN: Soft, non-tender, non-distended MUSCULOSKELETAL:  No edema; No deformity  SKIN: Warm and dry NEUROLOGIC:  Alert and oriented x 3 PSYCHIATRIC:  Normal affect    Signed, 11/26/18, MD  05/17/2019 3:24 PM     [facility]

## 2019-05-17 NOTE — Patient Instructions (Signed)
Medication Instructions:  Your physician has recommended you make the following change in your medication:   Stop Lispinopril Stop HCTZ Start Toprol XL 50 mg  *If you need a refill on your cardiac medications before your next appointment, please call your pharmacy*  Lab Work: None ordered If you have labs (blood work) drawn today and your tests are completely normal, you will receive your results only by: Marland Kitchen MyChart Message (if you have MyChart) OR . A paper copy in the mail If you have any lab test that is abnormal or we need to change your treatment, we will call you to review the results.  Testing/Procedures: Your physician has requested that you have an echocardiogram. Echocardiography is a painless test that uses sound waves to create images of your heart. It provides your doctor with information about the size and shape of your heart and how well your heart's chambers and valves are working. This procedure takes approximately one hour. There are no restrictions for this procedure.    Follow-Up: At [facility], you and your health needs are our priority.  As part of our continuing mission to provide you with exceptional heart care, we have created designated Provider Care Teams.  These Care Teams include your primary Cardiologist (physician) and Advanced Practice Providers (APPs -  Physician Assistants and Nurse Practitioners) who all work together to provide you with the care you need, when you need it.  Your next appointment:   1 month(s)  The format for your next appointment:   In Person  Provider:   Belva Crome, MD  Other Instructions  Echocardiogram An echocardiogram is a procedure that uses painless sound waves (ultrasound) to produce an image of the heart. Images from an echocardiogram can provide important information about:  Signs of coronary artery disease (CAD).  Aneurysm detection. An aneurysm is a weak or damaged part of an artery wall that bulges out  from the normal force of blood pumping through the body.  Heart size and shape. Changes in the size or shape of the heart can be associated with certain conditions, including heart failure, aneurysm, and CAD.  Heart muscle function.  Heart valve function.  Signs of a past heart attack.  Fluid buildup around the heart.  Thickening of the heart muscle.  A tumor or infectious growth around the heart valves. Tell a health care provider about:  Any allergies you have.  All medicines you are taking, including vitamins, herbs, eye drops, creams, and over-the-counter medicines.  Any blood disorders you have.  Any surgeries you have had.  Any medical conditions you have.  Whether you are pregnant or may be pregnant. What are the risks? Generally, this is a safe procedure. However, problems may occur, including:  Allergic reaction to dye (contrast) that may be used during the procedure. What happens before the procedure? No specific preparation is needed. You may eat and drink normally. What happens during the procedure?   An IV tube may be inserted into one of your veins.  You may receive contrast through this tube. A contrast is an injection that improves the quality of the pictures from your heart.  A gel will be applied to your chest.  A wand-like tool (transducer) will be moved over your chest. The gel will help to transmit the sound waves from the transducer.  The sound waves will harmlessly bounce off of your heart to allow the heart images to be captured in real-time motion. The images will be recorded on  a computer. The procedure may vary among health care providers and hospitals. What happens after the procedure?  You may return to your normal, everyday life, including diet, activities, and medicines, unless your health care provider tells you not to do that. Summary  An echocardiogram is a procedure that uses painless sound waves (ultrasound) to produce an image  of the heart.  Images from an echocardiogram can provide important information about the size and shape of your heart, heart muscle function, heart valve function, and fluid buildup around your heart.  You do not need to do anything to prepare before this procedure. You may eat and drink normally.  After the echocardiogram is completed, you may return to your normal, everyday life, unless your health care provider tells you not to do that. This information is not intended to replace advice given to you by your health care provider. Make sure you discuss any questions you have with your health care provider. Document Revised: 07/13/2018 Document Reviewed: 04/24/2016 Elsevier Patient Education  [place].

## 2019-05-28 ENCOUNTER — Ambulatory Visit (HOSPITAL_BASED_OUTPATIENT_CLINIC_OR_DEPARTMENT_OTHER)
Admission: RE | Admit: 2019-05-28 | Discharge: 2019-05-28 | Disposition: A | Discharge status: Home / Self Care | Payer: BC Managed Care – PPO | Source: Ambulatory Visit | Attending: Cardiology | Admitting: Cardiology

## 2019-05-28 ENCOUNTER — Other Ambulatory Visit: Payer: Self-pay

## 2019-05-28 DIAGNOSIS — R0789 Other chest pain: Secondary | ICD-10-CM

## 2019-05-28 NOTE — Progress Notes (Signed)
  Echocardiogram 2D Echocardiogram has been performed.  Rebecca [name] 05/28/2019, 2:50 PM

## 2019-05-29 DIAGNOSIS — Z01419 Encounter for gynecological examination (general) (routine) without abnormal findings: Secondary | ICD-10-CM | POA: Diagnosis not present

## 2019-05-30 ENCOUNTER — Telehealth: Payer: Self-pay

## 2019-05-30 NOTE — Telephone Encounter (Signed)
Results reviewed with pt as per Dr. Revankar's note.  Pt verbalized understanding and had no additional questions.   

## 2019-06-06 ENCOUNTER — Ambulatory Visit: Payer: BC Managed Care – PPO | Admitting: Pulmonary Disease

## 2019-06-06 ENCOUNTER — Other Ambulatory Visit: Payer: Self-pay

## 2019-06-06 ENCOUNTER — Encounter: Payer: Self-pay | Admitting: Pulmonary Disease

## 2019-06-06 VITALS — BP 124/80 | HR 80 | Temp 97.3°F | Ht 65.0 in | Wt 338.4 lb

## 2019-06-06 DIAGNOSIS — R05 Cough: Secondary | ICD-10-CM | POA: Diagnosis not present

## 2019-06-06 DIAGNOSIS — R059 Cough, unspecified: Secondary | ICD-10-CM

## 2019-06-06 NOTE — Patient Instructions (Addendum)
Glad you are feeling well after your recent Covid infection I encourage you to exercise and lose weight with diet We will get a chest x-ray today Follow-up in 3 months

## 2019-06-06 NOTE — Progress Notes (Signed)
         Rebecca [name]    [id number]    [birth year]  Primary Care Physician:Morrow, Clifton Custard, MD  Referring Physician: Farris Has, MD [address] [place],  [place] [address]  Chief complaint:   Consult for post COVID-19 cough  HPI: 49 year old with obesity, ADD, hypothyroidism. Diagnosed with COVID-19 04/17/2019.  Patient stayed at home and did not require hospitalization.  Treated with prednisone as an outpatient Complains of chronic cough with decreased, dyspnea since this episode She was evaluated by ED on 2/10 for dyspnea, pleuritic chest pain There was elevated but CT chest did not show any pulmonary embolism.  There were mild groundglass opacities.  Also was evaluated by cardiology for atypical chest pain  Overall she states that she is feeling well with improving systems.  She is working on weight loss with intermittent fasting and is asking if it safe to start an exercise  Pets: Cats Occupation: Emergency planning/management officer for an [employer] Exposures: No known exposures.  No mold, hot tub, Jacuzzi. Smoking history: Never smoker Travel history: Originally from [place].  No significant recent travel Relevant family history: No family history of lung disease   Outpatient Encounter Medications as of 06/06/2019  Medication Sig  . amphetamine-dextroamphetamine (ADDERALL) 20 MG tablet Take 10-20 mg by mouth See admin instructions. 20 mg in the am and 10 mg in the evening  . clotrimazole-betamethasone (LOTRISONE) cream APPLY A SMALL AMOUNT TO THE AFFECTED AREA TWICE DAILY AS NEEDED  . levonorgestrel (MIRENA) 20 MCG/24HR IUD 1 each by Intrauterine route once.  Marland Kitchen levothyroxine (SYNTHROID, LEVOTHROID) 175 MCG tablet Take 175 mcg by mouth daily before breakfast.  . metoprolol succinate (TOPROL-XL) 50 MG 24 hr tablet Take 1 tablet (50 mg total) by mouth daily. Take with or immediately following a meal.  . potassium chloride (KLOR-CON) 10 MEQ tablet Take 1 tablet (10 mEq  total) by mouth daily for 5 days.  . [DISCONTINUED] benzonatate (TESSALON) 200 MG capsule Take 200 mg by mouth 3 (three) times daily as needed.   No facility-administered encounter medications on file as of 06/06/2019.   Physical Exam: Blood pressure 124/80, pulse 80, temperature (!) 97.3 F (36.3 C), temperature source Temporal, height 5\' 5"  (1.651 m), weight (!) 338 lb 6.4 oz (153.5 kg), SpO2 98 %. Gen:      No acute distress HEENT:  EOMI, sclera anicteric Neck:     No masses; no thyromegaly Lungs:    Clear to auscultation bilaterally; normal respiratory effort CV:         Regular rate and rhythm; no murmurs Abd:      + bowel sounds; soft, non-tender; no palpable masses, no distension Ext:    No edema; adequate peripheral perfusion Skin:      Warm and dry; no rash Neuro: alert and oriented x 3 Psych: normal mood and affect  Data Reviewed: Imaging: CTA 05/16/2019-no PE, mild bilateral hazy infiltrates. I have reviewed the images personally.  Assessment:  Post COVID-19 Recent CT scan reviewed with very mild opacities, no interstitial lung disease consistent with resolving COVID-19 pneumonia Repeat chest x-ray today Encouraged her to continue with weight loss and diet and exercise. If her symptoms persist then we can consider PFTs for   Plan/Recommendations: - Chest x-ray - Weight loss with diet and exercise  07/14/2019 MD [facility] 06/06/2019, 12:23 PM  CC: 08/06/2019, MD

## 2019-06-14 ENCOUNTER — Encounter: Payer: Self-pay | Admitting: Cardiology

## 2019-06-14 ENCOUNTER — Ambulatory Visit (INDEPENDENT_AMBULATORY_CARE_PROVIDER_SITE_OTHER): Payer: BC Managed Care – PPO | Admitting: Cardiology

## 2019-06-14 ENCOUNTER — Other Ambulatory Visit: Payer: Self-pay

## 2019-06-14 VITALS — BP 108/86 | HR 86 | Ht 65.0 in | Wt 336.0 lb

## 2019-06-14 DIAGNOSIS — I10 Essential (primary) hypertension: Secondary | ICD-10-CM

## 2019-06-14 DIAGNOSIS — R002 Palpitations: Secondary | ICD-10-CM | POA: Diagnosis not present

## 2019-06-14 DIAGNOSIS — R0789 Other chest pain: Secondary | ICD-10-CM | POA: Diagnosis not present

## 2019-06-14 DIAGNOSIS — Z1329 Encounter for screening for other suspected endocrine disorder: Secondary | ICD-10-CM | POA: Diagnosis not present

## 2019-06-14 NOTE — Patient Instructions (Signed)
Medication Instructions:  No medication changes *If you need a refill on your cardiac medications before your next appointment, please call your pharmacy*   Lab Work: You need to have labs done when you are fasting.  You can come Monday through Friday 8:30 am to 12:00 pm and 1:15 to 4:30. You do not need to make an appointment as the order has already been placed. The labs you are going to have done are BMET, CBC, TSH, LFT and Lipids.  If you have labs (blood work) drawn today and your tests are completely normal, you will receive your results only by: . MyChart Message (if you have MyChart) OR . A paper copy in the mail If you have any lab test that is abnormal or we need to change your treatment, we will call you to review the results.   Testing/Procedures: None ordered   Follow-Up: At [facility], you and your health needs are our priority.  As part of our continuing mission to provide you with exceptional heart care, we have created designated Provider Care Teams.  These Care Teams include your primary Cardiologist (physician) and Advanced Practice Providers (APPs -  Physician Assistants and Nurse Practitioners) who all work together to provide you with the care you need, when you need it.  We recommend signing up for the patient portal called "MyChart".  Sign up information is provided on this After Visit Summary.  MyChart is used to connect with patients for Virtual Visits (Telemedicine).  Patients are able to view lab/test results, encounter notes, upcoming appointments, etc.  Non-urgent messages can be sent to your provider as well.   To learn more about what you can do with MyChart, go to [email or web address].    Your next appointment:   6 month(s)  The format for your next appointment:   In Person  Provider:   Rajan Revankar, MD   Other Instructions NA  

## 2019-06-14 NOTE — Progress Notes (Signed)
Cardiology Office Note:    Date:  06/14/2019   ID:  Festus Holts, DOB 1970/08/08, MRN [id number]  PCP:  Farris Has, MD  Cardiologist:  Garwin Brothers, MD   Referring MD: Farris Has, MD    ASSESSMENT:    1. Screening for thyroid disorder   2. Chest discomfort   3. Morbid obesity (HCC)   4. Palpitations   5. Essential hypertension    PLAN:    In order of problems listed above:  1. Palpitations: These have resolved completely.  She does not walking regularly.  Her cough is completely disappeared also.  With exertion she has no symptoms.  She is taking beta-blocker on a regular basis. 2. Patient: Blood pressure is stable and diet was emphasized with the patient.  Salt intake issues were discussed and she promises to comply 3. Morbid obesity she is taking steps of exercise and dieting to lose weight not very happy about her efforts 4. She had multiple concerns and addressed them.  Before her next visit she will be coming here for complete blood work including lipids.   Medication Adjustments/Labs and Tests Ordered: Current medicines are reviewed at length with the patient today.  Concerns regarding medicines are outlined above.  Orders Placed This Encounter  Procedures  . Basic metabolic panel  . CBC with Differential/Platelet  . Hepatic function panel  . Lipid panel  . TSH   No orders of the defined types were placed in this encounter.    Chief Complaint  Patient presents with  . Follow-up     History of Present Illness:    Rebecca [name] is a 49 y.o. female.  Patient was evaluated by me for palpitations.  She gives history of cough.  Therefore I changed her ACE inhibitor to beta-blocker.  She is feeling much better.  Her cough is completely resolved and her palpitations have resolved.  She is walking on a regular basis and dieting very well to lose weight.  At the time of my evaluation, the patient is alert awake oriented and in no distress.  Past Medical  History:  Diagnosis Date  . COVID-19   . History of kidney stones   . Hypertension   . Hypothyroidism   . Thyroid disease   . Ureteral stone with hydronephrosis 11/18/2018    Past Surgical History:  Procedure Laterality Date  . CARPAL TUNNEL RELEASE Right 07/24/2012   Procedure: CARPAL TUNNEL RELEASE;  Surgeon: Vickki Hearing, MD;  Location: [facility];  Service: Orthopedics;  Laterality: Right;  Right Carpal Tunnel Release  . CYSTOSCOPY     x6 for stones-with stenting  . CYSTOSCOPY W/ URETERAL STENT PLACEMENT Bilateral 02/10/2016   Procedure: CYSTOSCOPY WITH RETROGRADE PYELOGRAM/BILATERAL URETERAL STENT PLACEMENT;  Surgeon: Sebastian Ache, MD;  Location: [facility];  Service: Urology;  Laterality: Bilateral;  . CYSTOSCOPY W/ URETERAL STENT PLACEMENT Bilateral 11/18/2018   Procedure: CYSTOSCOPY WITH RETROGRADE PYELOGRAM/URETERAL STENT PLACEMENT;  Surgeon: Sebastian Ache, MD;  Location: [facility];  Service: Urology;  Laterality: Bilateral;  . CYSTOSCOPY WITH RETROGRADE PYELOGRAM, URETEROSCOPY AND STENT PLACEMENT Bilateral 11/24/2018   Procedure: CYSTOSCOPY WITH RETROGRADE PYELOGRAM, URETEROSCOPY AND STENT PLACEMENT;  Surgeon: Sebastian Ache, MD;  Location: [facility];  Service: Urology;  Laterality: Bilateral;  75 MINS  . CYSTOSCOPY/URETEROSCOPY/HOLMIUM LASER/STENT PLACEMENT Bilateral 03/10/2016   Procedure: CYSTOSCOPY/BILATERAL URETEROSCOPY/ BILATERAL HOLMIUM LASER /BILATERALSTENT PLACEMENT/ BILATERAL RETOGRADE PYLEOGRAM;  Surgeon: Sebastian Ache, MD;  Location: [facility];  Service: Urology;  Laterality: Bilateral;  . DILATION AND CURETTAGE  OF UTERUS    . HOLMIUM LASER APPLICATION Bilateral 07/12/8117   Procedure: HOLMIUM LASER APPLICATION;  Surgeon: Alexis Frock, MD;  Location: [facility];  Service: Urology;  Laterality: Bilateral;  . HYSTEROSCOPY N/A 09/22/2018   Procedure: HYSTEROSCOPY W/ IUD REMOVAL AND REPLACEMENT;  Surgeon: Janyth Pupa, DO;  Location: [facility];  Service: Gynecology;  Laterality: N/A;    . INTRAUTERINE DEVICE (IUD) INSERTION N/A 09/22/2018   Procedure: Intrauterine Device (Iud) Insertion;  Surgeon: Janyth Pupa, DO;  Location: [place];  Service: Gynecology;  Laterality: N/A;  . IUD REMOVAL N/A 09/22/2018   Procedure: Intrauterine Device (Iud) Removal;  Surgeon: Janyth Pupa, DO;  Location: [facility];  Service: Gynecology;  Laterality: N/A;  . THYROIDECTOMY      Current Medications: Current Meds  Medication Sig  . amphetamine-dextroamphetamine (ADDERALL) 20 MG tablet Take 10-20 mg by mouth See admin instructions. 20 mg in the am and 10 mg in the evening  . clotrimazole-betamethasone (LOTRISONE) cream APPLY A SMALL AMOUNT TO THE AFFECTED AREA TWICE DAILY AS NEEDED  . levonorgestrel (MIRENA) 20 MCG/24HR IUD 1 each by Intrauterine route once.  Marland Kitchen levothyroxine (SYNTHROID, LEVOTHROID) 175 MCG tablet Take 175 mcg by mouth daily before breakfast.  . metoprolol succinate (TOPROL-XL) 50 MG 24 hr tablet Take 1 tablet (50 mg total) by mouth daily. Take with or immediately following a meal.     Allergies:   Patient has no known allergies.   Social History   Socioeconomic History  . Marital status: Divorced    Spouse name: Not on file  . Number of children: Not on file  . Years of education: Not on file  . Highest education level: Not on file  Occupational History  . Not on file  Tobacco Use  . Smoking status: Never Smoker  . Smokeless tobacco: Never Used  Substance and Sexual Activity  . Alcohol use: Yes    Comment: occ  . Drug use: No  . Sexual activity: Yes    Birth control/protection: I.U.D.  Other Topics Concern  . Not on file  Social History Narrative  . Not on file   Social Determinants of Health   Financial Resource Strain:   . Difficulty of Paying Living Expenses:   Food Insecurity:   . Worried About Charity fundraiser in the Last Year:   . Arboriculturist in the Last Year:   Transportation Needs:   . Film/video editor (Medical):   Marland Kitchen Lack of  Transportation (Non-Medical):   Physical Activity:   . Days of Exercise per Week:   . Minutes of Exercise per Session:   Stress:   . Feeling of Stress :   Social Connections:   . Frequency of Communication with Friends and Family:   . Frequency of Social Gatherings with Friends and Family:   . Attends Religious Services:   . Active Member of Clubs or Organizations:   . Attends Archivist Meetings:   Marland Kitchen Marital Status:      Family History: The patient's family history includes Bladder Cancer in her maternal grandfather; Dementia in her father and mother; Heart attack in her father, maternal grandmother, and mother; Kidney failure in her sister; Lupus in her sister; Pulmonary embolism in her mother; Stroke in her maternal grandmother and mother.  ROS:   Please see the history of present illness.    All other systems reviewed and are negative.  EKGs/Labs/Other Studies Reviewed:    The following studies were reviewed today:  IMPRESSIONS    1. Technically limited study.  2. Left ventricular ejection fraction, by estimation, is 60 to 65%. The  left ventricle has normal function. The left ventricle has no regional  wall motion abnormalities. Left ventricular diastolic parameters were  normal.  3. Right ventricular systolic function is normal. The right ventricular  size is normal.  4. The mitral valve is normal in structure and function. No evidence of  mitral valve regurgitation. No evidence of mitral stenosis.  5. The aortic valve is normal in structure and function. Aortic valve  regurgitation is not visualized. No aortic stenosis is present.  6. The inferior vena cava is normal in size with greater than 50%  respiratory variability, suggesting right atrial pressure of 3 mmHg.    Recent Labs: 05/16/2019: ALT 43; BUN 21; Creatinine, Ser 1.00; Hemoglobin 15.2; Platelets 307; Potassium 3.3; Sodium 139  Recent Lipid Panel No results found for: CHOL, TRIG, HDL,  CHOLHDL, VLDL, LDLCALC, LDLDIRECT  Physical Exam:    VS:  BP 108/86   Pulse 86   Ht 5\' 5"  (1.651 m)   Wt (!) 336 lb (152.4 kg)   SpO2 93%   BMI 55.91 kg/m     Wt Readings from Last 3 Encounters:  06/14/19 (!) 336 lb (152.4 kg)  06/06/19 (!) 338 lb 6.4 oz (153.5 kg)  05/17/19 (!) 330 lb 1.9 oz (149.7 kg)     GEN: Patient is in no acute distress HEENT: Normal NECK: No JVD; No carotid bruits LYMPHATICS: No lymphadenopathy CARDIAC: Hear sounds regular, 2/6 systolic murmur at the apex. RESPIRATORY:  Clear to auscultation without rales, wheezing or rhonchi  ABDOMEN: Soft, non-tender, non-distended MUSCULOSKELETAL:  No edema; No deformity  SKIN: Warm and dry NEUROLOGIC:  Alert and oriented x 3 PSYCHIATRIC:  Normal affect   Signed, 07/15/19, MD  06/14/2019 8:55 AM    [facility]

## 2019-07-16 ENCOUNTER — Ambulatory Visit (INDEPENDENT_AMBULATORY_CARE_PROVIDER_SITE_OTHER): Payer: BC Managed Care – PPO

## 2019-07-16 ENCOUNTER — Other Ambulatory Visit: Payer: Self-pay

## 2019-07-16 ENCOUNTER — Other Ambulatory Visit: Payer: Self-pay | Admitting: Podiatry

## 2019-07-16 ENCOUNTER — Encounter: Payer: Self-pay | Admitting: Podiatry

## 2019-07-16 ENCOUNTER — Ambulatory Visit (INDEPENDENT_AMBULATORY_CARE_PROVIDER_SITE_OTHER): Payer: BC Managed Care – PPO | Admitting: Podiatry

## 2019-07-16 VITALS — Temp 96.5°F

## 2019-07-16 DIAGNOSIS — M779 Enthesopathy, unspecified: Secondary | ICD-10-CM

## 2019-07-16 DIAGNOSIS — M21619 Bunion of unspecified foot: Secondary | ICD-10-CM

## 2019-07-16 DIAGNOSIS — M722 Plantar fascial fibromatosis: Secondary | ICD-10-CM

## 2019-07-16 MED ORDER — DICLOFENAC SODIUM 75 MG PO TBEC: 75.0000 mg | DELAYED_RELEASE_TABLET | Freq: Two times a day (BID) | ORAL | 2 refills | Status: DC

## 2019-07-17 NOTE — Progress Notes (Signed)
Subjective:   Patient ID: Rebecca [name], female   DOB: 49 y.o.   MRN: [id number]   HPI Patient states she is developed a lot of forefoot pain left and has history of mild to moderate bunion which can become sore but states the last few months has been very sore in the left forefoot with the second toe with what appears to be moving from previous work with inflammation pain of the right heel which is migrated more to the lateral side that is been something that is been bothering her for a long time.  Patient does not smoke has some trouble breathing after having had Covid but is recovering   Review of Systems  All other systems reviewed and are negative.       Objective:  Physical Exam Vitals and nursing note reviewed.  Constitutional:      Appearance: She is well-developed.  Pulmonary:     Effort: Pulmonary effort is normal.  Musculoskeletal:        General: Normal range of motion.  Skin:    General: Skin is warm.  Neurological:     Mental Status: She is alert.     Neurovascular status intact muscle strength found to be adequate with range of motion adequate.  Patient has exquisite discomfort second MPJ left with inflammation fluid around the joint surface and pain with medial dislocation of the toe and is found to have inflammation pain of the plantar heel right in the center and center lateral aspect of the plantar fascia.  Patient has good digital perfusion well oriented x3     Assessment:  Acute capsulitis of the second MPJ left with plantar fascial inflammation heel right     Plan:  H&P reviewed both conditions and for the left I did do a forefoot block I aspirated the second MPJ getting a small amount of clear fluid injected quarter cc dexamethasone Kenalog and applied padding and did a plantar lateral central fascial injection 3 mg Kenalog 5 mg Xylocaine discussed long-term orthotics and possible other treatments also explained the possibility for the flexor plate  dislocation left.  Reappoint 2 weeks  X-rays indicate that there is some medial rotation second digit left with elongated structure and heel spur formation right

## 2019-07-25 DIAGNOSIS — R7303 Prediabetes: Secondary | ICD-10-CM | POA: Diagnosis not present

## 2019-07-25 DIAGNOSIS — Z13220 Encounter for screening for lipoid disorders: Secondary | ICD-10-CM | POA: Diagnosis not present

## 2019-07-25 DIAGNOSIS — Z0000 Encounter for general adult medical examination without abnormal findings: Secondary | ICD-10-CM | POA: Diagnosis not present

## 2019-07-25 DIAGNOSIS — I10 Essential (primary) hypertension: Secondary | ICD-10-CM | POA: Diagnosis not present

## 2019-07-25 DIAGNOSIS — E039 Hypothyroidism, unspecified: Secondary | ICD-10-CM | POA: Diagnosis not present

## 2019-07-26 DIAGNOSIS — R7303 Prediabetes: Secondary | ICD-10-CM | POA: Diagnosis not present

## 2019-07-26 DIAGNOSIS — F900 Attention-deficit hyperactivity disorder, predominantly inattentive type: Secondary | ICD-10-CM | POA: Diagnosis not present

## 2019-07-26 DIAGNOSIS — E039 Hypothyroidism, unspecified: Secondary | ICD-10-CM | POA: Diagnosis not present

## 2019-07-30 ENCOUNTER — Other Ambulatory Visit: Payer: Self-pay

## 2019-07-30 ENCOUNTER — Encounter: Payer: Self-pay | Admitting: Podiatry

## 2019-07-30 ENCOUNTER — Ambulatory Visit (INDEPENDENT_AMBULATORY_CARE_PROVIDER_SITE_OTHER): Payer: BC Managed Care – PPO | Admitting: Podiatry

## 2019-07-30 VITALS — Temp 98.2°F

## 2019-07-30 DIAGNOSIS — M2041 Other hammer toe(s) (acquired), right foot: Secondary | ICD-10-CM

## 2019-07-30 DIAGNOSIS — M21619 Bunion of unspecified foot: Secondary | ICD-10-CM

## 2019-07-30 DIAGNOSIS — M779 Enthesopathy, unspecified: Secondary | ICD-10-CM | POA: Diagnosis not present

## 2019-07-30 DIAGNOSIS — M2042 Other hammer toe(s) (acquired), left foot: Secondary | ICD-10-CM

## 2019-07-30 NOTE — Patient Instructions (Signed)
Pre-Operative Instructions  Congratulations, you have decided to take an important step towards improving your quality of life.  You can be assured that the doctors and staff at [facility] will be with you every step of the way.  Here are some important things you should know:  1. Plan to be at the surgery center/hospital at least 1 (one) hour prior to your scheduled time, unless otherwise directed by the surgical center/hospital staff.  You must have a responsible adult accompany you, remain during the surgery and drive you home.  Make sure you have directions to the surgical center/hospital to ensure you arrive on time. 2. If you are having surgery at [facility] or  hospitals, you will need a copy of your medical history and physical form from your family physician within one month prior to the date of surgery. We will give you a form for your primary physician to complete.  3. We make every effort to accommodate the date you request for surgery.  However, there are times where surgery dates or times have to be moved.  We will contact you as soon as possible if a change in schedule is required.   4. No aspirin/ibuprofen for one week before surgery.  If you are on aspirin, any non-steroidal anti-inflammatory medications (Mobic, Aleve, Ibuprofen) should not be taken seven (7) days prior to your surgery.  You make take Tylenol for pain prior to surgery.  5. Medications - If you are taking daily heart and blood pressure medications, seizure, reflux, allergy, asthma, anxiety, pain or diabetes medications, make sure you notify the surgery center/hospital before the day of surgery so they can tell you which medications you should take or avoid the day of surgery. 6. No food or drink after midnight the night before surgery unless directed otherwise by surgical center/hospital staff. 7. No alcoholic beverages 24-hours prior to surgery.  No smoking 24-hours prior or 24-hours after  surgery. 8. Wear loose pants or shorts. They should be loose enough to fit over bandages, boots, and casts. 9. Don't wear slip-on shoes. Sneakers are preferred. 10. Bring your boot with you to the surgery center/hospital.  Also bring crutches or a walker if your physician has prescribed it for you.  If you do not have this equipment, it will be provided for you after surgery. 11. If you have not been contacted by the surgery center/hospital by the day before your surgery, call to confirm the date and time of your surgery. 12. Leave-time from work may vary depending on the type of surgery you have.  Appropriate arrangements should be made prior to surgery with your employer. 13. Prescriptions will be provided immediately following surgery by your doctor.  Fill these as soon as possible after surgery and take the medication as directed. Pain medications will not be refilled on weekends and must be approved by the doctor. 14. Remove nail polish on the operative foot and avoid getting pedicures prior to surgery. 15. Wash the night before surgery.  The night before surgery wash the foot and leg well with water and the antibacterial soap provided. Be sure to pay special attention to beneath the toenails and in between the toes.  Wash for at least three (3) minutes. Rinse thoroughly with water and dry well with a towel.  Perform this wash unless told not to do so by your physician.  Enclosed: 1 Ice pack (please put in freezer the night before surgery)   1 Hibiclens skin cleaner     Pre-op instructions  If you have any questions regarding the instructions, please do not hesitate to call our office.  [place]: [address], [place], [place] [address] -- [phone]  [place]: [address] , [place] [address] -- [phone]  [place]: [address], [place], [place] [address] -- [phone]   Website: [email or web address] 

## 2019-08-02 NOTE — Progress Notes (Signed)
Subjective:   Patient ID: Rebecca [name], female   DOB: 49 y.o.   MRN: [id number]   HPI Patient presents stating I am still having a lot of problems especially with my left over my right foot and states that this bunion is bothersome making it almost possible to wear shoe gear and he wants she wants to be able to exercise to lose weight and the joint is still sore with the medication we used helping temporarily but it is already starting to come back and she wants to have the foot fixed.  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  Patient just had blood work and everything was good and she wants to pursue correction before considering weight loss surgery and increased activity.  I recommended distal osteotomy first left shortening osteotomy second left with digital fusion I did explain there may be arthritis in the joint and there is no guarantee this will solve the problem and I did allow her to read consent form going over alternative treatments complications and the fact there is no long-term guarantee as far success.  Patient wants surgery and after extensive review signed consent form is encouraged to call with questions and is scheduled for outpatient procedure in the next couple weeks.  I also  dispensed air fracture walker that I want her to get used to prior to surgery and to find shoes on the other foot that fit it well

## 2019-08-09 ENCOUNTER — Telehealth: Payer: Self-pay

## 2019-08-09 NOTE — Telephone Encounter (Signed)
DOS 08/28/2019  [place] BUNIONECTOMY LT - 28296 METATARSAL OSTEOTOMY 2ND LT - 28308 HAMMERTOE REPAIR 2ND LT - [id number]  [employer] ST EFFECTIVE DATE - 08/04/2019     In-Network   Max Per Benefit Period Year-to-Date Remaining     CoInsurance 30%       Deductible $1750.00 $1750.00     Out-Of-Pocket 3 $3000.00 $3000.00  Copay Not Applicable Coinsurance 40%  per  Service Year  Authorization Required No

## 2019-08-10 DIAGNOSIS — Z03818 Encounter for observation for suspected exposure to other biological agents ruled out: Secondary | ICD-10-CM | POA: Diagnosis not present

## 2019-08-13 DIAGNOSIS — U071 COVID-19: Secondary | ICD-10-CM | POA: Diagnosis not present

## 2019-08-13 DIAGNOSIS — Z20828 Contact with and (suspected) exposure to other viral communicable diseases: Secondary | ICD-10-CM | POA: Diagnosis not present

## 2019-08-13 DIAGNOSIS — Z20822 Contact with and (suspected) exposure to covid-19: Secondary | ICD-10-CM | POA: Diagnosis not present

## 2019-08-27 MED ORDER — ONDANSETRON HCL 4 MG PO TABS: 4.0000 mg | ORAL_TABLET | Freq: Three times a day (TID) | ORAL | 0 refills | Status: DC | PRN

## 2019-08-27 MED ORDER — OXYCODONE-ACETAMINOPHEN 10-325 MG PO TABS: 1.0000 | ORAL_TABLET | ORAL | 0 refills | Status: DC | PRN

## 2019-08-27 NOTE — Addendum Note (Signed)
Addended by: Lenn Sink on: 08/27/2019 03:13 PM   Modules accepted: Orders

## 2019-08-28 ENCOUNTER — Encounter: Payer: Self-pay | Admitting: Podiatry

## 2019-08-28 DIAGNOSIS — M2012 Hallux valgus (acquired), left foot: Secondary | ICD-10-CM

## 2019-08-28 DIAGNOSIS — M722 Plantar fascial fibromatosis: Secondary | ICD-10-CM | POA: Diagnosis not present

## 2019-08-28 DIAGNOSIS — M2042 Other hammer toe(s) (acquired), left foot: Secondary | ICD-10-CM | POA: Diagnosis not present

## 2019-08-28 DIAGNOSIS — M216X2 Other acquired deformities of left foot: Secondary | ICD-10-CM | POA: Diagnosis not present

## 2019-08-28 DIAGNOSIS — I10 Essential (primary) hypertension: Secondary | ICD-10-CM | POA: Diagnosis not present

## 2019-08-28 DIAGNOSIS — M21542 Acquired clubfoot, left foot: Secondary | ICD-10-CM

## 2019-09-05 ENCOUNTER — Ambulatory Visit (INDEPENDENT_AMBULATORY_CARE_PROVIDER_SITE_OTHER): Payer: Self-pay

## 2019-09-05 ENCOUNTER — Other Ambulatory Visit: Payer: Self-pay

## 2019-09-05 ENCOUNTER — Encounter: Payer: BC Managed Care – PPO | Admitting: Podiatry

## 2019-09-05 ENCOUNTER — Ambulatory Visit (INDEPENDENT_AMBULATORY_CARE_PROVIDER_SITE_OTHER): Payer: BC Managed Care – PPO | Admitting: Podiatry

## 2019-09-05 ENCOUNTER — Encounter: Payer: Self-pay | Admitting: Podiatry

## 2019-09-05 VITALS — Temp 96.6°F

## 2019-09-05 DIAGNOSIS — M2042 Other hammer toe(s) (acquired), left foot: Secondary | ICD-10-CM

## 2019-09-05 DIAGNOSIS — M779 Enthesopathy, unspecified: Secondary | ICD-10-CM

## 2019-09-05 MED ORDER — OXYCODONE-ACETAMINOPHEN 10-325 MG PO TABS: 1.0000 | ORAL_TABLET | ORAL | 0 refills | Status: DC | PRN

## 2019-09-05 NOTE — Progress Notes (Signed)
Subjective:   Patient ID: Rebecca [name], female   DOB: 49 y.o.   MRN: [id number]   HPI Patient presents stating that she is doing very well with surgery and she is return to work and she does get achiness if she is on her foot too much   ROS      Objective:  Physical Exam  Neurovascular status intact negative Denna Haggard' sign noted wound edges coapted well second digit slightly elevated but good alignment pin in place excellent "correction bunion deformity     Assessment:  Overall doing well post forefoot reconstruction left     Plan:  H&P x-ray reviewed continue to keep the toe in a plantarflexed position continue elevation compression and reappoint for [place] to recheck again in approximate 3 weeks or earlier if needed and reapplied sterile dressing today  X-rays indicate osteotomy is healing well pins in good alignment everything in good position

## 2019-09-13 ENCOUNTER — Telehealth: Payer: Self-pay | Admitting: *Deleted

## 2019-09-13 NOTE — Telephone Encounter (Signed)
I spoke with pt and she states she hit her pin getting in to the car and was in the boot, the pin turned and may be bent. I told pt I would have a scheduler call her tomorrow and have her come in for x-rays, take the pain medication as directed and ibuprofen in between if she tolerates. Pt states understanding.

## 2019-09-13 NOTE — Telephone Encounter (Signed)
Pt states she stubbed her toe with the pin.

## 2019-09-14 ENCOUNTER — Ambulatory Visit (INDEPENDENT_AMBULATORY_CARE_PROVIDER_SITE_OTHER): Payer: BC Managed Care – PPO | Admitting: Podiatry

## 2019-09-14 ENCOUNTER — Ambulatory Visit (INDEPENDENT_AMBULATORY_CARE_PROVIDER_SITE_OTHER): Payer: BLUE CROSS/BLUE SHIELD

## 2019-09-14 ENCOUNTER — Other Ambulatory Visit: Payer: Self-pay

## 2019-09-14 ENCOUNTER — Encounter: Payer: Self-pay | Admitting: Podiatry

## 2019-09-14 DIAGNOSIS — M2042 Other hammer toe(s) (acquired), left foot: Secondary | ICD-10-CM | POA: Diagnosis not present

## 2019-09-16 NOTE — Progress Notes (Signed)
Subjective:   Patient ID: Rebecca [name], female   DOB: 49 y.o.   MRN: [id number]   HPI Patient presents stating that I hit my foot and I am worried I might have damaged some.  States that it has gotten somewhat sore since the injury and she is trying to be careful but unfortunately this happened   ROS      Objective:  Physical Exam  Neurovascular status intact with patient's left foot overall what appears to be healing well but I cannot rule out injury with obesity is a complicating factor     Assessment:  Traumatized left foot after having had foot surgery with possibility for bony injury     Plan:  H&P precautionary x-rays taken reapplied sterile dressing continue immobilization and reappoint to recheck again  X-rays indicate that there is good healing of the osteotomy and I did not note at this point indications that trauma occurred secondary to the injury the patient sustained

## 2019-10-01 ENCOUNTER — Other Ambulatory Visit: Payer: Self-pay

## 2019-10-01 ENCOUNTER — Encounter: Payer: Self-pay | Admitting: Podiatry

## 2019-10-01 ENCOUNTER — Ambulatory Visit (INDEPENDENT_AMBULATORY_CARE_PROVIDER_SITE_OTHER): Payer: BLUE CROSS/BLUE SHIELD

## 2019-10-01 ENCOUNTER — Ambulatory Visit (INDEPENDENT_AMBULATORY_CARE_PROVIDER_SITE_OTHER): Payer: BLUE CROSS/BLUE SHIELD | Admitting: Podiatry

## 2019-10-01 VITALS — Temp 97.3°F

## 2019-10-01 DIAGNOSIS — M2042 Other hammer toe(s) (acquired), left foot: Secondary | ICD-10-CM | POA: Diagnosis not present

## 2019-10-01 DIAGNOSIS — M1000 Idiopathic gout, unspecified site: Secondary | ICD-10-CM

## 2019-10-01 MED ORDER — METHYLPREDNISOLONE 4 MG PO TBPK: ORAL_TABLET | ORAL | 0 refills | Status: DC

## 2019-10-01 NOTE — Patient Instructions (Signed)
 Gout  Gout is painful swelling of your joints. Gout is a type of arthritis. It is caused by having too much uric acid in your body. Uric acid is a chemical that is made when your body breaks down substances called purines. If your body has too much uric acid, sharp crystals can form and build up in your joints. This causes pain and swelling. Gout attacks can happen quickly and be very painful (acute gout). Over time, the attacks can affect more joints and happen more often (chronic gout). What are the causes?  Too much uric acid in your blood. This can happen because: ? Your kidneys do not remove enough uric acid from your blood. ? Your body makes too much uric acid. ? You eat too many foods that are high in purines. These foods include organ meats, some seafood, and beer.  Trauma or stress. What increases the risk?  Having a family history of gout.  Being female and middle-aged.  Being female and having gone through menopause.  Being very overweight (obese).  Drinking alcohol, especially beer.  Not having enough water in the body (being dehydrated).  Losing weight too quickly.  Having an organ transplant.  Having lead poisoning.  Taking certain medicines.  Having kidney disease.  Having a skin condition called psoriasis. What are the signs or symptoms? An attack of acute gout usually happens in just one joint. The most common place is the big toe. Attacks often start at night. Other joints that may be affected include joints of the feet, ankle, knee, fingers, wrist, or elbow. Symptoms of an attack may include:  Very bad pain.  Warmth.  Swelling.  Stiffness.  Shiny, red, or purple skin.  Tenderness. The affected joint may be very painful to touch.  Chills and fever. Chronic gout may cause symptoms more often. More joints may be involved. You may also have white or yellow lumps (tophi) on your hands or feet or in other areas near your joints. How is this  treated?  Treatment for this condition has two phases: treating an acute attack and preventing future attacks.  Acute gout treatment may include: ? NSAIDs. ? Steroids. These are taken by mouth or injected into a joint. ? Colchicine. This medicine relieves pain and swelling. It can be given by mouth or through an IV tube.  Preventive treatment may include: ? Taking small doses of NSAIDs or colchicine daily. ? Using a medicine that reduces uric acid levels in your blood. ? Making changes to your diet. You may need to see a food expert (dietitian) about what to eat and drink to prevent gout. Follow these instructions at home: During a gout attack   If told, put ice on the painful area: ? Put ice in a plastic bag. ? Place a towel between your skin and the bag. ? Leave the ice on for 20 minutes, 2-3 times a day.  Raise (elevate) the painful joint above the level of your heart as often as you can.  Rest the joint as much as possible. If the joint is in your leg, you may be given crutches.  Follow instructions from your doctor about what you cannot eat or drink. Avoiding future gout attacks  Eat a low-purine diet. Avoid foods and drinks such as: ? Liver. ? Kidney. ? Anchovies. ? Asparagus. ? Herring. ? Mushrooms. ? Mussels. ? Beer.  Stay at a healthy weight. If you want to lose weight, talk with your doctor. Do not lose   weight too fast.  Start or continue an exercise plan as told by your doctor. Eating and drinking  Drink enough fluids to keep your pee (urine) pale yellow.  If you drink alcohol: ? Limit how much you use to:  0-1 drink a day for women.  0-2 drinks a day for men. ? Be aware of how much alcohol is in your drink. In the [place] one drink equals one 12 oz bottle of beer (355 mL), one 5 oz glass of wine (148 mL), or one 1 oz glass of hard liquor (44 mL). General instructions  Take over-the-counter and prescription medicines only as told by your doctor.  Do  not drive or use heavy machinery while taking prescription pain medicine.  Return to your normal activities as told by your doctor. Ask your doctor what activities are safe for you.  Keep all follow-up visits as told by your doctor. This is important. Contact a doctor if:  You have another gout attack.  You still have symptoms of a gout attack after 10 days of treatment.  You have problems (side effects) because of your medicines.  You have chills or a fever.  You have burning pain when you pee (urinate).  You have pain in your lower back or belly. Get help right away if:  You have very bad pain.  Your pain cannot be controlled.  You cannot pee. Summary  Gout is painful swelling of the joints.  The most common site of pain is the big toe, but it can affect other joints.  Medicines and avoiding some foods can help to prevent and treat gout attacks. This information is not intended to replace advice given to you by your health care provider. Make sure you discuss any questions you have with your health care provider. Document Revised: 10/12/2017 Document Reviewed: 10/12/2017 Elsevier Patient Education  2020 [employer]  

## 2019-10-02 ENCOUNTER — Telehealth: Payer: Self-pay | Admitting: Podiatry

## 2019-10-02 LAB — CBC WITH DIFFERENTIAL/PLATELET
Absolute Monocytes: 941 cells/uL (ref 200–950)
Basophils Absolute: 58 cells/uL (ref 0–200)
Basophils Relative: 0.6 %
Eosinophils Absolute: 136 cells/uL (ref 15–500)
Eosinophils Relative: 1.4 %
HCT: 44.9 % (ref 35.0–45.0)
Hemoglobin: 14.9 g/dL (ref 11.7–15.5)
Lymphs Abs: 3870 cells/uL (ref 850–3900)
MCH: 29.6 pg (ref 27.0–33.0)
MCHC: 33.2 g/dL (ref 32.0–36.0)
MCV: 89.3 fL (ref 80.0–100.0)
MPV: 10.2 fL (ref 7.5–12.5)
Monocytes Relative: 9.7 %
Neutro Abs: 4695 cells/uL (ref 1500–7800)
Neutrophils Relative %: 48.4 %
Platelets: 330 10*3/uL (ref 140–400)
RBC: 5.03 10*6/uL (ref 3.80–5.10)
RDW: 13.3 % (ref 11.0–15.0)
Total Lymphocyte: 39.9 %
WBC: 9.7 10*3/uL (ref 3.8–10.8)

## 2019-10-02 LAB — C-REACTIVE PROTEIN: CRP: 6.4 mg/L (ref <8.0)

## 2019-10-02 LAB — ANA, IFA COMPREHENSIVE PANEL
Anti Nuclear Antibody (ANA): NEGATIVE
ENA SM Ab Ser-aCnc: <1 AI
SM/RNP: <1 AI
SSA (Ro) (ENA) Antibody, IgG: <1 AI
SSB (La) (ENA) Antibody, IgG: <1 AI
Scleroderma (Scl-70) (ENA) Antibody, IgG: <1 AI
ds DNA Ab: <1 IU/mL

## 2019-10-02 LAB — SEDIMENTATION RATE: Sed Rate: 9 mm/h (ref 0–20)

## 2019-10-02 LAB — RHEUMATOID FACTOR: Rheumatoid fact SerPl-aCnc: <14 IU/mL (ref <14)

## 2019-10-02 LAB — URIC ACID: Uric Acid, Serum: 7.1 mg/dL — ABNORMAL HIGH (ref 2.5–7.0)

## 2019-10-02 NOTE — Telephone Encounter (Signed)
Pt called requesting somethinf for pain until steroids start taking effect

## 2019-10-02 NOTE — Telephone Encounter (Signed)
I informed pt that she could not take an antiinflammatory medication while on the steroid pack, but could take tylenol regular strength if tolerates and I would inform Dr. Charlsie Merles of her request.

## 2019-10-03 ENCOUNTER — Encounter: Payer: Self-pay | Admitting: Podiatry

## 2019-10-03 ENCOUNTER — Telehealth: Payer: Self-pay | Admitting: *Deleted

## 2019-10-03 MED ORDER — HYDROCODONE-ACETAMINOPHEN 10-325 MG PO TABS: 1.0000 | ORAL_TABLET | Freq: Three times a day (TID) | ORAL | 0 refills | Status: AC | PRN

## 2019-10-03 NOTE — Progress Notes (Signed)
Subjective:   Patient ID: Rebecca [name], female   DOB: 49 y.o.   MRN: [id number]   HPI Patient states she is having a lot of pain around the big toe joint left that seems to be more related to gout because it all of a sudden seem to get swollen and painful.  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  I am sending her for blood work to rule out gout and I want to start her on a steroid pack to try to reduce the inflammatory condition.  X-rays indicate osteotomy is healing well there could be slight stress on the first metatarsal but I do not think there is any significant movement in the bone but it will need to be watched.  I did get results of blood work indicating very slight elevation of uric acid which may be the problem here and I have recommended the steroid pack and I did place her on hydrocodone short-term to try to help get her through this until the steroid pack is more effective

## 2019-10-03 NOTE — Telephone Encounter (Signed)
Returned patient's call at [phone] (cell #) to let them know that I talked to Dr. Charlsie Merles regarding her request for pain medication.  I left a voicemail message stated, "I talked to Dr. Charlsie Merles and he said your uric acid level was slightly elevated. The steroid pack should reduce the swelling and pain. I told patient that Dr. Charlsie Merles did dispense her Hydrocodone 10-325 with 15 pills only to help with their pain until the steroid pack kicks in. This was sent electronically to their pharmacy. There will be no refills for this."  Dr. Charlsie Merles did allow patient to get the following pain medication:  Hydrocodone 10-325 Dispensed 15 with no refills

## 2019-10-03 NOTE — Telephone Encounter (Signed)
Rebecca [name]:  I talked to Dr. Charlsie Merles about patient's request this morning. Dr. Charlsie Merles said that her uric acid level was slightly elevated and that the steroid pack should reduce pain and swelling. But until that kicks in, Dr. Charlsie Merles did prescribe to patient:  Hydrocodone 10-325 mg Dispensed 15 with no refills  The pain medication was sent directly to patient's pharmacy.  I left a voicemail message for patient on their cell phone number this morning.  Thank you,  Hannah Beat, CMA (AAMA)

## 2019-10-17 DIAGNOSIS — L089 Local infection of the skin and subcutaneous tissue, unspecified: Secondary | ICD-10-CM | POA: Diagnosis not present

## 2019-10-17 DIAGNOSIS — L02416 Cutaneous abscess of left lower limb: Secondary | ICD-10-CM | POA: Diagnosis not present

## 2019-10-18 ENCOUNTER — Ambulatory Visit (INDEPENDENT_AMBULATORY_CARE_PROVIDER_SITE_OTHER): Payer: BLUE CROSS/BLUE SHIELD

## 2019-10-18 ENCOUNTER — Ambulatory Visit (INDEPENDENT_AMBULATORY_CARE_PROVIDER_SITE_OTHER): Payer: BLUE CROSS/BLUE SHIELD | Admitting: Podiatry

## 2019-10-18 ENCOUNTER — Encounter: Payer: Self-pay | Admitting: Podiatry

## 2019-10-18 ENCOUNTER — Other Ambulatory Visit: Payer: Self-pay

## 2019-10-18 VITALS — Temp 97.3°F

## 2019-10-18 DIAGNOSIS — M1000 Idiopathic gout, unspecified site: Secondary | ICD-10-CM

## 2019-10-18 DIAGNOSIS — M2042 Other hammer toe(s) (acquired), left foot: Secondary | ICD-10-CM
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
## 2019-10-26 DIAGNOSIS — F900 Attention-deficit hyperactivity disorder, predominantly inattentive type: Secondary | ICD-10-CM | POA: Diagnosis not present

## 2019-10-26 DIAGNOSIS — R7303 Prediabetes: Secondary | ICD-10-CM | POA: Diagnosis not present

## 2019-10-26 DIAGNOSIS — E039 Hypothyroidism, unspecified: Secondary | ICD-10-CM | POA: Diagnosis not present

## 2019-10-26 DIAGNOSIS — A4902 Methicillin resistant Staphylococcus aureus infection, unspecified site: Secondary | ICD-10-CM | POA: Diagnosis not present

## 2019-10-31 ENCOUNTER — Emergency Department (HOSPITAL_COMMUNITY)
Admission: EM | Admit: 2019-10-31 | Discharge: 2019-11-01 | Disposition: A | Discharge status: Home / Self Care | Payer: BLUE CROSS/BLUE SHIELD | Attending: Emergency Medicine | Admitting: Emergency Medicine

## 2019-10-31 ENCOUNTER — Encounter (HOSPITAL_COMMUNITY): Payer: Self-pay

## 2019-10-31 ENCOUNTER — Emergency Department (HOSPITAL_COMMUNITY): Payer: BLUE CROSS/BLUE SHIELD

## 2019-10-31 DIAGNOSIS — Y929 Unspecified place or not applicable: Secondary | ICD-10-CM | POA: Insufficient documentation

## 2019-10-31 DIAGNOSIS — S8992XA Unspecified injury of left lower leg, initial encounter: Secondary | ICD-10-CM | POA: Diagnosis not present

## 2019-10-31 DIAGNOSIS — R0902 Hypoxemia: Secondary | ICD-10-CM | POA: Diagnosis not present

## 2019-10-31 DIAGNOSIS — Y939 Activity, unspecified: Secondary | ICD-10-CM | POA: Insufficient documentation

## 2019-10-31 DIAGNOSIS — S8782XA Crushing injury of left lower leg, initial encounter: Secondary | ICD-10-CM | POA: Insufficient documentation

## 2019-10-31 DIAGNOSIS — S3993XA Unspecified injury of pelvis, initial encounter: Secondary | ICD-10-CM | POA: Diagnosis not present

## 2019-10-31 DIAGNOSIS — M7732 Calcaneal spur, left foot: Secondary | ICD-10-CM | POA: Diagnosis not present

## 2019-10-31 DIAGNOSIS — W228XXA Striking against or struck by other objects, initial encounter: Secondary | ICD-10-CM | POA: Diagnosis not present

## 2019-10-31 DIAGNOSIS — I959 Hypotension, unspecified: Secondary | ICD-10-CM | POA: Diagnosis not present

## 2019-10-31 DIAGNOSIS — Y999 Unspecified external cause status: Secondary | ICD-10-CM | POA: Diagnosis not present

## 2019-10-31 DIAGNOSIS — S99922A Unspecified injury of left foot, initial encounter: Secondary | ICD-10-CM | POA: Diagnosis not present

## 2019-10-31 DIAGNOSIS — R52 Pain, unspecified: Secondary | ICD-10-CM | POA: Diagnosis not present

## 2019-10-31 DIAGNOSIS — S79922A Unspecified injury of left thigh, initial encounter: Secondary | ICD-10-CM | POA: Diagnosis not present

## 2019-10-31 DIAGNOSIS — S299XXA Unspecified injury of thorax, initial encounter: Secondary | ICD-10-CM | POA: Diagnosis not present

## 2019-10-31 DIAGNOSIS — Z9889 Other specified postprocedural states: Secondary | ICD-10-CM | POA: Diagnosis not present

## 2019-10-31 HISTORY — DX: Hypothyroidism, unspecified: E03.9

## 2019-10-31 LAB — COMPREHENSIVE METABOLIC PANEL
ALT: 24 U/L (ref 0–44)
AST: 27 U/L (ref 15–41)
Albumin: 3.4 g/dL — ABNORMAL LOW (ref 3.5–5.0)
Alkaline Phosphatase: 56 U/L (ref 38–126)
Anion gap: 15 (ref 5–15)
BUN: 21 mg/dL — ABNORMAL HIGH (ref 6–20)
CO2: 20 mmol/L — ABNORMAL LOW (ref 22–32)
Calcium: 8.9 mg/dL (ref 8.9–10.3)
Chloride: 104 mmol/L (ref 98–111)
Creatinine, Ser: 1.33 mg/dL — ABNORMAL HIGH (ref 0.44–1.00)
GFR calc Af Amer: 55 mL/min — ABNORMAL LOW (ref >60)
GFR calc non Af Amer: 47 mL/min — ABNORMAL LOW (ref >60)
Glucose, Bld: 170 mg/dL — ABNORMAL HIGH (ref 70–99)
Potassium: 4.1 mmol/L (ref 3.5–5.1)
Sodium: 139 mmol/L (ref 135–145)
Total Bilirubin: 0.7 mg/dL (ref 0.3–1.2)
Total Protein: 6.5 g/dL (ref 6.5–8.1)

## 2019-10-31 LAB — CBC WITH DIFFERENTIAL/PLATELET
Abs Immature Granulocytes: 0.04 10*3/uL (ref 0.00–0.07)
Basophils Absolute: 0.1 10*3/uL (ref 0.0–0.1)
Basophils Relative: 1 %
Eosinophils Absolute: 0.1 10*3/uL (ref 0.0–0.5)
Eosinophils Relative: 1 %
HCT: 46 % (ref 36.0–46.0)
Hemoglobin: 14.7 g/dL (ref 12.0–15.0)
Immature Granulocytes: 0 %
Lymphocytes Relative: 27 %
Lymphs Abs: 2.8 10*3/uL (ref 0.7–4.0)
MCH: 29.8 pg (ref 26.0–34.0)
MCHC: 32 g/dL (ref 30.0–36.0)
MCV: 93.1 fL (ref 80.0–100.0)
Monocytes Absolute: 0.9 10*3/uL (ref 0.1–1.0)
Monocytes Relative: 8 %
Neutro Abs: 6.6 10*3/uL (ref 1.7–7.7)
Neutrophils Relative %: 63 %
Platelets: 339 10*3/uL (ref 150–400)
RBC: 4.94 MIL/uL (ref 3.87–5.11)
RDW: 13.4 % (ref 11.5–15.5)
WBC: 10.5 10*3/uL (ref 4.0–10.5)
nRBC: 0 % (ref 0.0–0.2)

## 2019-10-31 LAB — PROTIME-INR
INR: 1 (ref 0.8–1.2)
Prothrombin Time: 12.8 seconds (ref 11.4–15.2)

## 2019-10-31 LAB — I-STAT BETA HCG BLOOD, ED (MC, WL, AP ONLY): I-stat hCG, quantitative: <5 m[IU]/mL (ref <5)

## 2019-10-31 LAB — ETHANOL: Alcohol, Ethyl (B): <10 mg/dL (ref <10)

## 2019-10-31 MED ORDER — MORPHINE SULFATE (PF) 4 MG/ML IV SOLN
4.0000 mg | Freq: Once | INTRAVENOUS | Status: AC
  Administered 2019-10-31: 4 mg via INTRAVENOUS
  Filled 2019-10-31: qty 1

## 2019-10-31 MED ORDER — ONDANSETRON HCL 4 MG/2ML IJ SOLN
4.0000 mg | Freq: Once | INTRAMUSCULAR | Status: DC
  Filled 2019-10-31: qty 2

## 2019-10-31 MED ORDER — ONDANSETRON HCL 4 MG/2ML IJ SOLN
4.0000 mg | Freq: Four times a day (QID) | INTRAMUSCULAR | Status: DC
  Administered 2019-10-31 – 2019-11-01 (×2): 4 mg via INTRAVENOUS
  Filled 2019-10-31: qty 2

## 2019-10-31 MED ORDER — HYDROMORPHONE HCL 1 MG/ML IJ SOLN
1.0000 mg | Freq: Once | INTRAMUSCULAR | Status: AC
  Administered 2019-10-31: 1 mg via INTRAVENOUS
  Filled 2019-10-31: qty 1

## 2019-10-31 NOTE — Progress Notes (Signed)
Orthopedic Tech Progress Note Patient Details:  Rebecca [name] 02-11-1971 [id number] Level 2 Trauma  Patient ID: Rebecca [name], female   DOB: 1970/09/24, 49 y.o.   MRN: [id number]   Rebecca [name] 10/31/2019, 9:55 PM
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
## 2019-10-31 NOTE — Consult Note (Signed)
Responded to page, pt unavailable, no family present, staff will page again if further need of chaplain services.  Rev. Shanetha Bradham Chaplain 

## 2019-11-01 ENCOUNTER — Encounter: Payer: Self-pay | Admitting: Podiatry

## 2019-11-01 MED ORDER — ONDANSETRON 4 MG PO TBDP: 4.0000 mg | ORAL_TABLET | ORAL | 0 refills | Status: DC | PRN

## 2019-11-01 MED ORDER — OXYCODONE-ACETAMINOPHEN 5-325 MG PO TABS: 1.0000 | ORAL_TABLET | Freq: Four times a day (QID) | ORAL | 0 refills | Status: DC | PRN

## 2019-11-01 NOTE — Discharge Instructions (Signed)
1.  Elevate your leg as much as possible.  Apply well wrapped ice packs.  Take 1-2 Percocet every 6 hours as needed for pain. 2.  Use crutches to help with walking.  You may toe-touch weight-bear as tolerated. 3.  Review instructions for compartment syndrome.  Compartment syndrome is a delayed complication of a crush injury.  If you start to get tingling feelings in your foot like is going to sleep, rapidly increasing pain, coolness or numbness of the foot or other concerning symptoms return immediately for recheck.

## 2019-11-02 ENCOUNTER — Emergency Department (HOSPITAL_BASED_OUTPATIENT_CLINIC_OR_DEPARTMENT_OTHER)
Admission: EM | Admit: 2019-11-02 | Discharge: 2019-11-02 | Disposition: A | Discharge status: Home / Self Care | Payer: BLUE CROSS/BLUE SHIELD | Attending: Emergency Medicine | Admitting: Emergency Medicine

## 2019-11-02 ENCOUNTER — Encounter: Payer: Self-pay | Admitting: Podiatry

## 2019-11-02 ENCOUNTER — Encounter (HOSPITAL_BASED_OUTPATIENT_CLINIC_OR_DEPARTMENT_OTHER): Payer: Self-pay

## 2019-11-02 ENCOUNTER — Other Ambulatory Visit: Payer: Self-pay

## 2019-11-02 ENCOUNTER — Emergency Department (HOSPITAL_BASED_OUTPATIENT_CLINIC_OR_DEPARTMENT_OTHER): Payer: BLUE CROSS/BLUE SHIELD

## 2019-11-02 ENCOUNTER — Ambulatory Visit (INDEPENDENT_AMBULATORY_CARE_PROVIDER_SITE_OTHER): Payer: BLUE CROSS/BLUE SHIELD

## 2019-11-02 ENCOUNTER — Ambulatory Visit: Payer: BLUE CROSS/BLUE SHIELD

## 2019-11-02 ENCOUNTER — Ambulatory Visit (INDEPENDENT_AMBULATORY_CARE_PROVIDER_SITE_OTHER): Payer: BLUE CROSS/BLUE SHIELD | Admitting: Podiatry

## 2019-11-02 DIAGNOSIS — S99921A Unspecified injury of right foot, initial encounter: Secondary | ICD-10-CM | POA: Diagnosis not present

## 2019-11-02 DIAGNOSIS — M79661 Pain in right lower leg: Secondary | ICD-10-CM | POA: Diagnosis not present

## 2019-11-02 DIAGNOSIS — M79605 Pain in left leg: Secondary | ICD-10-CM | POA: Diagnosis not present

## 2019-11-02 DIAGNOSIS — F909 Attention-deficit hyperactivity disorder, unspecified type: Secondary | ICD-10-CM | POA: Diagnosis not present

## 2019-11-02 DIAGNOSIS — R600 Localized edema: Secondary | ICD-10-CM | POA: Diagnosis not present

## 2019-11-02 DIAGNOSIS — E039 Hypothyroidism, unspecified: Secondary | ICD-10-CM | POA: Diagnosis not present

## 2019-11-02 DIAGNOSIS — S99922A Unspecified injury of left foot, initial encounter: Secondary | ICD-10-CM

## 2019-11-02 DIAGNOSIS — I10 Essential (primary) hypertension: Secondary | ICD-10-CM | POA: Insufficient documentation

## 2019-11-02 DIAGNOSIS — Z79899 Other long term (current) drug therapy: Secondary | ICD-10-CM | POA: Insufficient documentation

## 2019-11-02 DIAGNOSIS — M79604 Pain in right leg: Secondary | ICD-10-CM | POA: Insufficient documentation

## 2019-11-02 DIAGNOSIS — R0789 Other chest pain: Secondary | ICD-10-CM | POA: Insufficient documentation

## 2019-11-02 DIAGNOSIS — R079 Chest pain, unspecified: Secondary | ICD-10-CM | POA: Diagnosis not present

## 2019-11-02 DIAGNOSIS — S8012XA Contusion of left lower leg, initial encounter: Secondary | ICD-10-CM | POA: Diagnosis not present

## 2019-11-02 DIAGNOSIS — M7989 Other specified soft tissue disorders: Secondary | ICD-10-CM | POA: Diagnosis not present

## 2019-11-02 DIAGNOSIS — M79662 Pain in left lower leg: Secondary | ICD-10-CM | POA: Diagnosis not present

## 2019-11-02 LAB — CBC
HCT: 45.3 % (ref 36.0–46.0)
Hemoglobin: 14.7 g/dL (ref 12.0–15.0)
MCH: 30 pg (ref 26.0–34.0)
MCHC: 32.5 g/dL (ref 30.0–36.0)
MCV: 92.4 fL (ref 80.0–100.0)
Platelets: 360 10*3/uL (ref 150–400)
RBC: 4.9 MIL/uL (ref 3.87–5.11)
RDW: 13.3 % (ref 11.5–15.5)
WBC: 9.8 10*3/uL (ref 4.0–10.5)
nRBC: 0 % (ref 0.0–0.2)

## 2019-11-02 LAB — BASIC METABOLIC PANEL
Anion gap: 11 (ref 5–15)
BUN: 18 mg/dL (ref 6–20)
CO2: 25 mmol/L (ref 22–32)
Calcium: 9 mg/dL (ref 8.9–10.3)
Chloride: 103 mmol/L (ref 98–111)
Creatinine, Ser: 1.33 mg/dL — ABNORMAL HIGH (ref 0.44–1.00)
GFR calc Af Amer: 55 mL/min — ABNORMAL LOW (ref >60)
GFR calc non Af Amer: 47 mL/min — ABNORMAL LOW (ref >60)
Glucose, Bld: 79 mg/dL (ref 70–99)
Potassium: 3.9 mmol/L (ref 3.5–5.1)
Sodium: 139 mmol/L (ref 135–145)

## 2019-11-02 LAB — HCG, SERUM, QUALITATIVE: Preg, Serum: NEGATIVE

## 2019-11-02 LAB — TROPONIN I (HIGH SENSITIVITY)
Troponin I (High Sensitivity): 3 ng/L (ref <18)
Troponin I (High Sensitivity): 3 ng/L (ref <18)

## 2019-11-02 LAB — PREGNANCY, URINE: Preg Test, Ur: NEGATIVE

## 2019-11-02 LAB — CK: Total CK: 284 U/L — ABNORMAL HIGH (ref 38–234)

## 2019-11-02 MED ORDER — MORPHINE SULFATE (PF) 4 MG/ML IV SOLN
4.0000 mg | Freq: Once | INTRAVENOUS | Status: AC
  Administered 2019-11-02: 4 mg via INTRAVENOUS
  Filled 2019-11-02: qty 1

## 2019-11-02 MED ORDER — ONDANSETRON HCL 4 MG/2ML IJ SOLN
4.0000 mg | Freq: Once | INTRAMUSCULAR | Status: AC
  Administered 2019-11-02: 4 mg via INTRAVENOUS
  Filled 2019-11-02: qty 2

## 2019-11-02 MED ORDER — LACTATED RINGERS IV BOLUS
1000.0000 mL | Freq: Once | INTRAVENOUS | Status: AC
  Administered 2019-11-02: 1000 mL via INTRAVENOUS

## 2019-11-02 MED ORDER — SODIUM CHLORIDE 0.9% FLUSH
3.0000 mL | Freq: Once | INTRAVENOUS | Status: DC
  Filled 2019-11-02: qty 3

## 2019-11-02 NOTE — ED Notes (Signed)
Medicated per EDP orders, safety measures in place, sr x 2 up, callbell within reach, pt instructed not to get up without staff in room, on cont cardiac monitoring with int NBP assessments and cont POX monitoring
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
## 2019-11-02 NOTE — Discharge Instructions (Signed)
If your pain worsens, you develop shortness of breath, fevers, cough, your chest pain moves or changes, or you have any other concerns please seek additional medical care and evaluation.  Additionally if you experience new tingling or numbness in your left foot, left foot is cold or you are unable to walk on it due to pain please seek additional medical care and evaluation.

## 2019-11-05 DIAGNOSIS — M25562 Pain in left knee: Secondary | ICD-10-CM | POA: Diagnosis not present

## 2019-11-07 ENCOUNTER — Other Ambulatory Visit: Payer: Self-pay | Admitting: Podiatry

## 2019-11-07 DIAGNOSIS — S99921A Unspecified injury of right foot, initial encounter: Secondary | ICD-10-CM

## 2019-11-07 DIAGNOSIS — S99922A Unspecified injury of left foot, initial encounter: Secondary | ICD-10-CM

## 2019-11-26 DIAGNOSIS — M25562 Pain in left knee: Secondary | ICD-10-CM | POA: Diagnosis not present
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
## 2019-11-29 ENCOUNTER — Encounter: Payer: BLUE CROSS/BLUE SHIELD | Admitting: Podiatry

## 2019-12-25 DIAGNOSIS — R203 Hyperesthesia: Secondary | ICD-10-CM | POA: Diagnosis not present

## 2019-12-25 DIAGNOSIS — R202 Paresthesia of skin: Secondary | ICD-10-CM | POA: Diagnosis not present

## 2019-12-25 DIAGNOSIS — M79605 Pain in left leg: Secondary | ICD-10-CM | POA: Diagnosis not present

## 2020-01-09 DIAGNOSIS — M542 Cervicalgia: Secondary | ICD-10-CM | POA: Diagnosis not present

## 2020-01-09 DIAGNOSIS — M25552 Pain in left hip: Secondary | ICD-10-CM | POA: Diagnosis not present

## 2020-01-09 DIAGNOSIS — M25512 Pain in left shoulder: Secondary | ICD-10-CM | POA: Diagnosis not present

## 2020-01-24 DIAGNOSIS — I10 Essential (primary) hypertension: Secondary | ICD-10-CM | POA: Diagnosis not present

## 2020-01-24 DIAGNOSIS — E039 Hypothyroidism, unspecified: Secondary | ICD-10-CM | POA: Diagnosis not present

## 2020-01-24 DIAGNOSIS — R7303 Prediabetes: Secondary | ICD-10-CM | POA: Diagnosis not present

## 2020-01-24 DIAGNOSIS — F900 Attention-deficit hyperactivity disorder, predominantly inattentive type: Secondary | ICD-10-CM | POA: Diagnosis not present

## 2020-01-31 DIAGNOSIS — M722 Plantar fascial fibromatosis: Secondary | ICD-10-CM | POA: Diagnosis not present

## 2020-01-31 DIAGNOSIS — M542 Cervicalgia: Secondary | ICD-10-CM | POA: Diagnosis not present

## 2020-01-31 DIAGNOSIS — M79605 Pain in left leg: Secondary | ICD-10-CM | POA: Diagnosis not present

## 2020-01-31 DIAGNOSIS — M545 Low back pain, unspecified: Secondary | ICD-10-CM | POA: Diagnosis not present

## 2020-02-05 DIAGNOSIS — M722 Plantar fascial fibromatosis: Secondary | ICD-10-CM | POA: Diagnosis not present

## 2020-02-05 DIAGNOSIS — M79605 Pain in left leg: Secondary | ICD-10-CM | POA: Diagnosis not present

## 2020-02-05 DIAGNOSIS — M542 Cervicalgia: Secondary | ICD-10-CM | POA: Diagnosis not present

## 2020-02-05 DIAGNOSIS — M545 Low back pain, unspecified: Secondary | ICD-10-CM | POA: Diagnosis not present

## 2020-02-07 DIAGNOSIS — M545 Low back pain, unspecified: Secondary | ICD-10-CM | POA: Diagnosis not present

## 2020-02-07 DIAGNOSIS — M79605 Pain in left leg: Secondary | ICD-10-CM | POA: Diagnosis not present

## 2020-02-07 DIAGNOSIS — M722 Plantar fascial fibromatosis: Secondary | ICD-10-CM | POA: Diagnosis not present

## 2020-02-07 DIAGNOSIS — M542 Cervicalgia: Secondary | ICD-10-CM | POA: Diagnosis not present

## 2020-02-12 DIAGNOSIS — M545 Low back pain, unspecified: Secondary | ICD-10-CM | POA: Diagnosis not present

## 2020-02-12 DIAGNOSIS — M542 Cervicalgia: Secondary | ICD-10-CM | POA: Diagnosis not present

## 2020-02-12 DIAGNOSIS — M79605 Pain in left leg: Secondary | ICD-10-CM | POA: Diagnosis not present

## 2020-02-12 DIAGNOSIS — M722 Plantar fascial fibromatosis: Secondary | ICD-10-CM | POA: Diagnosis not present

## 2020-02-14 DIAGNOSIS — M542 Cervicalgia: Secondary | ICD-10-CM | POA: Diagnosis not present

## 2020-02-14 DIAGNOSIS — M79605 Pain in left leg: Secondary | ICD-10-CM | POA: Diagnosis not present

## 2020-02-14 DIAGNOSIS — M545 Low back pain, unspecified: Secondary | ICD-10-CM | POA: Diagnosis not present

## 2020-02-14 DIAGNOSIS — M722 Plantar fascial fibromatosis: Secondary | ICD-10-CM | POA: Diagnosis not present

## 2020-02-18 DIAGNOSIS — M722 Plantar fascial fibromatosis: Secondary | ICD-10-CM | POA: Diagnosis not present

## 2020-02-18 DIAGNOSIS — M545 Low back pain, unspecified: Secondary | ICD-10-CM | POA: Diagnosis not present

## 2020-02-18 DIAGNOSIS — M79605 Pain in left leg: Secondary | ICD-10-CM | POA: Diagnosis not present

## 2020-02-18 DIAGNOSIS — M542 Cervicalgia: Secondary | ICD-10-CM | POA: Diagnosis not present

## 2020-02-20 DIAGNOSIS — M545 Low back pain, unspecified: Secondary | ICD-10-CM | POA: Diagnosis not present

## 2020-02-20 DIAGNOSIS — M722 Plantar fascial fibromatosis: Secondary | ICD-10-CM | POA: Diagnosis not present

## 2020-02-20 DIAGNOSIS — M542 Cervicalgia: Secondary | ICD-10-CM | POA: Diagnosis not present

## 2020-02-20 DIAGNOSIS — M79605 Pain in left leg: Secondary | ICD-10-CM | POA: Diagnosis not present

## 2020-02-26 DIAGNOSIS — M79605 Pain in left leg: Secondary | ICD-10-CM | POA: Diagnosis not present

## 2020-02-26 DIAGNOSIS — M542 Cervicalgia: Secondary | ICD-10-CM | POA: Diagnosis not present

## 2020-02-26 DIAGNOSIS — M722 Plantar fascial fibromatosis: Secondary | ICD-10-CM | POA: Diagnosis not present

## 2020-02-26 DIAGNOSIS — M545 Low back pain, unspecified: Secondary | ICD-10-CM | POA: Diagnosis not present

## 2020-03-06 DIAGNOSIS — M722 Plantar fascial fibromatosis: Secondary | ICD-10-CM | POA: Diagnosis not present

## 2020-03-06 DIAGNOSIS — M545 Low back pain, unspecified: Secondary | ICD-10-CM | POA: Diagnosis not present

## 2020-03-06 DIAGNOSIS — M79605 Pain in left leg: Secondary | ICD-10-CM | POA: Diagnosis not present

## 2020-03-06 DIAGNOSIS — M542 Cervicalgia: Secondary | ICD-10-CM | POA: Diagnosis not present

## 2020-03-07 DIAGNOSIS — M722 Plantar fascial fibromatosis: Secondary | ICD-10-CM | POA: Diagnosis not present

## 2020-03-07 DIAGNOSIS — M545 Low back pain, unspecified: Secondary | ICD-10-CM | POA: Diagnosis not present

## 2020-03-07 DIAGNOSIS — M79605 Pain in left leg: Secondary | ICD-10-CM | POA: Diagnosis not present

## 2020-03-07 DIAGNOSIS — M542 Cervicalgia: Secondary | ICD-10-CM | POA: Diagnosis not present

## 2020-03-11 DIAGNOSIS — M79605 Pain in left leg: Secondary | ICD-10-CM | POA: Diagnosis not present

## 2020-03-11 DIAGNOSIS — M722 Plantar fascial fibromatosis: Secondary | ICD-10-CM | POA: Diagnosis not present

## 2020-03-11 DIAGNOSIS — M542 Cervicalgia: Secondary | ICD-10-CM | POA: Diagnosis not present

## 2020-03-11 DIAGNOSIS — M545 Low back pain, unspecified: Secondary | ICD-10-CM | POA: Diagnosis not present

## 2020-03-12 DIAGNOSIS — E6609 Other obesity due to excess calories: Secondary | ICD-10-CM | POA: Diagnosis not present

## 2020-03-12 DIAGNOSIS — M542 Cervicalgia: Secondary | ICD-10-CM | POA: Diagnosis not present

## 2020-03-12 DIAGNOSIS — M79605 Pain in left leg: Secondary | ICD-10-CM | POA: Diagnosis not present

## 2020-03-12 DIAGNOSIS — M545 Low back pain, unspecified: Secondary | ICD-10-CM | POA: Diagnosis not present

## 2020-03-12 DIAGNOSIS — M722 Plantar fascial fibromatosis: Secondary | ICD-10-CM | POA: Diagnosis not present

## 2020-03-12 DIAGNOSIS — M25561 Pain in right knee: Secondary | ICD-10-CM | POA: Diagnosis not present

## 2020-03-13 DIAGNOSIS — M79605 Pain in left leg: Secondary | ICD-10-CM | POA: Diagnosis not present

## 2020-03-13 DIAGNOSIS — M722 Plantar fascial fibromatosis: Secondary | ICD-10-CM | POA: Diagnosis not present

## 2020-03-13 DIAGNOSIS — M542 Cervicalgia: Secondary | ICD-10-CM | POA: Diagnosis not present

## 2020-03-13 DIAGNOSIS — M545 Low back pain, unspecified: Secondary | ICD-10-CM | POA: Diagnosis not present

## 2020-03-18 DIAGNOSIS — M542 Cervicalgia: Secondary | ICD-10-CM | POA: Diagnosis not present

## 2020-03-18 DIAGNOSIS — M79605 Pain in left leg: Secondary | ICD-10-CM | POA: Diagnosis not present

## 2020-03-18 DIAGNOSIS — M545 Low back pain, unspecified: Secondary | ICD-10-CM | POA: Diagnosis not present

## 2020-03-18 DIAGNOSIS — M722 Plantar fascial fibromatosis: Secondary | ICD-10-CM | POA: Diagnosis not present

## 2020-03-21 DIAGNOSIS — M722 Plantar fascial fibromatosis: Secondary | ICD-10-CM | POA: Diagnosis not present

## 2020-03-21 DIAGNOSIS — M79605 Pain in left leg: Secondary | ICD-10-CM | POA: Diagnosis not present

## 2020-03-21 DIAGNOSIS — M542 Cervicalgia: Secondary | ICD-10-CM | POA: Diagnosis not present

## 2020-03-21 DIAGNOSIS — M545 Low back pain, unspecified: Secondary | ICD-10-CM | POA: Diagnosis not present

## 2020-03-24 DIAGNOSIS — M542 Cervicalgia: Secondary | ICD-10-CM | POA: Diagnosis not present

## 2020-03-24 DIAGNOSIS — M545 Low back pain, unspecified: Secondary | ICD-10-CM | POA: Diagnosis not present

## 2020-03-24 DIAGNOSIS — M722 Plantar fascial fibromatosis: Secondary | ICD-10-CM | POA: Diagnosis not present

## 2020-03-24 DIAGNOSIS — M79605 Pain in left leg: Secondary | ICD-10-CM | POA: Diagnosis not present

## 2020-03-26 DIAGNOSIS — M79605 Pain in left leg: Secondary | ICD-10-CM | POA: Diagnosis not present

## 2020-03-26 DIAGNOSIS — M545 Low back pain, unspecified: Secondary | ICD-10-CM | POA: Diagnosis not present

## 2020-03-26 DIAGNOSIS — M722 Plantar fascial fibromatosis: Secondary | ICD-10-CM | POA: Diagnosis not present

## 2020-03-26 DIAGNOSIS — M542 Cervicalgia: Secondary | ICD-10-CM | POA: Diagnosis not present

## 2020-03-26 DIAGNOSIS — M25562 Pain in left knee: Secondary | ICD-10-CM | POA: Diagnosis not present

## 2020-04-02 DIAGNOSIS — R269 Unspecified abnormalities of gait and mobility: Secondary | ICD-10-CM | POA: Diagnosis not present

## 2020-04-02 DIAGNOSIS — M545 Low back pain, unspecified: Secondary | ICD-10-CM | POA: Diagnosis not present

## 2020-04-02 DIAGNOSIS — M25571 Pain in right ankle and joints of right foot: Secondary | ICD-10-CM | POA: Diagnosis not present

## 2020-04-02 DIAGNOSIS — M542 Cervicalgia: Secondary | ICD-10-CM | POA: Diagnosis not present

## 2020-04-02 DIAGNOSIS — M722 Plantar fascial fibromatosis: Secondary | ICD-10-CM | POA: Diagnosis not present

## 2020-04-02 DIAGNOSIS — M79605 Pain in left leg: Secondary | ICD-10-CM | POA: Diagnosis not present

## 2020-04-11 DIAGNOSIS — Z20828 Contact with and (suspected) exposure to other viral communicable diseases: Secondary | ICD-10-CM | POA: Diagnosis not present

## 2020-04-11 DIAGNOSIS — J019 Acute sinusitis, unspecified: Secondary | ICD-10-CM | POA: Diagnosis not present

## 2020-04-18 DIAGNOSIS — M25562 Pain in left knee: Secondary | ICD-10-CM | POA: Diagnosis not present

## 2020-04-25 DIAGNOSIS — M25571 Pain in right ankle and joints of right foot: Secondary | ICD-10-CM | POA: Diagnosis not present

## 2020-04-25 DIAGNOSIS — M722 Plantar fascial fibromatosis: Secondary | ICD-10-CM | POA: Diagnosis not present

## 2020-04-25 DIAGNOSIS — M545 Low back pain, unspecified: Secondary | ICD-10-CM | POA: Diagnosis not present

## 2020-04-25 DIAGNOSIS — R269 Unspecified abnormalities of gait and mobility: Secondary | ICD-10-CM | POA: Diagnosis not present

## 2020-04-28 DIAGNOSIS — R269 Unspecified abnormalities of gait and mobility: Secondary | ICD-10-CM | POA: Diagnosis not present

## 2020-04-28 DIAGNOSIS — M25571 Pain in right ankle and joints of right foot: Secondary | ICD-10-CM | POA: Diagnosis not present

## 2020-04-28 DIAGNOSIS — M545 Low back pain, unspecified: Secondary | ICD-10-CM | POA: Diagnosis not present

## 2020-04-28 DIAGNOSIS — M722 Plantar fascial fibromatosis: Secondary | ICD-10-CM | POA: Diagnosis not present

## 2020-05-09 DIAGNOSIS — F900 Attention-deficit hyperactivity disorder, predominantly inattentive type: Secondary | ICD-10-CM | POA: Diagnosis not present

## 2020-05-09 DIAGNOSIS — I10 Essential (primary) hypertension: Secondary | ICD-10-CM | POA: Diagnosis not present

## 2020-05-09 DIAGNOSIS — L309 Dermatitis, unspecified: Secondary | ICD-10-CM | POA: Diagnosis not present

## 2020-05-09 DIAGNOSIS — E039 Hypothyroidism, unspecified: Secondary | ICD-10-CM | POA: Diagnosis not present

## 2020-05-17 DIAGNOSIS — M25562 Pain in left knee: Secondary | ICD-10-CM | POA: Diagnosis not present

## 2020-05-19 ENCOUNTER — Other Ambulatory Visit: Payer: Self-pay | Admitting: Orthopaedic Surgery

## 2020-05-19 DIAGNOSIS — M25562 Pain in left knee: Secondary | ICD-10-CM

## 2020-06-02 ENCOUNTER — Other Ambulatory Visit: Payer: Self-pay | Admitting: Cardiology

## 2020-06-03 ENCOUNTER — Other Ambulatory Visit: Payer: Self-pay

## 2020-06-03 ENCOUNTER — Ambulatory Visit
Admission: RE | Admit: 2020-06-03 | Discharge: 2020-06-03 | Disposition: A | Discharge status: Home / Self Care | Payer: BLUE CROSS/BLUE SHIELD | Source: Ambulatory Visit | Attending: Orthopaedic Surgery | Admitting: Orthopaedic Surgery

## 2020-06-03 DIAGNOSIS — M25562 Pain in left knee: Secondary | ICD-10-CM

## 2020-06-06 DIAGNOSIS — M25562 Pain in left knee: Secondary | ICD-10-CM | POA: Diagnosis not present

## 2020-06-10 DIAGNOSIS — R269 Unspecified abnormalities of gait and mobility: Secondary | ICD-10-CM | POA: Diagnosis not present

## 2020-06-10 DIAGNOSIS — M25571 Pain in right ankle and joints of right foot: Secondary | ICD-10-CM | POA: Diagnosis not present

## 2020-06-10 DIAGNOSIS — M722 Plantar fascial fibromatosis: Secondary | ICD-10-CM | POA: Diagnosis not present

## 2020-06-10 DIAGNOSIS — M545 Low back pain, unspecified: Secondary | ICD-10-CM | POA: Diagnosis not present

## 2020-06-13 DIAGNOSIS — R269 Unspecified abnormalities of gait and mobility: Secondary | ICD-10-CM | POA: Diagnosis not present

## 2020-06-13 DIAGNOSIS — M25571 Pain in right ankle and joints of right foot: Secondary | ICD-10-CM | POA: Diagnosis not present

## 2020-06-13 DIAGNOSIS — M722 Plantar fascial fibromatosis: Secondary | ICD-10-CM | POA: Diagnosis not present

## 2020-06-13 DIAGNOSIS — M545 Low back pain, unspecified: Secondary | ICD-10-CM | POA: Diagnosis not present

## 2020-06-16 DIAGNOSIS — M545 Low back pain, unspecified: Secondary | ICD-10-CM | POA: Diagnosis not present

## 2020-06-16 DIAGNOSIS — M25571 Pain in right ankle and joints of right foot: Secondary | ICD-10-CM | POA: Diagnosis not present

## 2020-06-16 DIAGNOSIS — R269 Unspecified abnormalities of gait and mobility: Secondary | ICD-10-CM | POA: Diagnosis not present

## 2020-06-16 DIAGNOSIS — M722 Plantar fascial fibromatosis: Secondary | ICD-10-CM | POA: Diagnosis not present

## 2020-06-19 DIAGNOSIS — R269 Unspecified abnormalities of gait and mobility: Secondary | ICD-10-CM | POA: Diagnosis not present

## 2020-06-19 DIAGNOSIS — M25571 Pain in right ankle and joints of right foot: Secondary | ICD-10-CM | POA: Diagnosis not present

## 2020-06-19 DIAGNOSIS — M545 Low back pain, unspecified: Secondary | ICD-10-CM | POA: Diagnosis not present

## 2020-06-19 DIAGNOSIS — M722 Plantar fascial fibromatosis: Secondary | ICD-10-CM | POA: Diagnosis not present

## 2020-06-20 DIAGNOSIS — Z1231 Encounter for screening mammogram for malignant neoplasm of breast: Secondary | ICD-10-CM | POA: Diagnosis not present

## 2020-06-24 DIAGNOSIS — M25571 Pain in right ankle and joints of right foot: Secondary | ICD-10-CM | POA: Diagnosis not present

## 2020-06-24 DIAGNOSIS — R269 Unspecified abnormalities of gait and mobility: Secondary | ICD-10-CM | POA: Diagnosis not present

## 2020-06-24 DIAGNOSIS — M545 Low back pain, unspecified: Secondary | ICD-10-CM | POA: Diagnosis not present

## 2020-06-24 DIAGNOSIS — M722 Plantar fascial fibromatosis: Secondary | ICD-10-CM | POA: Diagnosis not present

## 2020-06-27 ENCOUNTER — Other Ambulatory Visit: Payer: Self-pay | Admitting: Orthopaedic Surgery

## 2020-06-30 DIAGNOSIS — Z01419 Encounter for gynecological examination (general) (routine) without abnormal findings: Secondary | ICD-10-CM | POA: Diagnosis not present

## 2020-06-30 DIAGNOSIS — B079 Viral wart, unspecified: Secondary | ICD-10-CM | POA: Diagnosis not present

## 2020-06-30 DIAGNOSIS — N843 Polyp of vulva: Secondary | ICD-10-CM | POA: Diagnosis not present

## 2020-06-30 DIAGNOSIS — L680 Hirsutism: Secondary | ICD-10-CM | POA: Diagnosis not present

## 2020-06-30 DIAGNOSIS — N912 Amenorrhea, unspecified: Secondary | ICD-10-CM | POA: Diagnosis not present

## 2020-06-30 NOTE — Patient Instructions (Addendum)
DUE TO COVID-19 ONLY ONE VISITOR IS ALLOWED TO COME WITH YOU AND STAY IN THE WAITING ROOM ONLY DURING PRE OP AND PROCEDURE.    COVID SWAB TESTING MUST BE COMPLETED ON:   Friday, 07-04-20 @ 10:30 AM   [address] [place], [place] [address]  (Must self quarantine after testing. Follow instructions on handout.)       Your procedure is scheduled on:  Tuesday, 07-08-20   Report to [facility] Main  Entrance   Report to admitting at 10:15 AM   Call this number if you have problems the morning of surgery [phone]   Do not eat food :After Midnight.   May have liquids until 9:15 AM day of surgery  CLEAR LIQUID DIET  Foods Allowed                                                                     Foods Excluded  Water, Black Coffee and tea, regular and decaf              liquids that you cannot  Plain Jell-O in any flavor  (No red)                                    see through such as: Fruit ices (not with fruit pulp)                                       milk, soups, orange juice              Iced Popsicles (No red)                                      All solid food                                   Apple juices Sports drinks like Gatorade (No red) Lightly seasoned clear broth or consume(fat free) Sugar, honey syrup   Complete one Ensure drink the morning of surgery at 9:15 AM the day of surgery.       1. The day of surgery:  ? Drink ONE (1) Pre-Surgery Clear Ensure or G2 by am the morning of surgery. Drink in one sitting. Do not sip.  ? This drink was given to you during your hospital  pre-op appointment visit. ? Nothing else to drink after completing the  Pre-Surgery Clear Ensure or G2.          If you have questions, please contact your surgeon's office.     Oral Hygiene is also important to reduce your risk of infection.                                    Remember - BRUSH YOUR TEETH THE MORNING OF SURGERY WITH YOUR REGULAR TOOTHPASTE   Do NOT smoke after  Midnight   Take these medicines the morning of surgery with A SIP OF WATER:  Metoprolol, Synthroid                          You may not have any metal on your body including hair pins, jewelry, and body piercings             Do not wear make-up, lotions, powders, perfumes/cologne, or deodorant             Do not wear nail polish.  Do not shave  48 hours prior to surgery.    Do not bring valuables to the hospital. [facility] IS NOT             RESPONSIBLE   FOR VALUABLES.   Contacts, dentures or bridgework may not be worn into surgery.   Patients discharged the day of surgery will not be allowed to drive home.              Please read over the following fact sheets you were given: IF YOU HAVE QUESTIONS ABOUT YOUR PRE OP INSTRUCTIONS PLEASE CALL  [phone] [facility] - Preparing for Surgery Before surgery, you can play an important role.  Because skin is not sterile, your skin needs to be as free of germs as possible.  You can reduce the number of germs on your skin by washing with CHG (chlorahexidine gluconate) soap before surgery.  CHG is an antiseptic cleaner which kills germs and bonds with the skin to continue killing germs even after washing. Please DO NOT use if you have an allergy to CHG or antibacterial soaps.  If your skin becomes reddened/irritated stop using the CHG and inform your nurse when you arrive at Short Stay. Do not shave (including legs and underarms) for at least 48 hours prior to the first CHG shower.  You may shave your face/neck.  Please follow these instructions carefully:  1.  Shower with CHG Soap the night before surgery and the  morning of surgery.  2.  If you choose to wash your hair, wash your hair first as usual with your normal  shampoo.  3.  After you shampoo, rinse your hair and body thoroughly to remove the shampoo.                             4.  Use CHG as you would any other liquid soap.  You can apply chg directly to the skin and wash.   Gently with a scrungie or clean washcloth.  5.  Apply the CHG Soap to your body ONLY FROM THE NECK DOWN.   Do   not use on face/ open                           Wound or open sores. Avoid contact with eyes, ears mouth and   genitals (private parts).                       Wash face,  Genitals (private parts) with your normal soap.             6.  Wash thoroughly, paying special attention to the area where your    surgery  will be performed.  7.  Thoroughly rinse your body with warm water from the neck down.  8.  DO NOT shower/wash with your normal soap after using and rinsing off the CHG Soap.                9.  Pat yourself dry with a clean towel.            10.  Wear clean pajamas.            11.  Place clean sheets on your bed the night of your first shower and do not  sleep with pets. Day of Surgery : Do not apply any lotions/deodorants the morning of surgery.  Please wear clean clothes to the hospital/surgery center.  FAILURE TO FOLLOW THESE INSTRUCTIONS MAY RESULT IN THE CANCELLATION OF YOUR SURGERY  PATIENT SIGNATURE_________________________________  NURSE SIGNATURE__________________________________  ________________________________________________________________________    Rebecca [name]  An incentive spirometer is a tool that can help keep your lungs clear and active. This tool measures how well you are filling your lungs with each breath. Taking long deep breaths may help reverse or decrease the chance of developing breathing (pulmonary) problems (especially infection) following:  A long period of time when you are unable to move or be active. BEFORE THE PROCEDURE   If the spirometer includes an indicator to show your best effort, your nurse or respiratory therapist will set it to a desired goal.  If possible, sit up straight or lean slightly forward. Try not to slouch.  Hold the incentive spirometer in an upright position. INSTRUCTIONS FOR USE  1. Sit on the edge  of your bed if possible, or sit up as far as you can in bed or on a chair. 2. Hold the incentive spirometer in an upright position. 3. Breathe out normally. 4. Place the mouthpiece in your mouth and seal your lips tightly around it. 5. Breathe in slowly and as deeply as possible, raising the piston or the ball toward the top of the column. 6. Hold your breath for 3-5 seconds or for as long as possible. Allow the piston or ball to fall to the bottom of the column. 7. Remove the mouthpiece from your mouth and breathe out normally. 8. Rest for a few seconds and repeat Steps 1 through 7 at least 10 times every 1-2 hours when you are awake. Take your time and take a few normal breaths between deep breaths. 9. The spirometer may include an indicator to show your best effort. Use the indicator as a goal to work toward during each repetition. 10. After each set of 10 deep breaths, practice coughing to be sure your lungs are clear. If you have an incision (the cut made at the time of surgery), support your incision when coughing by placing a pillow or rolled up towels firmly against it. Once you are able to get out of bed, walk around indoors and cough well. You may stop using the incentive spirometer when instructed by your caregiver.  RISKS AND COMPLICATIONS  Take your time so you do not get dizzy or light-headed.  If you are in pain, you may need to take or ask for pain medication before doing incentive spirometry. It is harder to take a deep breath if you are having pain. AFTER USE  Rest and breathe slowly and easily.  It can be helpful to keep track of a log of your progress. Your caregiver can provide you with a simple table to help with this. If you are using the spirometer at home, follow these instructions: SEEK MEDICAL CARE IF:  You are having difficultly using the spirometer.  You have trouble using the spirometer as often as instructed.  Your pain medication is not giving enough  relief while using the spirometer.  You develop fever of 100.5 F (38.1 C) or higher. SEEK IMMEDIATE MEDICAL CARE IF:   You cough up bloody sputum that had not been present before.  You develop fever of 102 F (38.9 C) or greater.  You develop worsening pain at or near the incision site. MAKE SURE YOU:   Understand these instructions.  Will watch your condition.  Will get help right away if you are not doing well or get worse. Document Released: 08/02/2006 Document Revised: 06/14/2011 Document Reviewed: 10/03/2006 [facility] Patient Information 2014 [place], [place].   ________________________________________________________________________

## 2020-06-30 NOTE — Progress Notes (Addendum)
COVID Vaccine Completed:  x2 Date COVID Vaccine completed:  2nd dose 07-2019 Has received booster: COVID vaccine manufacturer: Pfizer    [employer]   Date of COVID positive in last 90 days:  N/A  PCP - Farris Has, MD Cardiologist - Belva Crome, MD  Chest x-ray - 11-02-19 Epic EKG - 11-05-19 Epic Stress Test -  ECHO - 05-28-19 Epic  Cardiac Cath -  Pacemaker/ICD device last checked: Spinal Cord Stimulator:  Sleep Study - N/A CPAP -   Fasting Blood Sugar - N/A Checks Blood Sugar _____ times a day  Blood Thinner Instructions:  N?A Aspirin Instructions: Last Dose:  Activity level:  Can go up a flight of stairs and perform activities of daily living without stopping and without symptoms of chest pain or shortness of breath.   Anesthesia review: Cardiology eval for chest pain, tachycardia, HTN  BMI 58.24  Patient denies shortness of breath, fever, cough and chest pain at PAT appointment (interview completed over the phone)   Patient verbalized understanding of instructions that were given to them at the PAT appointment. Patient was also instructed that they will need to review over the PAT instructions again at home before surgery.

## 2020-07-02 ENCOUNTER — Other Ambulatory Visit: Payer: Self-pay | Admitting: Cardiology

## 2020-07-02 NOTE — Telephone Encounter (Signed)
Refill sent to pharmacy.   

## 2020-07-02 NOTE — H&P (Signed)
Rebecca [name] is an 50 y.o. female.   Chief Complaint: Left knee pain HPI: Rebecca [name] is in again about her knees.  The left side is the primary problem but she has also developed right-sided pain she thinks due to altered gait and to her accident.  Nevertheless the greatest pain is on the inside aspect of her left knee.  She has trouble twisting and bending.  She has been through an MRI scan since last time she was in.   MRI:  I reviewed an MRI scan films and report of a study done at [facility] on 06/03/20.  This shows a complex tear of the posterior horn of the medial meniscus with some mild patellofemoral degeneration.  Past Medical History:  Diagnosis Date  . COVID-19   . History of kidney stones   . Hypertension   . Hypothyroidism   . Thyroid activity decreased   . Thyroid disease   . Ureteral stone with hydronephrosis 11/18/2018    Past Surgical History:  Procedure Laterality Date  . CARPAL TUNNEL RELEASE Right 07/24/2012   Procedure: CARPAL TUNNEL RELEASE;  Surgeon: Vickki Hearing, MD;  Location: [facility];  Service: Orthopedics;  Laterality: Right;  Right Carpal Tunnel Release  . CYSTOSCOPY     x6 for stones-with stenting  . CYSTOSCOPY W/ URETERAL STENT PLACEMENT Bilateral 02/10/2016   Procedure: CYSTOSCOPY WITH RETROGRADE PYELOGRAM/BILATERAL URETERAL STENT PLACEMENT;  Surgeon: Sebastian Ache, MD;  Location: [facility];  Service: Urology;  Laterality: Bilateral;  . CYSTOSCOPY W/ URETERAL STENT PLACEMENT Bilateral 11/18/2018   Procedure: CYSTOSCOPY WITH RETROGRADE PYELOGRAM/URETERAL STENT PLACEMENT;  Surgeon: Sebastian Ache, MD;  Location: [facility];  Service: Urology;  Laterality: Bilateral;  . CYSTOSCOPY WITH RETROGRADE PYELOGRAM, URETEROSCOPY AND STENT PLACEMENT Bilateral 11/24/2018   Procedure: CYSTOSCOPY WITH RETROGRADE PYELOGRAM, URETEROSCOPY AND STENT PLACEMENT;  Surgeon: Sebastian Ache, MD;  Location: [facility];  Service: Urology;  Laterality: Bilateral;  75 MINS  .  CYSTOSCOPY/URETEROSCOPY/HOLMIUM LASER/STENT PLACEMENT Bilateral 03/10/2016   Procedure: CYSTOSCOPY/BILATERAL URETEROSCOPY/ BILATERAL HOLMIUM LASER /BILATERALSTENT PLACEMENT/ BILATERAL RETOGRADE PYLEOGRAM;  Surgeon: Sebastian Ache, MD;  Location: [facility];  Service: Urology;  Laterality: Bilateral;  . DILATION AND CURETTAGE OF UTERUS    . FOOT SURGERY    . HOLMIUM LASER APPLICATION Bilateral 11/24/2018   Procedure: HOLMIUM LASER APPLICATION;  Surgeon: Sebastian Ache, MD;  Location: [facility];  Service: Urology;  Laterality: Bilateral;  . HYSTEROSCOPY N/A 09/22/2018   Procedure: HYSTEROSCOPY W/ IUD REMOVAL AND REPLACEMENT;  Surgeon: Myna Hidalgo, DO;  Location: [facility];  Service: Gynecology;  Laterality: N/A;  . INTRAUTERINE DEVICE (IUD) INSERTION N/A 09/22/2018   Procedure: Intrauterine Device (Iud) Insertion;  Surgeon: Myna Hidalgo, DO;  Location: [facility];  Service: Gynecology;  Laterality: N/A;  . IUD REMOVAL N/A 09/22/2018   Procedure: Intrauterine Device (Iud) Removal;  Surgeon: Myna Hidalgo, DO;  Location: [facility];  Service: Gynecology;  Laterality: N/A;  . THYROIDECTOMY      Family History  Problem Relation Age of Onset  . Heart attack Mother   . Dementia Mother   . Stroke Mother   . Pulmonary embolism Mother   . Heart attack Father   . Dementia Father   . Lupus Sister   . Kidney failure Sister   . Stroke Maternal Grandmother   . Heart attack Maternal Grandmother   . Bladder Cancer Maternal Grandfather    Social History:  reports that she has never smoked. She has never used smokeless tobacco. She reports current alcohol use. She reports  that she does not use drugs.  Allergies: No Known Allergies  No medications prior to admission.    No results found for this or any previous visit (from the past 48 hour(s)). No results found.  Review of Systems  Musculoskeletal: Positive for arthralgias.       Left knee  All other systems reviewed and are negative.   There were no vitals  taken for this visit. Physical Exam Constitutional:      Appearance: Normal appearance.  HENT:     Head: Normocephalic and atraumatic.     Nose: Nose normal.     Mouth/Throat:     Mouth: Mucous membranes are dry.  Eyes:     Extraocular Movements: Extraocular movements intact.  Cardiovascular:     Rate and Rhythm: Normal rate and regular rhythm.  Pulmonary:     Effort: Pulmonary effort is normal.  Abdominal:     Palpations: Abdomen is soft.  Musculoskeletal:     Cervical back: Normal range of motion.     Comments: Left knee motion is about 0-115.  She has intense medial joint line pain.  McMurray's test causes pain in that direction.  Hip motion is full and pain free and SLR is negative on both sides.  There is no palpable LAD behind either knee.  Sensation and motor function are intact on both sides and there are palpable pulses on both sides.   Skin:    General: Skin is warm and dry.  Neurological:     General: No focal deficit present.     Mental Status: She is alert and oriented to person, place, and time.  Psychiatric:        Mood and Affect: Mood normal.        Behavior: Behavior normal.        Thought Content: Thought content normal.        Judgment: Judgment normal.      Assessment/Plan Assessment: Left knee torn medial meniscus by MRI 2022 injected 03/12/20  Plan: Rebecca [name] has some terrible knees pain.  This has been going on for months since her injury.  By MRI she has a significant medial meniscus tear with just some mild degenerative change. I think we have a chance to help her with a knee arthroscopy. I reviewed risks of anesthesia and infection as well as potential for DVT related to a knee arthroscopy.  I've stressed the importance of some postoperative physical therapy to optimize results and we will try to set up an appointment.  Two to four  weeks for recovery would be typical but that is a little variable.    Ginger Organ Cadie Sorci, PA-C 07/02/2020, 1:23 PM

## 2020-07-03 DIAGNOSIS — R269 Unspecified abnormalities of gait and mobility: Secondary | ICD-10-CM | POA: Diagnosis not present

## 2020-07-03 DIAGNOSIS — M25571 Pain in right ankle and joints of right foot: Secondary | ICD-10-CM | POA: Diagnosis not present

## 2020-07-03 DIAGNOSIS — M545 Low back pain, unspecified: Secondary | ICD-10-CM | POA: Diagnosis not present

## 2020-07-03 DIAGNOSIS — M722 Plantar fascial fibromatosis: Secondary | ICD-10-CM | POA: Diagnosis not present

## 2020-07-04 ENCOUNTER — Encounter (HOSPITAL_COMMUNITY)
Admission: RE | Admit: 2020-07-04 | Discharge: 2020-07-04 | Disposition: A | Discharge status: Home / Self Care | Payer: BLUE CROSS/BLUE SHIELD | Source: Ambulatory Visit | Attending: Orthopaedic Surgery | Admitting: Orthopaedic Surgery

## 2020-07-04 ENCOUNTER — Encounter (HOSPITAL_COMMUNITY): Payer: Self-pay

## 2020-07-04 ENCOUNTER — Other Ambulatory Visit: Payer: Self-pay

## 2020-07-04 ENCOUNTER — Other Ambulatory Visit (HOSPITAL_COMMUNITY)
Admission: RE | Admit: 2020-07-04 | Discharge: 2020-07-04 | Disposition: A | Discharge status: Home / Self Care | Payer: BLUE CROSS/BLUE SHIELD | Source: Ambulatory Visit | Attending: Orthopaedic Surgery | Admitting: Orthopaedic Surgery

## 2020-07-04 DIAGNOSIS — Z20822 Contact with and (suspected) exposure to covid-19: Secondary | ICD-10-CM | POA: Diagnosis not present

## 2020-07-04 DIAGNOSIS — Z01812 Encounter for preprocedural laboratory examination: Secondary | ICD-10-CM | POA: Diagnosis not present

## 2020-07-04 LAB — CBC
HCT: 46.9 % — ABNORMAL HIGH (ref 36.0–46.0)
Hemoglobin: 15.7 g/dL — ABNORMAL HIGH (ref 12.0–15.0)
MCH: 31.7 pg (ref 26.0–34.0)
MCHC: 33.5 g/dL (ref 30.0–36.0)
MCV: 94.7 fL (ref 80.0–100.0)
Platelets: 316 10*3/uL (ref 150–400)
RBC: 4.95 MIL/uL (ref 3.87–5.11)
RDW: 13.4 % (ref 11.5–15.5)
WBC: 7.2 10*3/uL (ref 4.0–10.5)
nRBC: 0 % (ref 0.0–0.2)

## 2020-07-04 LAB — BASIC METABOLIC PANEL
Anion gap: 9 (ref 5–15)
BUN: 14 mg/dL (ref 6–20)
CO2: 22 mmol/L (ref 22–32)
Calcium: 9 mg/dL (ref 8.9–10.3)
Chloride: 110 mmol/L (ref 98–111)
Creatinine, Ser: 0.94 mg/dL (ref 0.44–1.00)
GFR, Estimated: >60 mL/min (ref >60)
Glucose, Bld: 127 mg/dL — ABNORMAL HIGH (ref 70–99)
Potassium: 4.2 mmol/L (ref 3.5–5.1)
Sodium: 141 mmol/L (ref 135–145)

## 2020-07-04 LAB — SARS CORONAVIRUS 2 (TAT 6-24 HRS): SARS Coronavirus 2: NEGATIVE

## 2020-07-07 MED ORDER — DEXTROSE 5 % IV SOLN
3.0000 g | INTRAVENOUS | Status: AC
  Administered 2020-07-08: 3 g via INTRAVENOUS
  Filled 2020-07-07: qty 3

## 2020-07-08 ENCOUNTER — Ambulatory Visit (HOSPITAL_COMMUNITY): Payer: BLUE CROSS/BLUE SHIELD | Admitting: Physician Assistant

## 2020-07-08 ENCOUNTER — Encounter (HOSPITAL_COMMUNITY)
Admission: RE | Disposition: A | Discharge status: Home / Self Care | Payer: Self-pay | Source: Home / Self Care | Attending: Orthopaedic Surgery

## 2020-07-08 ENCOUNTER — Ambulatory Visit (HOSPITAL_COMMUNITY)
Admission: RE | Admit: 2020-07-08 | Discharge: 2020-07-08 | Disposition: A | Discharge status: Home / Self Care | Payer: BLUE CROSS/BLUE SHIELD | Attending: Orthopaedic Surgery | Admitting: Orthopaedic Surgery

## 2020-07-08 ENCOUNTER — Encounter (HOSPITAL_COMMUNITY): Payer: Self-pay | Admitting: Orthopaedic Surgery

## 2020-07-08 ENCOUNTER — Ambulatory Visit (HOSPITAL_COMMUNITY): Payer: BLUE CROSS/BLUE SHIELD | Admitting: Anesthesiology

## 2020-07-08 DIAGNOSIS — Z975 Presence of (intrauterine) contraceptive device: Secondary | ICD-10-CM | POA: Diagnosis not present

## 2020-07-08 DIAGNOSIS — M1712 Unilateral primary osteoarthritis, left knee: Secondary | ICD-10-CM | POA: Diagnosis not present

## 2020-07-08 DIAGNOSIS — Z8616 Personal history of COVID-19: Secondary | ICD-10-CM | POA: Diagnosis not present

## 2020-07-08 DIAGNOSIS — E039 Hypothyroidism, unspecified: Secondary | ICD-10-CM | POA: Diagnosis not present

## 2020-07-08 DIAGNOSIS — Z87442 Personal history of urinary calculi: Secondary | ICD-10-CM | POA: Diagnosis not present

## 2020-07-08 DIAGNOSIS — M2342 Loose body in knee, left knee: Secondary | ICD-10-CM | POA: Diagnosis not present

## 2020-07-08 DIAGNOSIS — Z960 Presence of urogenital implants: Secondary | ICD-10-CM | POA: Diagnosis not present

## 2020-07-08 DIAGNOSIS — I10 Essential (primary) hypertension: Secondary | ICD-10-CM | POA: Diagnosis not present

## 2020-07-08 DIAGNOSIS — M25562 Pain in left knee: Secondary | ICD-10-CM

## 2020-07-08 DIAGNOSIS — E890 Postprocedural hypothyroidism: Secondary | ICD-10-CM | POA: Insufficient documentation

## 2020-07-08 DIAGNOSIS — S83242A Other tear of medial meniscus, current injury, left knee, initial encounter: Secondary | ICD-10-CM | POA: Diagnosis not present

## 2020-07-08 DIAGNOSIS — Z8249 Family history of ischemic heart disease and other diseases of the circulatory system: Secondary | ICD-10-CM | POA: Diagnosis not present

## 2020-07-08 DIAGNOSIS — X58XXXA Exposure to other specified factors, initial encounter: Secondary | ICD-10-CM | POA: Insufficient documentation

## 2020-07-08 HISTORY — PX: KNEE ARTHROSCOPY: SHX127

## 2020-07-08 LAB — PREGNANCY, URINE: Preg Test, Ur: NEGATIVE

## 2020-07-08 SURGERY — ARTHROSCOPY, KNEE
Anesthesia: General | Site: Knee | Laterality: Left

## 2020-07-08 MED ORDER — MEPERIDINE HCL 50 MG/ML IJ SOLN: 6.2500 mg | INTRAMUSCULAR | Status: DC | PRN

## 2020-07-08 MED ORDER — BUPIVACAINE-EPINEPHRINE 0.5% -1:200000 IJ SOLN
INTRAMUSCULAR | Status: DC | PRN
  Administered 2020-07-08: 20 mL

## 2020-07-08 MED ORDER — KETOROLAC TROMETHAMINE 30 MG/ML IJ SOLN
30.0000 mg | Freq: Once | INTRAMUSCULAR | Status: AC | PRN
  Administered 2020-07-08: 30 mg via INTRAVENOUS

## 2020-07-08 MED ORDER — CHLORHEXIDINE GLUCONATE 0.12 % MT SOLN: 15.0000 mL | Freq: Once | OROMUCOSAL | Status: AC

## 2020-07-08 MED ORDER — LACTATED RINGERS IV SOLN: INTRAVENOUS | Status: DC

## 2020-07-08 MED ORDER — PROPOFOL 10 MG/ML IV BOLUS
INTRAVENOUS | Status: AC
  Filled 2020-07-08: qty 20

## 2020-07-08 MED ORDER — MIDAZOLAM HCL 2 MG/2ML IJ SOLN
INTRAMUSCULAR | Status: AC
  Filled 2020-07-08: qty 2

## 2020-07-08 MED ORDER — OXYCODONE HCL 5 MG/5ML PO SOLN: 5.0000 mg | Freq: Once | ORAL | Status: DC | PRN

## 2020-07-08 MED ORDER — ONDANSETRON HCL 4 MG/2ML IJ SOLN
INTRAMUSCULAR | Status: DC | PRN
  Administered 2020-07-08: 4 mg via INTRAVENOUS

## 2020-07-08 MED ORDER — BUPIVACAINE-EPINEPHRINE 0.5% -1:200000 IJ SOLN
INTRAMUSCULAR | Status: AC
  Filled 2020-07-08: qty 1

## 2020-07-08 MED ORDER — KETOROLAC TROMETHAMINE 30 MG/ML IJ SOLN
INTRAMUSCULAR | Status: AC
  Filled 2020-07-08: qty 1

## 2020-07-08 MED ORDER — OXYCODONE HCL 5 MG PO TABS: 5.0000 mg | ORAL_TABLET | Freq: Once | ORAL | Status: DC | PRN

## 2020-07-08 MED ORDER — ONDANSETRON HCL 4 MG/2ML IJ SOLN
INTRAMUSCULAR | Status: AC
  Filled 2020-07-08: qty 2

## 2020-07-08 MED ORDER — ORAL CARE MOUTH RINSE
15.0000 mL | Freq: Once | OROMUCOSAL | Status: AC
  Administered 2020-07-08: 15 mL via OROMUCOSAL

## 2020-07-08 MED ORDER — MIDAZOLAM HCL 5 MG/5ML IJ SOLN
INTRAMUSCULAR | Status: DC | PRN
  Administered 2020-07-08: 2 mg via INTRAVENOUS

## 2020-07-08 MED ORDER — PROPOFOL 10 MG/ML IV BOLUS
INTRAVENOUS | Status: DC | PRN
  Administered 2020-07-08: 200 mg via INTRAVENOUS

## 2020-07-08 MED ORDER — LIDOCAINE 2% (20 MG/ML) 5 ML SYRINGE
INTRAMUSCULAR | Status: DC | PRN
  Administered 2020-07-08: 100 mg via INTRAVENOUS

## 2020-07-08 MED ORDER — HYDROMORPHONE HCL 1 MG/ML IJ SOLN
INTRAMUSCULAR | Status: AC
  Filled 2020-07-08: qty 1

## 2020-07-08 MED ORDER — SCOPOLAMINE 1 MG/3DAYS TD PT72
MEDICATED_PATCH | TRANSDERMAL | Status: AC
  Administered 2020-07-08: 1.5 mg via TRANSDERMAL
  Filled 2020-07-08: qty 1

## 2020-07-08 MED ORDER — ACETAMINOPHEN 500 MG PO TABS
ORAL_TABLET | ORAL | Status: AC
  Administered 2020-07-08: 1000 mg via ORAL
  Filled 2020-07-08: qty 2

## 2020-07-08 MED ORDER — AMISULPRIDE (ANTIEMETIC) 5 MG/2ML IV SOLN
10.0000 mg | Freq: Once | INTRAVENOUS | Status: DC | PRN
Start: 2020-07-08 — End: 2020-07-08

## 2020-07-08 MED ORDER — DEXAMETHASONE SODIUM PHOSPHATE 10 MG/ML IJ SOLN
INTRAMUSCULAR | Status: DC | PRN
  Administered 2020-07-08: 10 mg via INTRAVENOUS

## 2020-07-08 MED ORDER — ACETAMINOPHEN 500 MG PO TABS: 1000.0000 mg | ORAL_TABLET | Freq: Once | ORAL | Status: AC

## 2020-07-08 MED ORDER — FENTANYL CITRATE (PF) 100 MCG/2ML IJ SOLN
INTRAMUSCULAR | Status: AC
  Filled 2020-07-08: qty 2

## 2020-07-08 MED ORDER — DEXAMETHASONE SODIUM PHOSPHATE 10 MG/ML IJ SOLN
INTRAMUSCULAR | Status: AC
  Filled 2020-07-08: qty 1

## 2020-07-08 MED ORDER — FENTANYL CITRATE (PF) 100 MCG/2ML IJ SOLN
INTRAMUSCULAR | Status: DC | PRN
  Administered 2020-07-08 (×4): 25 ug via INTRAVENOUS

## 2020-07-08 MED ORDER — SODIUM CHLORIDE 0.9 % IR SOLN
Status: DC | PRN
  Administered 2020-07-08: 3000 mL

## 2020-07-08 MED ORDER — HYDROMORPHONE HCL 1 MG/ML IJ SOLN
0.2500 mg | INTRAMUSCULAR | Status: DC | PRN
Start: 2020-07-08 — End: 2020-07-08
  Administered 2020-07-08 (×2): 0.5 mg via INTRAVENOUS

## 2020-07-08 MED ORDER — SCOPOLAMINE 1 MG/3DAYS TD PT72: 1.0000 | MEDICATED_PATCH | TRANSDERMAL | Status: DC

## 2020-07-08 MED ORDER — PROMETHAZINE HCL 25 MG/ML IJ SOLN: 6.2500 mg | INTRAMUSCULAR | Status: DC | PRN

## 2020-07-08 MED ORDER — LIDOCAINE 2% (20 MG/ML) 5 ML SYRINGE
INTRAMUSCULAR | Status: AC
  Filled 2020-07-08: qty 5

## 2020-07-08 MED ORDER — HYDROCODONE-ACETAMINOPHEN 5-325 MG PO TABS: 1.0000 | ORAL_TABLET | Freq: Four times a day (QID) | ORAL | 0 refills | Status: DC | PRN

## 2020-07-08 SURGICAL SUPPLY — 25 items
BNDG COHESIVE 6X5 TAN STRL LF (GAUZE/BANDAGES/DRESSINGS) ×2 IMPLANT
BNDG ELASTIC 6X5.8 VLCR STR LF (GAUZE/BANDAGES/DRESSINGS) ×2 IMPLANT
BNDG GAUZE ELAST 4 BULKY (GAUZE/BANDAGES/DRESSINGS) ×2 IMPLANT
DISSECTOR 3.5MM X 13CM (MISCELLANEOUS) ×2 IMPLANT
DRAPE ARTHROSCOPY W/POUCH 114 (DRAPES) ×2 IMPLANT
DRAPE SHEET LG 3/4 BI-LAMINATE (DRAPES) ×2 IMPLANT
DRAPE U-SHAPE 47X51 STRL (DRAPES) ×2 IMPLANT
DRSG ADAPTIC 3X8 NADH LF (GAUZE/BANDAGES/DRESSINGS) ×2 IMPLANT
DRSG EMULSION OIL 3X3 NADH (GAUZE/BANDAGES/DRESSINGS) ×2 IMPLANT
DRSG PAD ABDOMINAL 8X10 ST (GAUZE/BANDAGES/DRESSINGS) ×2 IMPLANT
DURAPREP 26ML APPLICATOR (WOUND CARE) ×4 IMPLANT
GAUZE SPONGE 4X4 12PLY STRL (GAUZE/BANDAGES/DRESSINGS) ×2 IMPLANT
GLOVE SRG 8 PF TXTR STRL LF DI (GLOVE) ×2 IMPLANT
GLOVE SURG ENC MOIS LTX SZ8 (GLOVE) ×4 IMPLANT
GLOVE SURG UNDER POLY LF SZ8 (GLOVE) ×2
GOWN STRL REUS W/ TWL XL LVL3 (GOWN DISPOSABLE) ×2 IMPLANT
GOWN STRL REUS W/TWL XL LVL3 (GOWN DISPOSABLE) ×2
KIT BASIN OR (CUSTOM PROCEDURE TRAY) ×2 IMPLANT
KIT TURNOVER KIT A (KITS) ×2 IMPLANT
MANIFOLD NEPTUNE II (INSTRUMENTS) ×2 IMPLANT
NEEDLE HYPO 22GX1.5 SAFETY (NEEDLE) ×2 IMPLANT
PACK ARTHROSCOPY WL (CUSTOM PROCEDURE TRAY) ×2 IMPLANT
SYR CONTROL 10ML LL (SYRINGE) ×2 IMPLANT
TOWEL OR 17X26 10 PK STRL BLUE (TOWEL DISPOSABLE) ×2 IMPLANT
TUBING ARTHROSCOPY IRRIG 16FT (MISCELLANEOUS) ×2 IMPLANT

## 2020-07-08 NOTE — Anesthesia Postprocedure Evaluation (Signed)
Anesthesia Post Note  Patient: Rebecca [name]  Procedure(s) Performed: LEFT KNEE ARTHROSCOPY (Left Knee)     Patient location during evaluation: PACU Anesthesia Type: General Level of consciousness: awake and alert, oriented and patient cooperative Pain management: pain level controlled Vital Signs Assessment: post-procedure vital signs reviewed and stable Respiratory status: spontaneous breathing, nonlabored ventilation and respiratory function stable Cardiovascular status: blood pressure returned to baseline and stable Postop Assessment: no apparent nausea or vomiting Anesthetic complications: no   No complications documented.  Last Vitals:  Vitals:   07/08/20 1254 07/08/20 1300  BP: 119/84 123/74  Pulse: 78 67  Resp: (!) 23 18  Temp: 36.7 C   SpO2: 97% 98%    Last Pain:  Vitals:   07/08/20 1300  TempSrc:   PainSc: Asleep                 Lannie Fields

## 2020-07-08 NOTE — Addendum Note (Signed)
Addendum  created 07/08/20 1352 by Florene Route, CRNA   Charge Capture section accepted

## 2020-07-08 NOTE — Transfer of Care (Signed)
Immediate Anesthesia Transfer of Care Note  Patient: Rebecca [name]  Procedure(s) Performed: LEFT KNEE ARTHROSCOPY (Left Knee)  Patient Location: PACU  Anesthesia Type:General  Level of Consciousness: awake and alert   Airway & Oxygen Therapy: Patient Spontanous Breathing and Patient connected to face mask oxygen  Post-op Assessment: Report given to RN and Post -op Vital signs reviewed and stable  Post vital signs: Reviewed and stable  Last Vitals:  Vitals Value Taken Time  BP 119/84 07/08/20 1254  Temp    Pulse 74 07/08/20 1255  Resp 17 07/08/20 1255  SpO2 97 % 07/08/20 1255  Vitals shown include unvalidated device data.  Last Pain:  Vitals:   07/08/20 1001  TempSrc: Oral         Complications: No complications documented.

## 2020-07-08 NOTE — Interval H&P Note (Signed)
History and Physical Interval Note:  07/08/2020 11:18 AM  Rebecca [name]  has presented today for surgery, with the diagnosis of LEFT KNEE MEDIAL MEDIAL TEAR AND CHONDROMALACIA.  The various methods of treatment have been discussed with the patient and family. After consideration of risks, benefits and other options for treatment, the patient has consented to  Procedure(s): LEFT KNEE ARTHROSCOPY (Left) as a surgical intervention.  The patient's history has been reviewed, patient examined, no change in status, stable for surgery.  I have reviewed the patient's chart and labs.  Questions were answered to the patient's satisfaction.     Velna Ochs

## 2020-07-08 NOTE — Op Note (Signed)
[name], [name] MEDICAL RECORD NO: [id number] ACCOUNT NO: [id number] DATE OF BIRTH: January 14, 1971 FACILITY: [facility] LOCATION: [facility] PHYSICIAN: Lubertha Basque. Jerl Santos, MD  Operative Report   DATE OF PROCEDURE: 07/08/2020  PREOPERATIVE DIAGNOSES:    1.  Left knee torn medial meniscus. 2.  Left knee degenerative joint disease.  POSTOPERATIVE DIAGNOSES:    1.  Left knee loose bodies. 2.  Left knee degenerative joint disease.  PROCEDURES:    1.  Left knee removal of loose bodies. 2.  Left knee abrasion chondroplasty, medial and patellofemoral.  ANESTHESIA:  General.  ATTENDING SURGEON:  Lubertha Basque. Jerl Santos, MD  ASSISTANT:  Elodia Florence, PA.  INDICATIONS FOR PROCEDURE:  The patient is a 50 year old woman with months of some left knee pain and mechanical symptoms.  This has persisted despite conservative measures.  By MRI scan, she has a torn medial meniscus and some chondromalacia.  She is  offered arthroscopy.  This was performed in the main operating room due to her size with BMI greater than 50.  Informed operative consent was obtained after discussion of the possible complications including, reaction to anesthesia, infection, DVT.  SUMMARY OF FINDINGS AND PROCEDURE:  Under general anesthesia, an arthroscopy of the left knee was performed.  Suprapatellar pouch was benign while the patellofemoral joint exhibited grade III change on the undersurface of the patella, addressed with a  thorough chondroplasty.  Medial compartment exhibited some grade III and focal grade IV change on the medial femoral condyle.  A thorough chondroplasty was done along with abrasion of bleeding bone in some small areas.  The meniscus was probed and  visualized and was found to be intact despite her MRI finding.  ACL looked normal.  In the lateral compartment, she had no evidence of meniscal or articular cartilage injury, but did have several loose bodies, which were removed, the largest of which  measured about 8 mm  in size.  The patient was scheduled to go home same day.  DESCRIPTION OF PROCEDURE:  The patient was taken to the operating suite where general anesthetic was applied without difficulty.  She was positioned supine and prepped and draped in normal sterile fashion.  After the administration of preoperative IV  Kefzol and appropriate timeout, an arthroscopy of the left knee was performed through a total of 2 portals.  Findings were as noted above and procedure consisted of removal of the loose bodies followed by chondroplasty and abrasion as described above.   Both menisci were probed and were found to be intact.  The knee was thoroughly irrigated followed by placement of some Marcaine with epinephrine and morphine.  Adaptic was placed over the portals followed by dry gauze and a loose Coban wrap.  Estimated  blood loss and intraoperative fluids can be obtained from anesthesia records.  DISPOSITION:  The patient was extubated in the operating room and taken to recovery room in stable condition.  Plans were for her to go home same day and follow up in the office in less than week.  I will contact her by phone tonight.   ROH D: 07/08/2020 12:48:50 pm T: 07/08/2020 8:36:00 pm  JOB: [id number]/ [id number]

## 2020-07-08 NOTE — Brief Op Note (Signed)
COMFORT IVERSEN [id number] 07/08/2020   PRE-OP DIAGNOSIS: left knee torn meniscus  POST-OP DIAGNOSIS: left knee LBs and chondromalacia  PROCEDURE: left knee scope with LB removal and CP  ANESTHESIA: general  Velna Ochs   Dictation #:  [id number]

## 2020-07-08 NOTE — Anesthesia Procedure Notes (Signed)
Procedure Name: LMA Insertion Date/Time: 07/08/2020 12:08 PM Performed by: Florene Route, CRNA Patient Re-evaluated:Patient Re-evaluated prior to induction Oxygen Delivery Method: Circle system utilized Preoxygenation: Pre-oxygenation with 100% oxygen Induction Type: IV induction LMA: LMA with gastric port inserted LMA Size: 4.0 Number of attempts: 1 Placement Confirmation: positive ETCO2 and breath sounds checked- equal and bilateral Tube secured with: Tape Dental Injury: Teeth and Oropharynx as per pre-operative assessment

## 2020-07-08 NOTE — Anesthesia Preprocedure Evaluation (Addendum)
Anesthesia Evaluation  Patient identified by MRN, date of birth, ID band Patient awake    Reviewed: Allergy & Precautions, NPO status , Patient's Chart, lab work & pertinent test results, reviewed documented beta blocker date and time   Airway Mallampati: III  TM Distance: >3 FB Neck ROM: Full    Dental no notable dental hx. (+) Teeth Intact, Dental Advisory Given,    Pulmonary neg pulmonary ROS,    Pulmonary exam normal breath sounds clear to auscultation       Cardiovascular hypertension, Pt. on home beta blockers Normal cardiovascular exam Rhythm:Regular Rate:Normal     Neuro/Psych negative neurological ROS  negative psych ROS   GI/Hepatic negative GI ROS, Neg liver ROS,   Endo/Other  Hypothyroidism Morbid obesityBMI 58  Renal/GU negative Renal ROS  negative genitourinary   Musculoskeletal negative musculoskeletal ROS (+)   Abdominal (+) + obese,   Peds  Hematology negative hematology ROS (+) hct 46.9    Anesthesia Other Findings   Reproductive/Obstetrics negative OB ROS                            Anesthesia Physical Anesthesia Plan  ASA: III  Anesthesia Plan: General   Post-op Pain Management:    Induction: Intravenous  PONV Risk Score and Plan: 3 and Ondansetron, Dexamethasone, Midazolam and Treatment may vary due to age or medical condition  Airway Management Planned: LMA  Additional Equipment: None  Intra-op Plan:   Post-operative Plan: Extubation in OR  Informed Consent: I have reviewed the patients History and Physical, chart, labs and discussed the procedure including the risks, benefits and alternatives for the proposed anesthesia with the patient or authorized representative who has indicated his/her understanding and acceptance.     Dental advisory given  Plan Discussed with: CRNA  Anesthesia Plan Comments:        Anesthesia Quick Evaluation

## 2020-07-09 ENCOUNTER — Encounter (HOSPITAL_COMMUNITY): Payer: Self-pay | Admitting: Orthopaedic Surgery

## 2020-07-15 DIAGNOSIS — M25562 Pain in left knee: Secondary | ICD-10-CM | POA: Diagnosis not present

## 2020-07-15 DIAGNOSIS — M25571 Pain in right ankle and joints of right foot: Secondary | ICD-10-CM | POA: Diagnosis not present

## 2020-07-15 DIAGNOSIS — R269 Unspecified abnormalities of gait and mobility: Secondary | ICD-10-CM | POA: Diagnosis not present

## 2020-07-15 DIAGNOSIS — M545 Low back pain, unspecified: Secondary | ICD-10-CM | POA: Diagnosis not present

## 2020-07-23 DIAGNOSIS — R269 Unspecified abnormalities of gait and mobility: Secondary | ICD-10-CM | POA: Diagnosis not present

## 2020-07-23 DIAGNOSIS — M25571 Pain in right ankle and joints of right foot: Secondary | ICD-10-CM | POA: Diagnosis not present

## 2020-07-23 DIAGNOSIS — M25562 Pain in left knee: Secondary | ICD-10-CM | POA: Diagnosis not present

## 2020-07-23 DIAGNOSIS — M545 Low back pain, unspecified: Secondary | ICD-10-CM | POA: Diagnosis not present

## 2020-07-29 DIAGNOSIS — M25571 Pain in right ankle and joints of right foot: Secondary | ICD-10-CM | POA: Diagnosis not present

## 2020-07-29 DIAGNOSIS — R269 Unspecified abnormalities of gait and mobility: Secondary | ICD-10-CM | POA: Diagnosis not present

## 2020-07-29 DIAGNOSIS — M545 Low back pain, unspecified: Secondary | ICD-10-CM | POA: Diagnosis not present

## 2020-07-29 DIAGNOSIS — M25562 Pain in left knee: Secondary | ICD-10-CM | POA: Diagnosis not present

## 2020-07-30 DIAGNOSIS — I10 Essential (primary) hypertension: Secondary | ICD-10-CM | POA: Diagnosis not present

## 2020-08-01 ENCOUNTER — Other Ambulatory Visit: Payer: Self-pay | Admitting: Cardiology

## 2020-08-04 DIAGNOSIS — Z9889 Other specified postprocedural states: Secondary | ICD-10-CM | POA: Diagnosis not present

## 2020-08-08 DIAGNOSIS — M25562 Pain in left knee: Secondary | ICD-10-CM | POA: Diagnosis not present

## 2020-08-08 DIAGNOSIS — M545 Low back pain, unspecified: Secondary | ICD-10-CM | POA: Diagnosis not present

## 2020-08-08 DIAGNOSIS — R269 Unspecified abnormalities of gait and mobility: Secondary | ICD-10-CM | POA: Diagnosis not present

## 2020-08-08 DIAGNOSIS — M25571 Pain in right ankle and joints of right foot: Secondary | ICD-10-CM | POA: Diagnosis not present

## 2020-08-18 DIAGNOSIS — M25571 Pain in right ankle and joints of right foot: Secondary | ICD-10-CM | POA: Diagnosis not present

## 2020-08-18 DIAGNOSIS — M25562 Pain in left knee: Secondary | ICD-10-CM | POA: Diagnosis not present

## 2020-08-18 DIAGNOSIS — R269 Unspecified abnormalities of gait and mobility: Secondary | ICD-10-CM | POA: Diagnosis not present

## 2020-08-18 DIAGNOSIS — M545 Low back pain, unspecified: Secondary | ICD-10-CM | POA: Diagnosis not present

## 2020-08-22 DIAGNOSIS — R269 Unspecified abnormalities of gait and mobility: Secondary | ICD-10-CM | POA: Diagnosis not present

## 2020-08-22 DIAGNOSIS — M545 Low back pain, unspecified: Secondary | ICD-10-CM | POA: Diagnosis not present

## 2020-08-22 DIAGNOSIS — M25562 Pain in left knee: Secondary | ICD-10-CM | POA: Diagnosis not present

## 2020-08-22 DIAGNOSIS — M25571 Pain in right ankle and joints of right foot: Secondary | ICD-10-CM | POA: Diagnosis not present

## 2020-08-25 DIAGNOSIS — M545 Low back pain, unspecified: Secondary | ICD-10-CM | POA: Diagnosis not present

## 2020-08-25 DIAGNOSIS — R269 Unspecified abnormalities of gait and mobility: Secondary | ICD-10-CM | POA: Diagnosis not present

## 2020-08-25 DIAGNOSIS — M25562 Pain in left knee: Secondary | ICD-10-CM | POA: Diagnosis not present

## 2020-08-25 DIAGNOSIS — M25571 Pain in right ankle and joints of right foot: Secondary | ICD-10-CM | POA: Diagnosis not present

## 2020-08-28 DIAGNOSIS — M25562 Pain in left knee: Secondary | ICD-10-CM | POA: Diagnosis not present

## 2020-08-28 DIAGNOSIS — R269 Unspecified abnormalities of gait and mobility: Secondary | ICD-10-CM | POA: Diagnosis not present

## 2020-08-28 DIAGNOSIS — M545 Low back pain, unspecified: Secondary | ICD-10-CM | POA: Diagnosis not present

## 2020-08-28 DIAGNOSIS — M25571 Pain in right ankle and joints of right foot: Secondary | ICD-10-CM | POA: Diagnosis not present

## 2020-09-03 DIAGNOSIS — M25571 Pain in right ankle and joints of right foot: Secondary | ICD-10-CM | POA: Diagnosis not present

## 2020-09-03 DIAGNOSIS — M25562 Pain in left knee: Secondary | ICD-10-CM | POA: Diagnosis not present

## 2020-09-03 DIAGNOSIS — R269 Unspecified abnormalities of gait and mobility: Secondary | ICD-10-CM | POA: Diagnosis not present

## 2020-09-03 DIAGNOSIS — M545 Low back pain, unspecified: Secondary | ICD-10-CM | POA: Diagnosis not present

## 2020-09-08 ENCOUNTER — Other Ambulatory Visit: Payer: Self-pay | Admitting: Orthopaedic Surgery

## 2020-09-08 ENCOUNTER — Other Ambulatory Visit: Payer: Self-pay | Admitting: Cardiology

## 2020-09-08 DIAGNOSIS — M25561 Pain in right knee: Secondary | ICD-10-CM

## 2020-09-09 ENCOUNTER — Other Ambulatory Visit: Payer: Self-pay | Admitting: Cardiology

## 2020-09-09 DIAGNOSIS — M25562 Pain in left knee: Secondary | ICD-10-CM | POA: Diagnosis not present

## 2020-09-09 DIAGNOSIS — R269 Unspecified abnormalities of gait and mobility: Secondary | ICD-10-CM | POA: Diagnosis not present

## 2020-09-09 DIAGNOSIS — M25571 Pain in right ankle and joints of right foot: Secondary | ICD-10-CM | POA: Diagnosis not present

## 2020-09-09 DIAGNOSIS — M545 Low back pain, unspecified: Secondary | ICD-10-CM | POA: Diagnosis not present

## 2020-09-16 ENCOUNTER — Ambulatory Visit
Admission: RE | Admit: 2020-09-16 | Discharge: 2020-09-16 | Disposition: A | Discharge status: Home / Self Care | Payer: BLUE CROSS/BLUE SHIELD | Source: Ambulatory Visit | Attending: Orthopaedic Surgery | Admitting: Orthopaedic Surgery

## 2020-09-16 ENCOUNTER — Other Ambulatory Visit: Payer: Self-pay

## 2020-09-16 DIAGNOSIS — M25561 Pain in right knee: Secondary | ICD-10-CM | POA: Diagnosis not present

## 2020-09-17 DIAGNOSIS — R269 Unspecified abnormalities of gait and mobility: Secondary | ICD-10-CM | POA: Diagnosis not present

## 2020-09-17 DIAGNOSIS — M545 Low back pain, unspecified: Secondary | ICD-10-CM | POA: Diagnosis not present

## 2020-09-17 DIAGNOSIS — M25571 Pain in right ankle and joints of right foot: Secondary | ICD-10-CM | POA: Diagnosis not present

## 2020-09-17 DIAGNOSIS — M25562 Pain in left knee: Secondary | ICD-10-CM | POA: Diagnosis not present

## 2020-09-20 DIAGNOSIS — S83241A Other tear of medial meniscus, current injury, right knee, initial encounter: Secondary | ICD-10-CM | POA: Diagnosis not present

## 2020-09-26 DIAGNOSIS — M25571 Pain in right ankle and joints of right foot: Secondary | ICD-10-CM | POA: Diagnosis not present

## 2020-09-26 DIAGNOSIS — R269 Unspecified abnormalities of gait and mobility: Secondary | ICD-10-CM | POA: Diagnosis not present

## 2020-09-26 DIAGNOSIS — M545 Low back pain, unspecified: Secondary | ICD-10-CM | POA: Diagnosis not present

## 2020-09-26 DIAGNOSIS — M25562 Pain in left knee: Secondary | ICD-10-CM | POA: Diagnosis not present

## 2020-10-13 ENCOUNTER — Other Ambulatory Visit: Payer: Self-pay | Admitting: Orthopaedic Surgery

## 2020-10-13 DIAGNOSIS — R7303 Prediabetes: Secondary | ICD-10-CM | POA: Diagnosis not present

## 2020-10-13 DIAGNOSIS — F900 Attention-deficit hyperactivity disorder, predominantly inattentive type: Secondary | ICD-10-CM | POA: Diagnosis not present

## 2020-10-13 DIAGNOSIS — E039 Hypothyroidism, unspecified: Secondary | ICD-10-CM | POA: Diagnosis not present

## 2020-10-13 DIAGNOSIS — I10 Essential (primary) hypertension: Secondary | ICD-10-CM | POA: Diagnosis not present

## 2020-10-21 DIAGNOSIS — Z87442 Personal history of urinary calculi: Secondary | ICD-10-CM | POA: Insufficient documentation

## 2020-10-21 DIAGNOSIS — U071 COVID-19: Secondary | ICD-10-CM | POA: Insufficient documentation

## 2020-10-23 ENCOUNTER — Ambulatory Visit: Payer: BLUE CROSS/BLUE SHIELD | Admitting: Cardiology

## 2020-10-27 NOTE — Patient Instructions (Signed)
DUE TO COVID-19 ONLY ONE VISITOR IS ALLOWED TO COME WITH YOU AND STAY IN THE WAITING ROOM ONLY DURING PRE OP AND PROCEDURE DAY OF SURGERY. THE 1 VISITOR  MAY VISIT WITH YOU AFTER SURGERY IN YOUR PRIVATE ROOM DURING VISITING HOURS ONLY!               Rebecca [name]   Your procedure is scheduled on: 11/04/20   Report to [facility] Main  Entrance   Report to admitting at: 10:00 AM     Call this number if you have problems the morning of surgery [phone]    Remember: NO SOLID FOOD AFTER MIDNIGHT THE NIGHT PRIOR TO SURGERY. NOTHING BY MOUTH EXCEPT CLEAR LIQUIDS UNTIL: 9:30 AM . PLEASE FINISH ENSURE DRINK PER SURGEON ORDER  WHICH NEEDS TO BE COMPLETED AT : 9:30 AM.   CLEAR LIQUID DIET  Foods Allowed                                                                     Foods Excluded  Coffee and tea, regular and decaf                             liquids that you cannot  Plain Jell-O any favor except red or purple                                           see through such as: Fruit ices (not with fruit pulp)                                     milk, soups, orange juice  Iced Popsicles                                    All solid food Carbonated beverages, regular and diet                                    Cranberry, grape and apple juices Sports drinks like Gatorade Lightly seasoned clear broth or consume(fat free) Sugar, honey syrup  Sample Menu Breakfast                                Lunch                                     Supper Cranberry juice                    Beef broth                            Chicken broth Jell-O  Grape juice                           Apple juice Coffee or tea                        Jell-O                                      Popsicle                                                Coffee or tea                        Coffee or tea  _____________________________________________________________________   BRUSH  YOUR TEETH MORNING OF SURGERY AND RINSE YOUR MOUTH OUT, NO CHEWING GUM CANDY OR MINTS.    Take these medicines the morning of surgery with A SIP OF WATER: metoprolol,synthroid.                                You may not have any metal on your body including hair pins and              piercings  Do not wear jewelry, make-up, lotions, powders or perfumes, deodorant             Do not wear nail polish on your fingernails.  Do not shave  48 hours prior to surgery.    Do not bring valuables to the hospital. [place] IS NOT             RESPONSIBLE   FOR VALUABLES.  Contacts, dentures or bridgework may not be worn into surgery.  Leave suitcase in the car. After surgery it may be brought to your room.     Patients discharged the day of surgery will not be allowed to drive home. IF YOU ARE HAVING SURGERY AND GOING HOME THE SAME DAY, YOU MUST HAVE AN ADULT TO DRIVE YOU HOME AND BE WITH YOU FOR 24 HOURS. YOU MAY GO HOME BY TAXI OR UBER OR ORTHERWISE, BUT AN ADULT MUST ACCOMPANY YOU HOME AND STAY WITH YOU FOR 24 HOURS.  Name and phone number of your driver:  Special Instructions: N/A              Please read over the following fact sheets you were given: _____________________________________________________________________           [facility] - Preparing for Surgery Before surgery, you can play an important role.  Because skin is not sterile, your skin needs to be as free of germs as possible.  You can reduce the number of germs on your skin by washing with CHG (chlorahexidine gluconate) soap before surgery.  CHG is an antiseptic cleaner which kills germs and bonds with the skin to continue killing germs even after washing. Please DO NOT use if you have an allergy to CHG or antibacterial soaps.  If your skin becomes reddened/irritated stop using the CHG and inform your nurse when you arrive at Short Stay. Do not shave (including legs and underarms) for at least 48 hours prior to the  first CHG  shower.  You may shave your face/neck. Please follow these instructions carefully:  1.  Shower with CHG Soap the night before surgery and the  morning of Surgery.  2.  If you choose to wash your hair, wash your hair first as usual with your  normal  shampoo.  3.  After you shampoo, rinse your hair and body thoroughly to remove the  shampoo.                           4.  Use CHG as you would any other liquid soap.  You can apply chg directly  to the skin and wash                       Gently with a scrungie or clean washcloth.  5.  Apply the CHG Soap to your body ONLY FROM THE NECK DOWN.   Do not use on face/ open                           Wound or open sores. Avoid contact with eyes, ears mouth and genitals (private parts).                       Wash face,  Genitals (private parts) with your normal soap.             6.  Wash thoroughly, paying special attention to the area where your surgery  will be performed.  7.  Thoroughly rinse your body with warm water from the neck down.  8.  DO NOT shower/wash with your normal soap after using and rinsing off  the CHG Soap.                9.  Pat yourself dry with a clean towel.            10.  Wear clean pajamas.            11.  Place clean sheets on your bed the night of your first shower and do not  sleep with pets. Day of Surgery : Do not apply any lotions/deodorants the morning of surgery.  Please wear clean clothes to the hospital/surgery center.  FAILURE TO FOLLOW THESE INSTRUCTIONS MAY RESULT IN THE CANCELLATION OF YOUR SURGERY PATIENT SIGNATURE_________________________________  NURSE SIGNATURE__________________________________  ________________________________________________________________________   Rebecca [name]  An incentive spirometer is a tool that can help keep your lungs clear and active. This tool measures how well you are filling your lungs with each breath. Taking long deep breaths may help reverse or decrease the chance  of developing breathing (pulmonary) problems (especially infection) following: A long period of time when you are unable to move or be active. BEFORE THE PROCEDURE  If the spirometer includes an indicator to show your best effort, your nurse or respiratory therapist will set it to a desired goal. If possible, sit up straight or lean slightly forward. Try not to slouch. Hold the incentive spirometer in an upright position. INSTRUCTIONS FOR USE  Sit on the edge of your bed if possible, or sit up as far as you can in bed or on a chair. Hold the incentive spirometer in an upright position. Breathe out normally. Place the mouthpiece in your mouth and seal your lips tightly around it. Breathe in slowly and as deeply as possible, raising the piston or the  ball toward the top of the column. Hold your breath for 3-5 seconds or for as long as possible. Allow the piston or ball to fall to the bottom of the column. Remove the mouthpiece from your mouth and breathe out normally. Rest for a few seconds and repeat Steps 1 through 7 at least 10 times every 1-2 hours when you are awake. Take your time and take a few normal breaths between deep breaths. The spirometer may include an indicator to show your best effort. Use the indicator as a goal to work toward during each repetition. After each set of 10 deep breaths, practice coughing to be sure your lungs are clear. If you have an incision (the cut made at the time of surgery), support your incision when coughing by placing a pillow or rolled up towels firmly against it. Once you are able to get out of bed, walk around indoors and cough well. You may stop using the incentive spirometer when instructed by your caregiver.  RISKS AND COMPLICATIONS Take your time so you do not get dizzy or light-headed. If you are in pain, you may need to take or ask for pain medication before doing incentive spirometry. It is harder to take a deep breath if you are having  pain. AFTER USE Rest and breathe slowly and easily. It can be helpful to keep track of a log of your progress. Your caregiver can provide you with a simple table to help with this. If you are using the spirometer at home, follow these instructions: SEEK MEDICAL CARE IF:  You are having difficultly using the spirometer. You have trouble using the spirometer as often as instructed. Your pain medication is not giving enough relief while using the spirometer. You develop fever of 100.5 F (38.1 C) or higher. SEEK IMMEDIATE MEDICAL CARE IF:  You cough up bloody sputum that had not been present before. You develop fever of 102 F (38.9 C) or greater. You develop worsening pain at or near the incision site. MAKE SURE YOU:  Understand these instructions. Will watch your condition. Will get help right away if you are not doing well or get worse. Document Released: 08/02/2006 Document Revised: 06/14/2011 Document Reviewed: 10/03/2006 [facility] Patient Information 2014 [place], [place].   ________________________________________________________________________

## 2020-10-28 ENCOUNTER — Encounter (HOSPITAL_COMMUNITY): Payer: Self-pay

## 2020-10-28 ENCOUNTER — Other Ambulatory Visit: Payer: Self-pay

## 2020-10-28 ENCOUNTER — Encounter (HOSPITAL_COMMUNITY)
Admission: RE | Admit: 2020-10-28 | Discharge: 2020-10-28 | Disposition: A | Discharge status: Home / Self Care | Payer: BC Managed Care – PPO | Source: Ambulatory Visit | Attending: Orthopaedic Surgery | Admitting: Orthopaedic Surgery

## 2020-10-28 DIAGNOSIS — Z01818 Encounter for other preprocedural examination: Secondary | ICD-10-CM | POA: Diagnosis not present

## 2020-10-28 HISTORY — DX: Unspecified osteoarthritis, unspecified site: M19.90

## 2020-10-28 LAB — CBC
HCT: 48.5 % — ABNORMAL HIGH (ref 36.0–46.0)
Hemoglobin: 16 g/dL — ABNORMAL HIGH (ref 12.0–15.0)
MCH: 31.2 pg (ref 26.0–34.0)
MCHC: 33 g/dL (ref 30.0–36.0)
MCV: 94.5 fL (ref 80.0–100.0)
Platelets: 352 10*3/uL (ref 150–400)
RBC: 5.13 MIL/uL — ABNORMAL HIGH (ref 3.87–5.11)
RDW: 13.3 % (ref 11.5–15.5)
WBC: 8.2 10*3/uL (ref 4.0–10.5)
nRBC: 0 % (ref 0.0–0.2)

## 2020-10-28 LAB — SURGICAL PCR SCREEN
MRSA, PCR: NEGATIVE
Staphylococcus aureus: POSITIVE — AB

## 2020-10-28 LAB — BASIC METABOLIC PANEL
Anion gap: 7 (ref 5–15)
BUN: 18 mg/dL (ref 6–20)
CO2: 25 mmol/L (ref 22–32)
Calcium: 8.9 mg/dL (ref 8.9–10.3)
Chloride: 105 mmol/L (ref 98–111)
Creatinine, Ser: 1.01 mg/dL — ABNORMAL HIGH (ref 0.44–1.00)
GFR, Estimated: >60 mL/min (ref >60)
Glucose, Bld: 145 mg/dL — ABNORMAL HIGH (ref 70–99)
Potassium: 3.9 mmol/L (ref 3.5–5.1)
Sodium: 137 mmol/L (ref 135–145)

## 2020-10-28 NOTE — Progress Notes (Signed)
COVID Vaccine Completed: Yes Date COVID Vaccine completed: Boaster: 09/2020 COVID vaccine manufacturer: Moderna     PCP - Dr. Farris Has Cardiologist -   Chest x-ray -  EKG -  Stress Test -  ECHO - 05/28/19 Cardiac Cath -  Pacemaker/ICD device last checked:  Sleep Study -  CPAP -   Fasting Blood Sugar -  Checks Blood Sugar _____ times a day  Blood Thinner Instructions: Aspirin Instructions: Last Dose:  Anesthesia review:   Patient denies shortness of breath, fever, cough and chest pain at PAT appointment   Patient verbalized understanding of instructions that were given to them at the PAT appointment. Patient was also instructed that they will need to review over the PAT instructions again at home before surgery.

## 2020-10-29 NOTE — Progress Notes (Signed)
PCR: STAPH Positive. 

## 2020-11-01 DIAGNOSIS — Z9889 Other specified postprocedural states: Secondary | ICD-10-CM | POA: Diagnosis not present

## 2020-11-01 DIAGNOSIS — M25561 Pain in right knee: Secondary | ICD-10-CM | POA: Diagnosis not present

## 2020-11-03 NOTE — Progress Notes (Signed)
Called and left message to return call- due to time change for OR.   Need to be here at 0730 .

## 2020-11-03 NOTE — H&P (Signed)
Rebecca [name] is an 50 y.o. female.   Chief Complaint: right knee pain HPI: Rebecca [name] is in for follow-up of her right knee MRI.  She says it feels a lot like the left knee did prior to the arthroscopy there back in April.  She has pain on the inside aspect of her right knee.  She says it will swell.  She is having trouble walking very far.  This wakes her from sleep.    MRI:  I reviewed an MRI scan films and report of a study done at [facility] on 09/16/20.  She has a large radial tear of the posterior horn of the medial meniscus with some mild to moderate patellofemoral change and an effusion.   Past Medical History:  Diagnosis Date  . Arthritis   . COVID-19   . History of kidney stones   . Hypertension   . Hypothyroidism   . Thyroid activity decreased   . Thyroid disease   . Ureteral stone with hydronephrosis 11/18/2018    Past Surgical History:  Procedure Laterality Date  . CARPAL TUNNEL RELEASE Right 07/24/2012   Procedure: CARPAL TUNNEL RELEASE;  Surgeon: Vickki Hearing, MD;  Location: [facility];  Service: Orthopedics;  Laterality: Right;  Right Carpal Tunnel Release  . CYSTOSCOPY     x6 for stones-with stenting  . CYSTOSCOPY W/ URETERAL STENT PLACEMENT Bilateral 02/10/2016   Procedure: CYSTOSCOPY WITH RETROGRADE PYELOGRAM/BILATERAL URETERAL STENT PLACEMENT;  Surgeon: Sebastian Ache, MD;  Location: [facility];  Service: Urology;  Laterality: Bilateral;  . CYSTOSCOPY W/ URETERAL STENT PLACEMENT Bilateral 11/18/2018   Procedure: CYSTOSCOPY WITH RETROGRADE PYELOGRAM/URETERAL STENT PLACEMENT;  Surgeon: Sebastian Ache, MD;  Location: [facility];  Service: Urology;  Laterality: Bilateral;  . CYSTOSCOPY WITH RETROGRADE PYELOGRAM, URETEROSCOPY AND STENT PLACEMENT Bilateral 11/24/2018   Procedure: CYSTOSCOPY WITH RETROGRADE PYELOGRAM, URETEROSCOPY AND STENT PLACEMENT;  Surgeon: Sebastian Ache, MD;  Location: [facility];  Service: Urology;  Laterality: Bilateral;  75 MINS  .  CYSTOSCOPY/URETEROSCOPY/HOLMIUM LASER/STENT PLACEMENT Bilateral 03/10/2016   Procedure: CYSTOSCOPY/BILATERAL URETEROSCOPY/ BILATERAL HOLMIUM LASER /BILATERALSTENT PLACEMENT/ BILATERAL RETOGRADE PYLEOGRAM;  Surgeon: Sebastian Ache, MD;  Location: [facility];  Service: Urology;  Laterality: Bilateral;  . DILATION AND CURETTAGE OF UTERUS    . FOOT SURGERY    . HOLMIUM LASER APPLICATION Bilateral 11/24/2018   Procedure: HOLMIUM LASER APPLICATION;  Surgeon: Sebastian Ache, MD;  Location: [facility];  Service: Urology;  Laterality: Bilateral;  . HYSTEROSCOPY N/A 09/22/2018   Procedure: HYSTEROSCOPY W/ IUD REMOVAL AND REPLACEMENT;  Surgeon: Myna Hidalgo, DO;  Location: [facility];  Service: Gynecology;  Laterality: N/A;  . INTRAUTERINE DEVICE (IUD) INSERTION N/A 09/22/2018   Procedure: Intrauterine Device (Iud) Insertion;  Surgeon: Myna Hidalgo, DO;  Location: [facility];  Service: Gynecology;  Laterality: N/A;  . IUD REMOVAL N/A 09/22/2018   Procedure: Intrauterine Device (Iud) Removal;  Surgeon: Myna Hidalgo, DO;  Location: [facility];  Service: Gynecology;  Laterality: N/A;  . KNEE ARTHROSCOPY Left 07/08/2020   Procedure: LEFT KNEE ARTHROSCOPY;  Surgeon: Marcene Corning, MD;  Location: [facility];  Service: Orthopedics;  Laterality: Left;  . THYROIDECTOMY    . WRIST SURGERY      Family History  Problem Relation Age of Onset  . Heart attack Mother   . Dementia Mother   . Stroke Mother   . Pulmonary embolism Mother   . Heart attack Father   . Dementia Father   . Lupus Sister   . Kidney failure Sister   . Stroke Maternal  Grandmother   . Heart attack Maternal Grandmother   . Bladder Cancer Maternal Grandfather    Social History:  reports that she has never smoked. She has never used smokeless tobacco. She reports previous alcohol use. She reports that she does not use drugs.  Allergies: No Known Allergies  No medications prior to admission.    No results found for this or any previous visit (from the past 48  hour(s)). No results found.  Review of Systems  Musculoskeletal:  Positive for arthralgias.       Right knee  All other systems reviewed and are negative.  There were no vitals taken for this visit. Physical Exam Constitutional:      Appearance: Normal appearance. She is obese.  HENT:     Head: Normocephalic and atraumatic.     Nose: Nose normal.     Mouth/Throat:     Pharynx: Oropharynx is clear.  Eyes:     Extraocular Movements: Extraocular movements intact.  Cardiovascular:     Rate and Rhythm: Normal rate.  Pulmonary:     Effort: Pulmonary effort is normal.  Abdominal:     Palpations: Abdomen is soft.  Musculoskeletal:     Comments: Right knee motion is about 0-115.  She has medial joint line pain and McMurray's test which causes pain in that direction.  Hip motion is full and pain free and SLR is negative on both sides.  There is no palpable LAD behind either knee.  Sensation and motor function are intact on both sides and there are palpable pulses on both sides.  Skin:    General: Skin is warm and dry.  Neurological:     General: No focal deficit present.     Mental Status: She is alert and oriented to person, place, and time. Mental status is at baseline.  Psychiatric:        Mood and Affect: Mood normal.        Behavior: Behavior normal.        Thought Content: Thought content normal.        Judgment: Judgment normal.     Assessment/Plan Assessment:  Right knee torn medial meniscus by MRI 2022 injected 03/26/20  Plan: Rebecca [name] continues with some terrible pain at the right knee.  She would like to get this fixed if at all possible.  She has an MRI almost identical to the MRI on the opposite knee. I reviewed risks of anesthesia and infection as well as potential for DVT related to a knee arthroscopy.  I've stressed the importance of some postoperative physical therapy to optimize results and we will try to set up an appointment.  Two to four  weeks for recovery would  be typical but that is a little variable.   Ginger Organ Rebecca Gott, PA-C 11/03/2020, 6:11 PM

## 2020-11-03 NOTE — Anesthesia Preprocedure Evaluation (Addendum)
Anesthesia Evaluation  Patient identified by MRN, date of birth, ID band Patient awake    Reviewed: Allergy & Precautions, NPO status , Patient's Chart, lab work & pertinent test results, reviewed documented beta blocker date and time   History of Anesthesia Complications Negative for: history of anesthetic complications  Airway Mallampati: II  TM Distance: >3 FB Neck ROM: Full    Dental  (+) Chipped,    Pulmonary neg pulmonary ROS,    Pulmonary exam normal        Cardiovascular hypertension, Pt. on home beta blockers and Pt. on medications Normal cardiovascular exam     Neuro/Psych ADHD   GI/Hepatic negative GI ROS, Neg liver ROS,   Endo/Other  Hypothyroidism Morbid obesity (BMI 59)  Renal/GU negative Renal ROS  negative genitourinary   Musculoskeletal  (+) Arthritis , RIGHT KNEE MEDIAL MENISCUS TEAR   Abdominal   Peds  Hematology negative hematology ROS (+)   Anesthesia Other Findings Day of surgery medications reviewed with patient.  Reproductive/Obstetrics negative OB ROS                            Anesthesia Physical Anesthesia Plan  ASA: 3  Anesthesia Plan: General   Post-op Pain Management:    Induction: Intravenous  PONV Risk Score and Plan: 4 or greater and Midazolam, Scopolamine patch - Pre-op, Treatment may vary due to age or medical condition, Dexamethasone and Ondansetron  Airway Management Planned: Oral ETT  Additional Equipment: None  Intra-op Plan:   Post-operative Plan: Extubation in OR  Informed Consent: I have reviewed the patients History and Physical, chart, labs and discussed the procedure including the risks, benefits and alternatives for the proposed anesthesia with the patient or authorized representative who has indicated his/her understanding and acceptance.     Dental advisory given  Plan Discussed with: CRNA  Anesthesia Plan Comments:         Anesthesia Quick Evaluation

## 2020-11-04 ENCOUNTER — Ambulatory Visit (HOSPITAL_COMMUNITY): Payer: BC Managed Care – PPO | Admitting: Anesthesiology

## 2020-11-04 ENCOUNTER — Encounter (HOSPITAL_COMMUNITY)
Admission: RE | Disposition: A | Discharge status: Home / Self Care | Payer: Self-pay | Source: Ambulatory Visit | Attending: Orthopaedic Surgery

## 2020-11-04 ENCOUNTER — Encounter (HOSPITAL_COMMUNITY): Payer: Self-pay | Admitting: Orthopaedic Surgery

## 2020-11-04 ENCOUNTER — Ambulatory Visit (HOSPITAL_COMMUNITY)
Admission: RE | Admit: 2020-11-04 | Discharge: 2020-11-04 | Disposition: A | Discharge status: Home / Self Care | Payer: BC Managed Care – PPO | Source: Ambulatory Visit | Attending: Orthopaedic Surgery | Admitting: Orthopaedic Surgery

## 2020-11-04 DIAGNOSIS — M25462 Effusion, left knee: Secondary | ICD-10-CM | POA: Insufficient documentation

## 2020-11-04 DIAGNOSIS — E890 Postprocedural hypothyroidism: Secondary | ICD-10-CM | POA: Diagnosis not present

## 2020-11-04 DIAGNOSIS — M25561 Pain in right knee: Secondary | ICD-10-CM

## 2020-11-04 DIAGNOSIS — M94261 Chondromalacia, right knee: Secondary | ICD-10-CM | POA: Diagnosis not present

## 2020-11-04 DIAGNOSIS — Z8616 Personal history of COVID-19: Secondary | ICD-10-CM | POA: Insufficient documentation

## 2020-11-04 DIAGNOSIS — I10 Essential (primary) hypertension: Secondary | ICD-10-CM | POA: Diagnosis not present

## 2020-11-04 DIAGNOSIS — S83241A Other tear of medial meniscus, current injury, right knee, initial encounter: Secondary | ICD-10-CM | POA: Diagnosis not present

## 2020-11-04 DIAGNOSIS — Z8249 Family history of ischemic heart disease and other diseases of the circulatory system: Secondary | ICD-10-CM | POA: Diagnosis not present

## 2020-11-04 DIAGNOSIS — M25461 Effusion, right knee: Secondary | ICD-10-CM | POA: Diagnosis not present

## 2020-11-04 DIAGNOSIS — E039 Hypothyroidism, unspecified: Secondary | ICD-10-CM | POA: Diagnosis not present

## 2020-11-04 HISTORY — PX: STERIOD INJECTION: SHX5046

## 2020-11-04 HISTORY — PX: KNEE ARTHROSCOPY: SHX127

## 2020-11-04 LAB — PREGNANCY, URINE: Preg Test, Ur: NEGATIVE

## 2020-11-04 SURGERY — ARTHROSCOPY, KNEE
Anesthesia: General | Site: Knee | Laterality: Right

## 2020-11-04 MED ORDER — KETOROLAC TROMETHAMINE 30 MG/ML IJ SOLN
INTRAMUSCULAR | Status: AC
  Administered 2020-11-04: 30 mg via INTRAVENOUS
  Filled 2020-11-04: qty 1

## 2020-11-04 MED ORDER — OXYCODONE HCL 5 MG/5ML PO SOLN
5.0000 mg | Freq: Once | ORAL | Status: AC | PRN
Start: 2020-11-04 — End: 2020-11-04

## 2020-11-04 MED ORDER — SUCCINYLCHOLINE CHLORIDE 200 MG/10ML IV SOSY
PREFILLED_SYRINGE | INTRAVENOUS | Status: AC
  Filled 2020-11-04: qty 10

## 2020-11-04 MED ORDER — SCOPOLAMINE 1 MG/3DAYS TD PT72
1.0000 | MEDICATED_PATCH | Freq: Once | TRANSDERMAL | Status: DC
  Administered 2020-11-04: 1.5 mg via TRANSDERMAL
  Filled 2020-11-04: qty 1

## 2020-11-04 MED ORDER — OXYCODONE HCL 5 MG PO TABS
ORAL_TABLET | ORAL | Status: AC
  Administered 2020-11-04: 5 mg via ORAL
  Filled 2020-11-04: qty 1

## 2020-11-04 MED ORDER — PROPOFOL 10 MG/ML IV BOLUS
INTRAVENOUS | Status: DC | PRN
  Administered 2020-11-04: 200 mg via INTRAVENOUS

## 2020-11-04 MED ORDER — DEXAMETHASONE SODIUM PHOSPHATE 10 MG/ML IJ SOLN
INTRAMUSCULAR | Status: AC
  Filled 2020-11-04: qty 1

## 2020-11-04 MED ORDER — CHLORHEXIDINE GLUCONATE 0.12 % MT SOLN
15.0000 mL | Freq: Once | OROMUCOSAL | Status: AC
  Administered 2020-11-04: 15 mL via OROMUCOSAL

## 2020-11-04 MED ORDER — SUCCINYLCHOLINE CHLORIDE 200 MG/10ML IV SOSY
PREFILLED_SYRINGE | INTRAVENOUS | Status: DC | PRN
  Administered 2020-11-04: 200 mg via INTRAVENOUS

## 2020-11-04 MED ORDER — FENTANYL CITRATE (PF) 100 MCG/2ML IJ SOLN
INTRAMUSCULAR | Status: AC
  Administered 2020-11-04: 50 ug via INTRAVENOUS
  Filled 2020-11-04: qty 2

## 2020-11-04 MED ORDER — FENTANYL CITRATE (PF) 100 MCG/2ML IJ SOLN: 25.0000 ug | INTRAMUSCULAR | Status: DC | PRN

## 2020-11-04 MED ORDER — KETOROLAC TROMETHAMINE 30 MG/ML IJ SOLN: 30.0000 mg | Freq: Once | INTRAMUSCULAR | Status: AC | PRN

## 2020-11-04 MED ORDER — PHENYLEPHRINE 40 MCG/ML (10ML) SYRINGE FOR IV PUSH (FOR BLOOD PRESSURE SUPPORT)
PREFILLED_SYRINGE | INTRAVENOUS | Status: AC
  Filled 2020-11-04: qty 10

## 2020-11-04 MED ORDER — ROCURONIUM BROMIDE 100 MG/10ML IV SOLN
INTRAVENOUS | Status: DC | PRN
  Administered 2020-11-04 (×2): 20 mg via INTRAVENOUS

## 2020-11-04 MED ORDER — PROMETHAZINE HCL 25 MG/ML IJ SOLN
INTRAMUSCULAR | Status: AC
  Filled 2020-11-04: qty 1

## 2020-11-04 MED ORDER — CEFAZOLIN SODIUM-DEXTROSE 2-4 GM/100ML-% IV SOLN
INTRAVENOUS | Status: AC
  Filled 2020-11-04: qty 100

## 2020-11-04 MED ORDER — FENTANYL CITRATE (PF) 100 MCG/2ML IJ SOLN
INTRAMUSCULAR | Status: AC
  Filled 2020-11-04: qty 2

## 2020-11-04 MED ORDER — DEXAMETHASONE SODIUM PHOSPHATE 10 MG/ML IJ SOLN
INTRAMUSCULAR | Status: DC | PRN
  Administered 2020-11-04: 10 mg via INTRAVENOUS

## 2020-11-04 MED ORDER — METHYLPREDNISOLONE ACETATE 40 MG/ML IJ SUSP
INTRAMUSCULAR | Status: DC | PRN
  Administered 2020-11-04: 80 mg via INTRA_ARTICULAR

## 2020-11-04 MED ORDER — SUGAMMADEX SODIUM 200 MG/2ML IV SOLN
INTRAVENOUS | Status: DC | PRN
  Administered 2020-11-04: 300 mg via INTRAVENOUS

## 2020-11-04 MED ORDER — ONDANSETRON HCL 4 MG/2ML IJ SOLN
INTRAMUSCULAR | Status: DC | PRN
  Administered 2020-11-04: 4 mg via INTRAVENOUS

## 2020-11-04 MED ORDER — METHYLPREDNISOLONE ACETATE 40 MG/ML IJ SUSP
INTRAMUSCULAR | Status: AC
  Filled 2020-11-04: qty 2

## 2020-11-04 MED ORDER — ONDANSETRON HCL 4 MG/2ML IJ SOLN
INTRAMUSCULAR | Status: AC
  Filled 2020-11-04: qty 2

## 2020-11-04 MED ORDER — HYDROMORPHONE HCL 1 MG/ML IJ SOLN
0.5000 mg | INTRAMUSCULAR | Status: AC | PRN
  Administered 2020-11-04: 0.5 mg via INTRAVENOUS

## 2020-11-04 MED ORDER — CEFAZOLIN IN SODIUM CHLORIDE 3-0.9 GM/100ML-% IV SOLN
3.0000 g | INTRAVENOUS | Status: AC
  Administered 2020-11-04: 1 g via INTRAVENOUS
  Administered 2020-11-04: 2 g via INTRAVENOUS
  Filled 2020-11-04: qty 100

## 2020-11-04 MED ORDER — HYDROCODONE-ACETAMINOPHEN 5-325 MG PO TABS: 1.0000 | ORAL_TABLET | Freq: Four times a day (QID) | ORAL | 0 refills | Status: AC | PRN

## 2020-11-04 MED ORDER — LACTATED RINGERS IV SOLN: INTRAVENOUS | Status: DC

## 2020-11-04 MED ORDER — ROCURONIUM BROMIDE 10 MG/ML (PF) SYRINGE
PREFILLED_SYRINGE | INTRAVENOUS | Status: AC
  Filled 2020-11-04: qty 10

## 2020-11-04 MED ORDER — ORAL CARE MOUTH RINSE: 15.0000 mL | Freq: Once | OROMUCOSAL | Status: AC

## 2020-11-04 MED ORDER — EPINEPHRINE (ANAPHYLAXIS) 1 MG/ML IJ SOLN
INTRAMUSCULAR | Status: DC | PRN
  Administered 2020-11-04: 1 mg

## 2020-11-04 MED ORDER — BUPIVACAINE-EPINEPHRINE (PF) 0.25% -1:200000 IJ SOLN
INTRAMUSCULAR | Status: AC
  Filled 2020-11-04: qty 30

## 2020-11-04 MED ORDER — OXYCODONE HCL 5 MG PO TABS
5.0000 mg | ORAL_TABLET | Freq: Once | ORAL | Status: AC | PRN
  Administered 2020-11-04: 5 mg via ORAL

## 2020-11-04 MED ORDER — PROMETHAZINE HCL 25 MG/ML IJ SOLN
6.2500 mg | INTRAMUSCULAR | Status: DC | PRN
Start: 2020-11-04 — End: 2020-11-04
  Administered 2020-11-04: 6.25 mg via INTRAVENOUS

## 2020-11-04 MED ORDER — FENTANYL CITRATE (PF) 100 MCG/2ML IJ SOLN
INTRAMUSCULAR | Status: DC | PRN
  Administered 2020-11-04: 25 ug via INTRAVENOUS
  Administered 2020-11-04 (×4): 50 ug via INTRAVENOUS
  Administered 2020-11-04: 25 ug via INTRAVENOUS
  Administered 2020-11-04: 50 ug via INTRAVENOUS

## 2020-11-04 MED ORDER — HYDROMORPHONE HCL 1 MG/ML IJ SOLN
INTRAMUSCULAR | Status: AC
  Administered 2020-11-04: 0.5 mg via INTRAVENOUS
  Filled 2020-11-04: qty 1

## 2020-11-04 MED ORDER — ACETAMINOPHEN 500 MG PO TABS
1000.0000 mg | ORAL_TABLET | Freq: Once | ORAL | Status: AC
  Administered 2020-11-04: 1000 mg via ORAL
  Filled 2020-11-04: qty 2

## 2020-11-04 MED ORDER — BUPIVACAINE-EPINEPHRINE (PF) 0.25% -1:200000 IJ SOLN
INTRAMUSCULAR | Status: DC | PRN
  Administered 2020-11-04: 4 mL

## 2020-11-04 MED ORDER — SODIUM CHLORIDE 0.9 % IR SOLN
Status: DC | PRN
  Administered 2020-11-04: 3000 mL

## 2020-11-04 MED ORDER — LIDOCAINE HCL (CARDIAC) PF 100 MG/5ML IV SOSY
PREFILLED_SYRINGE | INTRAVENOUS | Status: DC | PRN
  Administered 2020-11-04: 100 mg via INTRAVENOUS

## 2020-11-04 MED ORDER — OXYCODONE HCL 5 MG PO TABS: 5.0000 mg | ORAL_TABLET | Freq: Once | ORAL | Status: AC | PRN

## 2020-11-04 MED ORDER — EPINEPHRINE PF 1 MG/ML IJ SOLN
INTRAMUSCULAR | Status: AC
  Filled 2020-11-04: qty 1

## 2020-11-04 MED ORDER — OXYCODONE HCL 5 MG PO TABS
ORAL_TABLET | ORAL | Status: AC
  Filled 2020-11-04: qty 1

## 2020-11-04 MED ORDER — MIDAZOLAM HCL 2 MG/2ML IJ SOLN
INTRAMUSCULAR | Status: AC
  Filled 2020-11-04: qty 2

## 2020-11-04 SURGICAL SUPPLY — 32 items
BAG COUNTER SPONGE SURGICOUNT (BAG) ×3 IMPLANT
BLADE EXCALIBUR 4.0X13 (MISCELLANEOUS) ×3 IMPLANT
BNDG COHESIVE 3X5 TAN STRL LF (GAUZE/BANDAGES/DRESSINGS) ×3 IMPLANT
BNDG ELASTIC 6X5.8 VLCR STR LF (GAUZE/BANDAGES/DRESSINGS) ×3 IMPLANT
BNDG GAUZE ELAST 4 BULKY (GAUZE/BANDAGES/DRESSINGS) ×3 IMPLANT
COVER SURGICAL LIGHT HANDLE (MISCELLANEOUS) IMPLANT
DISSECTOR 3.5MM X 13CM (MISCELLANEOUS) ×3 IMPLANT
DRAPE ARTHROSCOPY W/POUCH 114 (DRAPES) ×3 IMPLANT
DRAPE SHEET LG 3/4 BI-LAMINATE (DRAPES) ×3 IMPLANT
DRAPE U-SHAPE 47X51 STRL (DRAPES) ×3 IMPLANT
DRSG EMULSION OIL 3X3 NADH (GAUZE/BANDAGES/DRESSINGS) ×3 IMPLANT
DRSG PAD ABDOMINAL 8X10 ST (GAUZE/BANDAGES/DRESSINGS) ×3 IMPLANT
DURAPREP 26ML APPLICATOR (WOUND CARE) ×6 IMPLANT
GAUZE SPONGE 4X4 12PLY STRL (GAUZE/BANDAGES/DRESSINGS) ×3 IMPLANT
GAUZE XEROFORM 1X8 LF (GAUZE/BANDAGES/DRESSINGS) ×3 IMPLANT
GLOVE SRG 8 PF TXTR STRL LF DI (GLOVE) ×4 IMPLANT
GLOVE SURG ENC MOIS LTX SZ8 (GLOVE) ×6 IMPLANT
GLOVE SURG UNDER POLY LF SZ8 (GLOVE) ×2
GOWN STRL REUS W/ TWL XL LVL3 (GOWN DISPOSABLE) ×4 IMPLANT
GOWN STRL REUS W/TWL XL LVL3 (GOWN DISPOSABLE) ×2
KIT BASIN OR (CUSTOM PROCEDURE TRAY) ×3 IMPLANT
KIT TURNOVER KIT A (KITS) ×3 IMPLANT
MANIFOLD NEPTUNE II (INSTRUMENTS) ×3 IMPLANT
NEEDLE HYPO 22GX1.5 SAFETY (NEEDLE) ×3 IMPLANT
NEEDLE SPNL 18GX3.5 QUINCKE PK (NEEDLE) IMPLANT
PACK ARTHROSCOPY WL (CUSTOM PROCEDURE TRAY) ×3 IMPLANT
PAD ARMBOARD 7.5X6 YLW CONV (MISCELLANEOUS) ×6 IMPLANT
PENCIL SMOKE EVACUATOR (MISCELLANEOUS) IMPLANT
SYR CONTROL 10ML LL (SYRINGE) ×3 IMPLANT
TOWEL OR 17X26 10 PK STRL BLUE (TOWEL DISPOSABLE) ×3 IMPLANT
TUBING ARTHROSCOPY IRRIG 16FT (MISCELLANEOUS) ×3 IMPLANT
WATER STERILE IRR 1000ML POUR (IV SOLUTION) ×3 IMPLANT

## 2020-11-04 NOTE — Op Note (Signed)
Rebecca [name] [id number] 11/04/2020   PRE-OP DIAGNOSIS: right knee TMM/CM and left knee effusion  POST-OP DIAGNOSIS: right knee CM and left knee effusion  PROCEDURE: right knee scope and left knee aspiration  ANESTHESIA: general  Velna Ochs   Dictation #:  [id number]

## 2020-11-04 NOTE — Op Note (Signed)
Rebecca [name], Rebecca [name] MEDICAL RECORD NO: [id number] ACCOUNT NO: [id number] DATE OF BIRTH: [birth year] FACILITY: [facility] LOCATION: [facility] PHYSICIAN: Lubertha Basque. Jerl Santos, MD  Operative Report   DATE OF PROCEDURE: 11/04/2020  PREOPERATIVE DIAGNOSES: 1.  Right knee torn medial meniscus. 2.  Right knee chondromalacia. 3.  Left knee effusion.  POSTOPERATIVE DIAGNOSES:   1.  Right knee chondromalacia. 2.  Left knee effusion.  PROCEDURES:   1.  Right knee arthroscopic chondroplasty. 2.  Left knee aspiration and injection.  ANESTHESIA:  General.  ATTENDING SURGEON:  Lubertha Basque. Jerl Santos, MD  ASSISTANT:  Elodia Florence, PA.  INDICATIONS:  The patient is a 50 year old woman who struggles with her weight.  She has also had some issues with both of her knees.  On the left, she is several months from a knee arthroscopy and has had some recurrent swelling over the last couple of  weeks.  She is offered aspiration under anesthesia as she is also headed towards an arthroscopy on the right.  There by MRI scan, she has torn meniscus and some degenerative changes.  She has failed some conservative measures on the right and at this  point was offered the arthroscopy, there.  Informed operative consent was obtained after discussion of possible complications including, reaction to anesthesia and infection.  SUMMARY OF FINDINGS AND PROCEDURE:  Under general anesthesia, we first aspirated and injected the left knee.  We removed about 20 mL of normal-appearing joint fluid and placed some Depo-Medrol and local anesthetic.  We then performed an arthroscopy of  the right knee.  The suprapatellar pouch was benign.  While the patellofemoral joint exhibited some grade III change on both aspects of the joint and thorough chondroplasties were done.  I did not really see any exposed bone.  In the medial compartment,  the meniscus was examined and probed and no real tear could be found.  She had some grade III change on the  medial femoral condyle and a chondroplasty was done.  ACL looked normal and the lateral compartment was completely benign.  DESCRIPTION OF PROCEDURE:  The patient was taken to the operating suite where general anesthetic was applied without difficulty.  She was positioned supine and prepped and draped in normal sterile fashion.  After the administration of preoperative IV  Kefzol and appropriate timeout, the left leg was prepped and as mentioned above a 20 mL of normal-appearing joint fluid was aspirated through a superolateral approach.  We then placed some Depo-Medrol and Marcaine.  A Band-Aid was applied.  We then  prepped and draped the right knee.  We performed an arthroscopy with findings as noted above.  Procedure consisted of the chondroplasty of medial and patellofemoral.  Again, I could find no definitive medial meniscus tear worthy of partial resection.   The knee was then thoroughly irrigated, followed by removal of arthroscopic equipment.  We placed Adaptic over the portals followed by dry gauze and a Coban wrap.  Estimated blood loss and Intraoperative fluids can be obtained from the anesthesia  records.  No tourniquet was used.  DISPOSITION:  The patient was extubated in the operating room and taken to recovery room in stable condition.  She was to go home same day and follow up in the office in less than a week.  I will contact her by phone tonight.   PUS D: 11/04/2020 11:36:17 am T: 11/04/2020 12:42:00 pm  JOB: [id number]/ [id number]

## 2020-11-04 NOTE — Interval H&P Note (Signed)
History and Physical Interval Note:  11/04/2020 10:01 AM  Rebecca [name]  has presented today for surgery, with the diagnosis of RIGHT KNEE MEDIAL MENISCUS TEAR.  The various methods of treatment have been discussed with the patient and family. After consideration of risks, benefits and other options for treatment, the patient has consented to  Procedure(s): RIGHT KNEE ARTHROSCOPY (Right) as a surgical intervention.  The patient's history has been reviewed, patient examined, no change in status, stable for surgery.  I have reviewed the patient's chart and labs.  Questions were answered to the patient's satisfaction.     Velna Ochs

## 2020-11-04 NOTE — Anesthesia Postprocedure Evaluation (Signed)
Anesthesia Post Note  Patient: Rebecca [name]  Procedure(s) Performed: RIGHT KNEE ARTHROSCOPY, CHONROPLASTY (Right: Knee) STEROID INJECTION AND ASPIRATION (Left: Knee)     Patient location during evaluation: PACU Anesthesia Type: General Level of consciousness: awake and alert and oriented Pain management: pain level controlled Vital Signs Assessment: post-procedure vital signs reviewed and stable Respiratory status: spontaneous breathing, nonlabored ventilation and respiratory function stable Cardiovascular status: blood pressure returned to baseline Postop Assessment: no apparent nausea or vomiting Anesthetic complications: no   No notable events documented.  Last Vitals:  Vitals:   11/04/20 1210 11/04/20 1215  BP: (!) 159/85 (!) 155/104  Pulse:  77  Resp:  15  Temp:    SpO2:  98%    Last Pain:  Vitals:   11/04/20 1225  TempSrc:   PainSc: 5                  Kaylyn Layer

## 2020-11-04 NOTE — Transfer of Care (Signed)
Immediate Anesthesia Transfer of Care Note  Patient: Rebecca [name]  Procedure(s) Performed: RIGHT KNEE ARTHROSCOPY, CHONROPLASTY (Right: Knee) STEROID INJECTION AND ASPIRATION (Left: Knee)  Patient Location: PACU  Anesthesia Type:General  Level of Consciousness: awake, alert , oriented and patient cooperative  Airway & Oxygen Therapy: Patient Spontanous Breathing and Patient connected to face mask oxygen  Post-op Assessment: Report given to RN and Post -op Vital signs reviewed and stable  Post vital signs: Reviewed and stable  Last Vitals:  Vitals Value Taken Time  BP 188/112 11/04/20 1150  Temp    Pulse 82 11/04/20 1150  Resp 15 11/04/20 1150  SpO2 100 % 11/04/20 1150  Vitals shown include unvalidated device data.  Last Pain:  Vitals:   11/04/20 0819  TempSrc: Oral         Complications: No notable events documented.

## 2020-11-04 NOTE — Anesthesia Procedure Notes (Signed)
Procedure Name: Intubation Date/Time: 11/04/2020 10:53 AM Performed by: Garrel Ridgel, CRNA Pre-anesthesia Checklist: Patient identified, Emergency Drugs available, Suction available and Patient being monitored Patient Re-evaluated:Patient Re-evaluated prior to induction Oxygen Delivery Method: Circle system utilized Preoxygenation: Pre-oxygenation with 100% oxygen Induction Type: IV induction Ventilation: Mask ventilation without difficulty Laryngoscope Size: Mac and 4 Grade View: Grade I Tube type: Oral Tube size: 7.0 mm Number of attempts: 1 Airway Equipment and Method: Stylet and Oral airway Placement Confirmation: ETT inserted through vocal cords under direct vision, positive ETCO2 and breath sounds checked- equal and bilateral Secured at: 22 cm Tube secured with: Tape Dental Injury: Teeth and Oropharynx as per pre-operative assessment

## 2020-11-05 ENCOUNTER — Encounter (HOSPITAL_COMMUNITY): Payer: Self-pay | Admitting: Orthopaedic Surgery

## 2020-11-10 DIAGNOSIS — M6281 Muscle weakness (generalized): Secondary | ICD-10-CM | POA: Diagnosis not present

## 2020-11-10 DIAGNOSIS — Z9889 Other specified postprocedural states: Secondary | ICD-10-CM | POA: Diagnosis not present

## 2020-11-10 DIAGNOSIS — R2689 Other abnormalities of gait and mobility: Secondary | ICD-10-CM | POA: Diagnosis not present

## 2020-11-13 DIAGNOSIS — Z9889 Other specified postprocedural states: Secondary | ICD-10-CM | POA: Diagnosis not present

## 2020-11-13 DIAGNOSIS — M6281 Muscle weakness (generalized): Secondary | ICD-10-CM | POA: Diagnosis not present

## 2020-11-13 DIAGNOSIS — R2689 Other abnormalities of gait and mobility: Secondary | ICD-10-CM | POA: Diagnosis not present

## 2020-11-19 DIAGNOSIS — M6281 Muscle weakness (generalized): Secondary | ICD-10-CM | POA: Diagnosis not present

## 2020-11-19 DIAGNOSIS — R2689 Other abnormalities of gait and mobility: Secondary | ICD-10-CM | POA: Diagnosis not present

## 2020-11-19 DIAGNOSIS — Z9889 Other specified postprocedural states: Secondary | ICD-10-CM | POA: Diagnosis not present

## 2020-11-21 DIAGNOSIS — Z9889 Other specified postprocedural states: Secondary | ICD-10-CM | POA: Diagnosis not present

## 2020-11-21 DIAGNOSIS — R2689 Other abnormalities of gait and mobility: Secondary | ICD-10-CM | POA: Diagnosis not present

## 2020-11-21 DIAGNOSIS — M6281 Muscle weakness (generalized): Secondary | ICD-10-CM | POA: Diagnosis not present

## 2020-11-26 DIAGNOSIS — R2689 Other abnormalities of gait and mobility: Secondary | ICD-10-CM | POA: Diagnosis not present

## 2020-11-26 DIAGNOSIS — M6281 Muscle weakness (generalized): Secondary | ICD-10-CM | POA: Diagnosis not present

## 2020-11-26 DIAGNOSIS — Z9889 Other specified postprocedural states: Secondary | ICD-10-CM | POA: Diagnosis not present

## 2020-11-27 ENCOUNTER — Ambulatory Visit: Payer: BC Managed Care – PPO | Admitting: Cardiology

## 2020-12-03 DIAGNOSIS — M6281 Muscle weakness (generalized): Secondary | ICD-10-CM | POA: Diagnosis not present

## 2020-12-03 DIAGNOSIS — R2689 Other abnormalities of gait and mobility: Secondary | ICD-10-CM | POA: Diagnosis not present

## 2020-12-03 DIAGNOSIS — Z9889 Other specified postprocedural states: Secondary | ICD-10-CM | POA: Diagnosis not present

## 2020-12-26 DIAGNOSIS — M6281 Muscle weakness (generalized): Secondary | ICD-10-CM | POA: Diagnosis not present

## 2020-12-26 DIAGNOSIS — R2689 Other abnormalities of gait and mobility: Secondary | ICD-10-CM | POA: Diagnosis not present

## 2020-12-26 DIAGNOSIS — Z9889 Other specified postprocedural states: Secondary | ICD-10-CM | POA: Diagnosis not present

## 2020-12-29 DIAGNOSIS — R2689 Other abnormalities of gait and mobility: Secondary | ICD-10-CM | POA: Diagnosis not present

## 2020-12-29 DIAGNOSIS — Z9889 Other specified postprocedural states: Secondary | ICD-10-CM | POA: Diagnosis not present

## 2020-12-29 DIAGNOSIS — M6281 Muscle weakness (generalized): Secondary | ICD-10-CM | POA: Diagnosis not present

## 2021-01-05 DIAGNOSIS — M6281 Muscle weakness (generalized): Secondary | ICD-10-CM | POA: Diagnosis not present

## 2021-01-05 DIAGNOSIS — Z9889 Other specified postprocedural states: Secondary | ICD-10-CM | POA: Diagnosis not present

## 2021-01-05 DIAGNOSIS — R2689 Other abnormalities of gait and mobility: Secondary | ICD-10-CM | POA: Diagnosis not present

## 2021-02-05 DIAGNOSIS — F411 Generalized anxiety disorder: Secondary | ICD-10-CM | POA: Diagnosis not present

## 2021-03-13 DIAGNOSIS — F411 Generalized anxiety disorder: Secondary | ICD-10-CM | POA: Diagnosis not present

## 2021-03-27 DIAGNOSIS — F411 Generalized anxiety disorder: Secondary | ICD-10-CM | POA: Diagnosis not present

## 2021-04-09 ENCOUNTER — Other Ambulatory Visit: Payer: Self-pay

## 2021-04-09 ENCOUNTER — Emergency Department (HOSPITAL_BASED_OUTPATIENT_CLINIC_OR_DEPARTMENT_OTHER)
Admission: EM | Admit: 2021-04-09 | Discharge: 2021-04-09 | Disposition: A | Discharge status: Home / Self Care | Payer: BC Managed Care – PPO | Attending: Emergency Medicine | Admitting: Emergency Medicine

## 2021-04-09 ENCOUNTER — Encounter (HOSPITAL_BASED_OUTPATIENT_CLINIC_OR_DEPARTMENT_OTHER): Payer: Self-pay | Admitting: Obstetrics and Gynecology

## 2021-04-09 ENCOUNTER — Emergency Department (HOSPITAL_BASED_OUTPATIENT_CLINIC_OR_DEPARTMENT_OTHER): Payer: BC Managed Care – PPO | Admitting: Radiology

## 2021-04-09 DIAGNOSIS — J40 Bronchitis, not specified as acute or chronic: Secondary | ICD-10-CM | POA: Insufficient documentation

## 2021-04-09 DIAGNOSIS — R0789 Other chest pain: Secondary | ICD-10-CM

## 2021-04-09 DIAGNOSIS — J0100 Acute maxillary sinusitis, unspecified: Secondary | ICD-10-CM | POA: Diagnosis not present

## 2021-04-09 DIAGNOSIS — R079 Chest pain, unspecified: Secondary | ICD-10-CM | POA: Diagnosis not present

## 2021-04-09 DIAGNOSIS — R6889 Other general symptoms and signs: Secondary | ICD-10-CM

## 2021-04-09 DIAGNOSIS — R0602 Shortness of breath: Secondary | ICD-10-CM | POA: Diagnosis not present

## 2021-04-09 DIAGNOSIS — I10 Essential (primary) hypertension: Secondary | ICD-10-CM | POA: Diagnosis not present

## 2021-04-09 DIAGNOSIS — R739 Hyperglycemia, unspecified: Secondary | ICD-10-CM | POA: Insufficient documentation

## 2021-04-09 DIAGNOSIS — Z79899 Other long term (current) drug therapy: Secondary | ICD-10-CM | POA: Insufficient documentation

## 2021-04-09 DIAGNOSIS — Z7982 Long term (current) use of aspirin: Secondary | ICD-10-CM | POA: Insufficient documentation

## 2021-04-09 DIAGNOSIS — E039 Hypothyroidism, unspecified: Secondary | ICD-10-CM | POA: Insufficient documentation

## 2021-04-09 DIAGNOSIS — Z20822 Contact with and (suspected) exposure to covid-19: Secondary | ICD-10-CM | POA: Insufficient documentation

## 2021-04-09 DIAGNOSIS — J111 Influenza due to unidentified influenza virus with other respiratory manifestations: Secondary | ICD-10-CM | POA: Diagnosis not present

## 2021-04-09 LAB — RESP PANEL BY RT-PCR (FLU A&B, COVID) ARPGX2
Influenza A by PCR: NEGATIVE
Influenza B by PCR: NEGATIVE
SARS Coronavirus 2 by RT PCR: NEGATIVE

## 2021-04-09 LAB — BASIC METABOLIC PANEL
Anion gap: 12 (ref 5–15)
BUN: 15 mg/dL (ref 6–20)
CO2: 24 mmol/L (ref 22–32)
Calcium: 9.4 mg/dL (ref 8.9–10.3)
Chloride: 102 mmol/L (ref 98–111)
Creatinine, Ser: 0.89 mg/dL (ref 0.44–1.00)
GFR, Estimated: >60 mL/min (ref >60)
Glucose, Bld: 198 mg/dL — ABNORMAL HIGH (ref 70–99)
Potassium: 4 mmol/L (ref 3.5–5.1)
Sodium: 138 mmol/L (ref 135–145)

## 2021-04-09 LAB — CBC
HCT: 47.1 % — ABNORMAL HIGH (ref 36.0–46.0)
Hemoglobin: 16 g/dL — ABNORMAL HIGH (ref 12.0–15.0)
MCH: 31.1 pg (ref 26.0–34.0)
MCHC: 34 g/dL (ref 30.0–36.0)
MCV: 91.6 fL (ref 80.0–100.0)
Platelets: 336 10*3/uL (ref 150–400)
RBC: 5.14 MIL/uL — ABNORMAL HIGH (ref 3.87–5.11)
RDW: 12.8 % (ref 11.5–15.5)
WBC: 10.8 10*3/uL — ABNORMAL HIGH (ref 4.0–10.5)
nRBC: 0 % (ref 0.0–0.2)

## 2021-04-09 LAB — TROPONIN I (HIGH SENSITIVITY)
Troponin I (High Sensitivity): <2 ng/L (ref <18)
Troponin I (High Sensitivity): 2 ng/L (ref <18)

## 2021-04-09 LAB — PREGNANCY, URINE: Preg Test, Ur: NEGATIVE

## 2021-04-09 MED ORDER — AZITHROMYCIN 250 MG PO TABS
500.0000 mg | ORAL_TABLET | Freq: Once | ORAL | Status: AC
  Administered 2021-04-09: 500 mg via ORAL
  Filled 2021-04-09: qty 2

## 2021-04-09 MED ORDER — AEROCHAMBER PLUS FLO-VU MISC
1.0000 | Freq: Once | Status: AC
  Administered 2021-04-09: 1
  Filled 2021-04-09: qty 1

## 2021-04-09 MED ORDER — ALBUTEROL SULFATE HFA 108 (90 BASE) MCG/ACT IN AERS
2.0000 | INHALATION_SPRAY | Freq: Four times a day (QID) | RESPIRATORY_TRACT | Status: DC | PRN
  Administered 2021-04-09: 2 via RESPIRATORY_TRACT

## 2021-04-09 MED ORDER — ALBUTEROL SULFATE HFA 108 (90 BASE) MCG/ACT IN AERS
2.0000 | INHALATION_SPRAY | Freq: Four times a day (QID) | RESPIRATORY_TRACT | Status: DC
  Filled 2021-04-09: qty 6.7

## 2021-04-09 MED ORDER — AZITHROMYCIN 250 MG PO TABS: 250.0000 mg | ORAL_TABLET | Freq: Every day | ORAL | 0 refills | Status: DC

## 2021-04-09 NOTE — ED Notes (Signed)
RT educated pt on proper use of MDI w/spacer. Pt able to perform w/out difficulty. Pt verbalizes understanding of instructions and dosing. RT will continue to monitor.

## 2021-04-09 NOTE — Discharge Instructions (Addendum)
Tinea Mucinex DM.  Keep your appointment to follow-up with your primary care doctor on January 11 to have blood sugar rechecked.  Chest x-ray and heart testing all negative today.  COVID and flu testing negative as well.  Take the azithromycin antibiotic as directed for the bronchitis in the sinusitis.  Use the albuterol inhaler 2 puffs every 6 hours for the next 7 days.  To help suppress the cough and keep your lungs open.  Return for any new or worse symptoms.

## 2021-04-09 NOTE — ED Provider Notes (Signed)
[facility] EMERGENCY DEPT Provider Note   CSN: [id number] Arrival date & time: 04/09/21  1500     History  Chief Complaint  Patient presents with   Chest Pain    Rebecca [name] is a 51 y.o. female.  Patient now a several week history of a upper respiratory infection with persistent cough productive at times.  Associated with some intermittent shortness of breath.  There is some pain with coughing.  And there is a left lateral chest pain.  With coughing.  Oxygen saturations are good they are 94% or better.  No fevers.  Respiratory rate 18 blood pressure 127/89.  She has been testing at home and has been COVID-negative.  Patient without fevers recently.  Patient without any nausea or vomiting.  Patient does say there is some left maxillary sinus pressure and pain.   Past medical history is significant for hypothyroidism hypertension obesity.  Patient is non-smoker.      Home Medications Prior to Admission medications   Medication Sig Start Date End Date Taking? Authorizing Provider  azithromycin (ZITHROMAX) 250 MG tablet Take 1 tablet (250 mg total) by mouth daily. Take first 2 tablets together, then 1 every day until finished. 04/09/21  Yes Fredia Sorrow, MD  amphetamine-dextroamphetamine (ADDERALL) 20 MG tablet Take 20 mg by mouth 2 (two) times daily.    [provider]  aspirin-acetaminophen-caffeine (EXCEDRIN MIGRAINE) [phone] MG tablet Take 2 tablets by mouth every 6 (six) hours as needed for headache.    [provider]  clotrimazole-betamethasone (LOTRISONE) cream Apply 1 application topically 2 (two) times daily as needed (rash). 04/18/19   [provider]  diphenhydrAMINE (SOMINEX) 25 MG tablet Take 25 mg by mouth at bedtime as needed for sleep.    [provider]  hydrochlorothiazide (MICROZIDE) 12.5 MG capsule Take 12.5 mg by mouth daily. 10/13/20   [provider]  HYDROcodone-acetaminophen (NORCO/VICODIN) 5-325 MG  tablet Take 1-2 tablets by mouth every 6 (six) hours as needed for moderate pain or severe pain (post op pain. Take with food.). 11/04/20 11/04/21  Loni Dolly, PA-C  ibuprofen (ADVIL) 200 MG tablet Take 800 mg by mouth 2 (two) times daily as needed for headache or moderate pain.    [provider]  levonorgestrel (MIRENA) 20 MCG/24HR IUD 1 each by Intrauterine route once.    [provider]  metoprolol succinate (TOPROL-XL) 50 MG 24 hr tablet TAKE 1 TABLET BY MOUTH DAILY Patient taking differently: Take 50 mg by mouth daily. 09/09/20   Revankar, Reita Cliche, MD  SYNTHROID 200 MCG tablet Take 200 mcg by mouth daily. 07/26/19   [provider]  valACYclovir (VALTREX) 1000 MG tablet Take 1,000 mg by mouth daily as needed (outbreaks). 04/28/20   [provider]      Allergies    Patient has no known allergies.    Review of Systems   Review of Systems  Constitutional:  Negative for chills and fever.  HENT:  Positive for congestion, sinus pressure and sinus pain. Negative for ear pain and sore throat.   Eyes:  Negative for pain and visual disturbance.  Respiratory:  Positive for cough and shortness of breath.   Cardiovascular:  Positive for chest pain. Negative for palpitations.  Gastrointestinal:  Negative for abdominal pain and vomiting.  Genitourinary:  Negative for dysuria and hematuria.  Musculoskeletal:  Negative for arthralgias and back pain.  Skin:  Negative for color change and rash.  Neurological:  Negative for seizures and syncope.  All other systems reviewed and are negative.  Physical Exam Updated Vital Signs BP 114/83    Pulse 93    Temp 98.9 F (37.2 C) (Oral)    Resp 18    Ht 1.651 m (5\' 5" )    Wt (!) 162 kg    SpO2 96%    BMI 59.43 kg/m  Physical Exam Vitals and nursing note reviewed.  Constitutional:      General: She is not in acute distress.    Appearance: Normal appearance. She is well-developed. She is obese. She is not ill-appearing,  toxic-appearing or diaphoretic.  HENT:     Head: Normocephalic and atraumatic.     Comments: Is to palpation left maxillary sinus. Eyes:     Extraocular Movements: Extraocular movements intact.     Conjunctiva/sclera: Conjunctivae normal.     Pupils: Pupils are equal, round, and reactive to light.  Cardiovascular:     Rate and Rhythm: Normal rate and regular rhythm.     Heart sounds: No murmur heard. Pulmonary:     Effort: Pulmonary effort is normal. No respiratory distress.     Breath sounds: Rhonchi present. No wheezing or rales.  Abdominal:     Palpations: Abdomen is soft.     Tenderness: There is no abdominal tenderness.  Musculoskeletal:        General: No swelling.     Cervical back: Normal range of motion and neck supple.  Skin:    General: Skin is warm and dry.     Capillary Refill: Capillary refill takes less than 2 seconds.  Neurological:     General: No focal deficit present.     Mental Status: She is alert and oriented to person, place, and time.  Psychiatric:        Mood and Affect: Mood normal.    ED Results / Procedures / Treatments   Labs (all labs ordered are listed, but only abnormal results are displayed) Labs Reviewed  BASIC METABOLIC PANEL - Abnormal; Notable for the following components:      Result Value   Glucose, Bld 198 (*)    All other components within normal limits  CBC - Abnormal; Notable for the following components:   WBC 10.8 (*)    RBC 5.14 (*)    Hemoglobin 16.0 (*)    HCT 47.1 (*)    All other components within normal limits  RESP PANEL BY RT-PCR (FLU A&B, COVID) ARPGX2  PREGNANCY, URINE  TROPONIN I (HIGH SENSITIVITY)  TROPONIN I (HIGH SENSITIVITY)    EKG EKG Interpretation  Date/Time:  Thursday April 09 2021 15:20:30 EST Ventricular Rate:  96 PR Interval:  148 QRS Duration: 84 QT Interval:  368 QTC Calculation: 464 R Axis:   45 Text Interpretation: Normal sinus rhythm Nonspecific ST abnormality Abnormal ECG When  compared with ECG of 28-Oct-2020 08:29, No significant change was found Confirmed by Fredia Sorrow [phone]) on 04/09/2021 10:31:45 PM  Radiology DG Chest 2 View  Result Date: 04/09/2021 CLINICAL DATA:  Chest pain EXAM: CHEST - 2 VIEW COMPARISON:  Chest XR, 11/02/2019.  CT chest, 05/16/2019. FINDINGS: Cardiomediastinal silhouette is within normal limits. Lungs are well inflated. No focal consolidation or mass. No pleural effusion or pneumothorax. No acute displaced fracture. IMPRESSION: Normal chest. Electronically Signed   By: Michaelle Birks M.D.   On: 04/09/2021 15:43    Procedures Procedures    Medications Ordered in ED Medications  albuterol (VENTOLIN HFA) 108 (90 Base) MCG/ACT inhaler 2 puff (has no administration  in time range)  azithromycin (ZITHROMAX) tablet 500 mg (has no administration in time range)    ED Course/ Medical Decision Making/ A&P                           Medical Decision Making Patient nontoxic no acute distress.  Vital signs very stable.  COVID flu testing negative.  Pregnancy test negative.  Troponins x2 very normal.  Basic metabolic panel significant for blood sugar of 198.  Patient states she has had some borderline blood sugars in the past and is followed carefully by her primary care doctor.  Renal functions normal.  Mild leukocytosis.  White blood cell count is 10.8.  Hemoglobin 16.  Chest x-ray without any acute findings.  AG without any acute findings.  Patient's pain is more right lateral chest wall pain.  Probably due to the cough.  No signs of pneumonia.  Patient symptoms seem to be consistent with a persistent upper respiratory infection.  COVID flu negative.  Suggestive of bronchitis chest wall pain secondary to cough.  Some left maxillary sinusitis.  Will treat with albuterol inhaler and have her continue her Mucinex DM.  We will treat her with Z-Pak as well for the bronchitis and may be the sinus infection.  Have her follow-up with her primary care  doctor for blood sugar check on January 11 which she already has an appointment at that time.  No evidence of any acute cardiac event no evidence of any acute pulmonary process.  Clinically not concerned about pulmonary embolus.  Final Clinical Impression(s) / ED Diagnoses Final diagnoses:  Chest wall pain  Acute maxillary sinusitis, recurrence not specified  Bronchitis  Flu-like symptoms  Hyperglycemia    Rx / DC Orders ED Discharge Orders          Ordered    azithromycin (ZITHROMAX) 250 MG tablet  Daily        04/09/21 2251              Fredia Sorrow, MD 04/09/21 2300

## 2021-04-09 NOTE — ED Triage Notes (Signed)
Patient reports to the ER for chest pain that she reports started last night. Patient reports a recent cold in the past x2 weeks.

## 2021-04-10 DIAGNOSIS — F411 Generalized anxiety disorder: Secondary | ICD-10-CM | POA: Diagnosis not present

## 2021-04-15 DIAGNOSIS — R7309 Other abnormal glucose: Secondary | ICD-10-CM | POA: Diagnosis not present

## 2021-04-15 DIAGNOSIS — R7303 Prediabetes: Secondary | ICD-10-CM | POA: Diagnosis not present

## 2021-04-15 DIAGNOSIS — E039 Hypothyroidism, unspecified: Secondary | ICD-10-CM | POA: Diagnosis not present

## 2021-04-15 DIAGNOSIS — F900 Attention-deficit hyperactivity disorder, predominantly inattentive type: Secondary | ICD-10-CM | POA: Diagnosis not present

## 2021-04-15 DIAGNOSIS — E785 Hyperlipidemia, unspecified: Secondary | ICD-10-CM | POA: Diagnosis not present

## 2021-04-24 DIAGNOSIS — F411 Generalized anxiety disorder: Secondary | ICD-10-CM | POA: Diagnosis not present

## 2021-05-08 DIAGNOSIS — F411 Generalized anxiety disorder: Secondary | ICD-10-CM | POA: Diagnosis not present

## 2021-05-22 DIAGNOSIS — F411 Generalized anxiety disorder: Secondary | ICD-10-CM | POA: Diagnosis not present

## 2021-06-05 DIAGNOSIS — F411 Generalized anxiety disorder: Secondary | ICD-10-CM | POA: Diagnosis not present

## 2021-06-19 DIAGNOSIS — F411 Generalized anxiety disorder: Secondary | ICD-10-CM | POA: Diagnosis not present

## 2021-07-01 DIAGNOSIS — Z01419 Encounter for gynecological examination (general) (routine) without abnormal findings: Secondary | ICD-10-CM | POA: Diagnosis not present

## 2021-07-01 DIAGNOSIS — M7989 Other specified soft tissue disorders: Secondary | ICD-10-CM | POA: Diagnosis not present

## 2021-07-01 DIAGNOSIS — Z30431 Encounter for routine checking of intrauterine contraceptive device: Secondary | ICD-10-CM | POA: Diagnosis not present

## 2021-07-02 DIAGNOSIS — Z1231 Encounter for screening mammogram for malignant neoplasm of breast: Secondary | ICD-10-CM | POA: Diagnosis not present

## 2021-07-03 DIAGNOSIS — F411 Generalized anxiety disorder: Secondary | ICD-10-CM | POA: Diagnosis not present

## 2021-07-07 ENCOUNTER — Other Ambulatory Visit: Payer: Self-pay | Admitting: Nurse Practitioner

## 2021-07-07 DIAGNOSIS — M7989 Other specified soft tissue disorders: Secondary | ICD-10-CM

## 2021-09-18 DIAGNOSIS — I10 Essential (primary) hypertension: Secondary | ICD-10-CM | POA: Diagnosis not present

## 2021-09-18 DIAGNOSIS — F900 Attention-deficit hyperactivity disorder, predominantly inattentive type: Secondary | ICD-10-CM | POA: Diagnosis not present

## 2021-09-18 DIAGNOSIS — R300 Dysuria: Secondary | ICD-10-CM | POA: Diagnosis not present

## 2021-09-18 DIAGNOSIS — E039 Hypothyroidism, unspecified: Secondary | ICD-10-CM | POA: Diagnosis not present

## 2021-09-18 DIAGNOSIS — R7309 Other abnormal glucose: Secondary | ICD-10-CM | POA: Diagnosis not present

## 2021-09-28 DIAGNOSIS — R7303 Prediabetes: Secondary | ICD-10-CM | POA: Diagnosis not present

## 2022-01-05 DIAGNOSIS — H6501 Acute serous otitis media, right ear: Secondary | ICD-10-CM | POA: Diagnosis not present

## 2022-01-05 DIAGNOSIS — R110 Nausea: Secondary | ICD-10-CM | POA: Diagnosis not present

## 2022-01-05 DIAGNOSIS — M26601 Right temporomandibular joint disorder, unspecified: Secondary | ICD-10-CM | POA: Diagnosis not present

## 2022-01-20 DIAGNOSIS — R7309 Other abnormal glucose: Secondary | ICD-10-CM | POA: Diagnosis not present

## 2022-01-20 DIAGNOSIS — F900 Attention-deficit hyperactivity disorder, predominantly inattentive type: Secondary | ICD-10-CM | POA: Diagnosis not present

## 2022-01-20 DIAGNOSIS — I10 Essential (primary) hypertension: Secondary | ICD-10-CM | POA: Diagnosis not present

## 2022-01-20 DIAGNOSIS — E039 Hypothyroidism, unspecified: Secondary | ICD-10-CM | POA: Diagnosis not present

## 2022-04-08 DIAGNOSIS — N951 Menopausal and female climacteric states: Secondary | ICD-10-CM | POA: Diagnosis not present

## 2022-04-08 DIAGNOSIS — Z13220 Encounter for screening for lipoid disorders: Secondary | ICD-10-CM | POA: Diagnosis not present

## 2022-04-08 DIAGNOSIS — R7303 Prediabetes: Secondary | ICD-10-CM | POA: Diagnosis not present

## 2022-04-08 DIAGNOSIS — Z130 Encounter for screening for diseases of the blood and blood-forming organs and certain disorders involving the immune mechanism: Secondary | ICD-10-CM | POA: Diagnosis not present

## 2022-04-08 DIAGNOSIS — E559 Vitamin D deficiency, unspecified: Secondary | ICD-10-CM | POA: Diagnosis not present

## 2022-04-08 DIAGNOSIS — Z1329 Encounter for screening for other suspected endocrine disorder: Secondary | ICD-10-CM | POA: Diagnosis not present

## 2022-04-15 DIAGNOSIS — Z1339 Encounter for screening examination for other mental health and behavioral disorders: Secondary | ICD-10-CM | POA: Diagnosis not present

## 2022-04-15 DIAGNOSIS — E559 Vitamin D deficiency, unspecified: Secondary | ICD-10-CM | POA: Diagnosis not present

## 2022-04-15 DIAGNOSIS — N951 Menopausal and female climacteric states: Secondary | ICD-10-CM | POA: Diagnosis not present

## 2022-04-15 DIAGNOSIS — R7303 Prediabetes: Secondary | ICD-10-CM | POA: Diagnosis not present

## 2022-04-15 DIAGNOSIS — Z1331 Encounter for screening for depression: Secondary | ICD-10-CM | POA: Diagnosis not present

## 2022-04-22 DIAGNOSIS — E039 Hypothyroidism, unspecified: Secondary | ICD-10-CM | POA: Diagnosis not present

## 2022-04-29 DIAGNOSIS — E7800 Pure hypercholesterolemia, unspecified: Secondary | ICD-10-CM | POA: Diagnosis not present

## 2022-04-29 DIAGNOSIS — Z6841 Body Mass Index (BMI) 40.0 and over, adult: Secondary | ICD-10-CM | POA: Diagnosis not present

## 2022-04-29 DIAGNOSIS — N941 Unspecified dyspareunia: Secondary | ICD-10-CM | POA: Diagnosis not present

## 2022-05-05 DIAGNOSIS — F900 Attention-deficit hyperactivity disorder, predominantly inattentive type: Secondary | ICD-10-CM | POA: Diagnosis not present

## 2022-05-05 DIAGNOSIS — I10 Essential (primary) hypertension: Secondary | ICD-10-CM | POA: Diagnosis not present

## 2022-05-05 DIAGNOSIS — E039 Hypothyroidism, unspecified: Secondary | ICD-10-CM | POA: Diagnosis not present

## 2022-05-05 DIAGNOSIS — R7309 Other abnormal glucose: Secondary | ICD-10-CM | POA: Diagnosis not present

## 2022-05-07 DIAGNOSIS — Z6841 Body Mass Index (BMI) 40.0 and over, adult: Secondary | ICD-10-CM | POA: Diagnosis not present

## 2022-05-07 DIAGNOSIS — E559 Vitamin D deficiency, unspecified: Secondary | ICD-10-CM | POA: Diagnosis not present

## 2022-05-13 DIAGNOSIS — R7303 Prediabetes: Secondary | ICD-10-CM | POA: Diagnosis not present

## 2022-05-13 DIAGNOSIS — Z6841 Body Mass Index (BMI) 40.0 and over, adult: Secondary | ICD-10-CM | POA: Diagnosis not present

## 2022-05-24 DIAGNOSIS — E039 Hypothyroidism, unspecified: Secondary | ICD-10-CM | POA: Diagnosis not present

## 2022-05-24 DIAGNOSIS — Z6841 Body Mass Index (BMI) 40.0 and over, adult: Secondary | ICD-10-CM | POA: Diagnosis not present

## 2022-06-07 DIAGNOSIS — Z6841 Body Mass Index (BMI) 40.0 and over, adult: Secondary | ICD-10-CM | POA: Diagnosis not present

## 2022-06-07 DIAGNOSIS — E7800 Pure hypercholesterolemia, unspecified: Secondary | ICD-10-CM | POA: Diagnosis not present

## 2022-06-07 DIAGNOSIS — E559 Vitamin D deficiency, unspecified: Secondary | ICD-10-CM | POA: Diagnosis not present

## 2022-06-07 DIAGNOSIS — R5382 Chronic fatigue, unspecified: Secondary | ICD-10-CM | POA: Diagnosis not present

## 2022-06-17 DIAGNOSIS — Z6841 Body Mass Index (BMI) 40.0 and over, adult: Secondary | ICD-10-CM | POA: Diagnosis not present

## 2022-06-17 DIAGNOSIS — R7303 Prediabetes: Secondary | ICD-10-CM | POA: Diagnosis not present

## 2022-06-24 DIAGNOSIS — Z6841 Body Mass Index (BMI) 40.0 and over, adult: Secondary | ICD-10-CM | POA: Diagnosis not present

## 2022-06-24 DIAGNOSIS — R300 Dysuria: Secondary | ICD-10-CM | POA: Diagnosis not present

## 2022-06-24 DIAGNOSIS — E559 Vitamin D deficiency, unspecified: Secondary | ICD-10-CM | POA: Diagnosis not present

## 2022-07-01 DIAGNOSIS — Z6841 Body Mass Index (BMI) 40.0 and over, adult: Secondary | ICD-10-CM | POA: Diagnosis not present

## 2022-07-01 DIAGNOSIS — R7303 Prediabetes: Secondary | ICD-10-CM | POA: Diagnosis not present

## 2022-07-08 DIAGNOSIS — Z1231 Encounter for screening mammogram for malignant neoplasm of breast: Secondary | ICD-10-CM | POA: Diagnosis not present

## 2022-07-14 DIAGNOSIS — N6325 Unspecified lump in the left breast, overlapping quadrants: Secondary | ICD-10-CM | POA: Diagnosis not present

## 2022-07-14 DIAGNOSIS — R922 Inconclusive mammogram: Secondary | ICD-10-CM | POA: Diagnosis not present

## 2022-07-16 DIAGNOSIS — Z6841 Body Mass Index (BMI) 40.0 and over, adult: Secondary | ICD-10-CM | POA: Diagnosis not present

## 2022-07-16 DIAGNOSIS — R7303 Prediabetes: Secondary | ICD-10-CM | POA: Diagnosis not present

## 2022-07-20 ENCOUNTER — Other Ambulatory Visit: Payer: Self-pay

## 2022-07-20 DIAGNOSIS — N6012 Diffuse cystic mastopathy of left breast: Secondary | ICD-10-CM | POA: Diagnosis not present

## 2022-07-20 DIAGNOSIS — R922 Inconclusive mammogram: Secondary | ICD-10-CM | POA: Diagnosis not present

## 2022-07-22 DIAGNOSIS — E7800 Pure hypercholesterolemia, unspecified: Secondary | ICD-10-CM | POA: Diagnosis not present

## 2022-07-22 DIAGNOSIS — Z6841 Body Mass Index (BMI) 40.0 and over, adult: Secondary | ICD-10-CM | POA: Diagnosis not present

## 2022-07-29 DIAGNOSIS — K760 Fatty (change of) liver, not elsewhere classified: Secondary | ICD-10-CM | POA: Diagnosis not present

## 2022-07-29 DIAGNOSIS — Z6841 Body Mass Index (BMI) 40.0 and over, adult: Secondary | ICD-10-CM | POA: Diagnosis not present

## 2022-08-06 DIAGNOSIS — R7303 Prediabetes: Secondary | ICD-10-CM | POA: Diagnosis not present

## 2022-08-06 DIAGNOSIS — Z6841 Body Mass Index (BMI) 40.0 and over, adult: Secondary | ICD-10-CM | POA: Diagnosis not present

## 2022-08-06 DIAGNOSIS — E039 Hypothyroidism, unspecified: Secondary | ICD-10-CM | POA: Diagnosis not present

## 2022-08-09 ENCOUNTER — Other Ambulatory Visit (HOSPITAL_COMMUNITY): Payer: Self-pay | Admitting: Family Medicine

## 2022-08-09 DIAGNOSIS — E785 Hyperlipidemia, unspecified: Secondary | ICD-10-CM | POA: Diagnosis not present

## 2022-08-09 DIAGNOSIS — I10 Essential (primary) hypertension: Secondary | ICD-10-CM | POA: Diagnosis not present

## 2022-08-09 DIAGNOSIS — E039 Hypothyroidism, unspecified: Secondary | ICD-10-CM | POA: Diagnosis not present

## 2022-08-09 DIAGNOSIS — F900 Attention-deficit hyperactivity disorder, predominantly inattentive type: Secondary | ICD-10-CM | POA: Diagnosis not present

## 2022-08-12 DIAGNOSIS — A6000 Herpesviral infection of urogenital system, unspecified: Secondary | ICD-10-CM | POA: Diagnosis not present

## 2022-08-12 DIAGNOSIS — Z01419 Encounter for gynecological examination (general) (routine) without abnormal findings: Secondary | ICD-10-CM | POA: Diagnosis not present

## 2022-08-12 DIAGNOSIS — N958 Other specified menopausal and perimenopausal disorders: Secondary | ICD-10-CM | POA: Diagnosis not present

## 2022-08-13 DIAGNOSIS — E7800 Pure hypercholesterolemia, unspecified: Secondary | ICD-10-CM | POA: Diagnosis not present

## 2022-08-13 DIAGNOSIS — Z6841 Body Mass Index (BMI) 40.0 and over, adult: Secondary | ICD-10-CM | POA: Diagnosis not present

## 2022-08-20 DIAGNOSIS — Z6841 Body Mass Index (BMI) 40.0 and over, adult: Secondary | ICD-10-CM | POA: Diagnosis not present

## 2022-08-20 DIAGNOSIS — E7800 Pure hypercholesterolemia, unspecified: Secondary | ICD-10-CM | POA: Diagnosis not present

## 2022-08-26 DIAGNOSIS — L720 Epidermal cyst: Secondary | ICD-10-CM | POA: Diagnosis not present

## 2022-08-26 DIAGNOSIS — L821 Other seborrheic keratosis: Secondary | ICD-10-CM | POA: Diagnosis not present

## 2022-08-26 DIAGNOSIS — L718 Other rosacea: Secondary | ICD-10-CM | POA: Diagnosis not present

## 2022-08-26 DIAGNOSIS — D1801 Hemangioma of skin and subcutaneous tissue: Secondary | ICD-10-CM | POA: Diagnosis not present

## 2022-09-03 DIAGNOSIS — E039 Hypothyroidism, unspecified: Secondary | ICD-10-CM | POA: Diagnosis not present

## 2022-09-03 DIAGNOSIS — Z6841 Body Mass Index (BMI) 40.0 and over, adult: Secondary | ICD-10-CM | POA: Diagnosis not present

## 2022-09-15 DIAGNOSIS — U071 COVID-19: Secondary | ICD-10-CM | POA: Diagnosis not present

## 2022-11-25 DIAGNOSIS — I10 Essential (primary) hypertension: Secondary | ICD-10-CM | POA: Diagnosis not present

## 2022-11-25 DIAGNOSIS — E039 Hypothyroidism, unspecified: Secondary | ICD-10-CM | POA: Diagnosis not present

## 2022-11-25 DIAGNOSIS — E785 Hyperlipidemia, unspecified: Secondary | ICD-10-CM | POA: Diagnosis not present

## 2022-11-25 DIAGNOSIS — E119 Type 2 diabetes mellitus without complications: Secondary | ICD-10-CM | POA: Diagnosis not present

## 2022-11-25 DIAGNOSIS — F900 Attention-deficit hyperactivity disorder, predominantly inattentive type: Secondary | ICD-10-CM | POA: Diagnosis not present

## 2023-02-25 DIAGNOSIS — E039 Hypothyroidism, unspecified: Secondary | ICD-10-CM | POA: Diagnosis not present

## 2023-02-25 DIAGNOSIS — F900 Attention-deficit hyperactivity disorder, predominantly inattentive type: Secondary | ICD-10-CM | POA: Diagnosis not present

## 2023-02-25 DIAGNOSIS — Z6841 Body Mass Index (BMI) 40.0 and over, adult: Secondary | ICD-10-CM | POA: Diagnosis not present

## 2023-02-25 DIAGNOSIS — E119 Type 2 diabetes mellitus without complications: Secondary | ICD-10-CM | POA: Diagnosis not present

## 2023-03-24 DIAGNOSIS — E039 Hypothyroidism, unspecified: Secondary | ICD-10-CM | POA: Diagnosis not present

## 2023-03-24 DIAGNOSIS — E119 Type 2 diabetes mellitus without complications: Secondary | ICD-10-CM | POA: Diagnosis not present

## 2023-06-07 DIAGNOSIS — I10 Essential (primary) hypertension: Secondary | ICD-10-CM | POA: Diagnosis not present

## 2023-06-07 DIAGNOSIS — E039 Hypothyroidism, unspecified: Secondary | ICD-10-CM | POA: Diagnosis not present

## 2023-06-07 DIAGNOSIS — E785 Hyperlipidemia, unspecified: Secondary | ICD-10-CM | POA: Diagnosis not present

## 2023-06-07 DIAGNOSIS — F900 Attention-deficit hyperactivity disorder, predominantly inattentive type: Secondary | ICD-10-CM | POA: Diagnosis not present

## 2023-07-14 DIAGNOSIS — Z1231 Encounter for screening mammogram for malignant neoplasm of breast: Secondary | ICD-10-CM | POA: Diagnosis not present

## 2023-07-20 DIAGNOSIS — Z1211 Encounter for screening for malignant neoplasm of colon: Secondary | ICD-10-CM | POA: Diagnosis not present

## 2023-07-25 DIAGNOSIS — E039 Hypothyroidism, unspecified: Secondary | ICD-10-CM | POA: Diagnosis not present

## 2023-08-16 DIAGNOSIS — Z124 Encounter for screening for malignant neoplasm of cervix: Secondary | ICD-10-CM | POA: Diagnosis not present

## 2023-08-16 DIAGNOSIS — L304 Erythema intertrigo: Secondary | ICD-10-CM | POA: Diagnosis not present

## 2023-08-16 DIAGNOSIS — Z30431 Encounter for routine checking of intrauterine contraceptive device: Secondary | ICD-10-CM | POA: Diagnosis not present

## 2023-08-16 DIAGNOSIS — N958 Other specified menopausal and perimenopausal disorders: Secondary | ICD-10-CM | POA: Diagnosis not present

## 2023-08-16 DIAGNOSIS — Z01419 Encounter for gynecological examination (general) (routine) without abnormal findings: Secondary | ICD-10-CM | POA: Diagnosis not present

## 2023-08-23 ENCOUNTER — Other Ambulatory Visit: Payer: Self-pay | Admitting: Gastroenterology

## 2023-09-08 DIAGNOSIS — E119 Type 2 diabetes mellitus without complications: Secondary | ICD-10-CM | POA: Diagnosis not present

## 2023-09-08 DIAGNOSIS — F900 Attention-deficit hyperactivity disorder, predominantly inattentive type: Secondary | ICD-10-CM | POA: Diagnosis not present

## 2023-09-08 DIAGNOSIS — E039 Hypothyroidism, unspecified: Secondary | ICD-10-CM | POA: Diagnosis not present

## 2023-09-08 DIAGNOSIS — I10 Essential (primary) hypertension: Secondary | ICD-10-CM | POA: Diagnosis not present

## 2023-09-09 ENCOUNTER — Encounter (HOSPITAL_COMMUNITY): Payer: Self-pay | Admitting: Gastroenterology

## 2023-09-15 NOTE — Anesthesia Preprocedure Evaluation (Signed)
 Anesthesia Evaluation  Patient identified by MRN, date of birth, ID band Patient awake    Reviewed: Allergy & Precautions, NPO status , Patient's Chart, lab work & pertinent test results, reviewed documented beta blocker date and time   History of Anesthesia Complications Negative for: history of anesthetic complications  Airway Mallampati: II  TM Distance: >3 FB Neck ROM: Full    Dental  (+) Chipped,    Pulmonary neg pulmonary ROS   Pulmonary exam normal        Cardiovascular hypertension, Pt. on home beta blockers and Pt. on medications Normal cardiovascular exam     Neuro/Psych neg Seizures       ADHD   GI/Hepatic negative GI ROS, Neg liver ROS,,,  Endo/Other  Hypothyroidism  Class 3 obesity (BMI 59)  Renal/GU negative Renal ROS  negative genitourinary   Musculoskeletal  (+) Arthritis ,  RIGHT KNEE MEDIAL MENISCUS TEAR   Abdominal   Peds  Hematology negative hematology ROS (+)   Anesthesia Other Findings   Reproductive/Obstetrics negative OB ROS                              Anesthesia Physical Anesthesia Plan  ASA: 3  Anesthesia Plan: MAC   Post-op Pain Management:    Induction: Intravenous  PONV Risk Score and Plan: 1 and Propofol  infusion and Treatment may vary due to age or medical condition  Airway Management Planned: Natural Airway  Additional Equipment:   Intra-op Plan:   Post-operative Plan:   Informed Consent: I have reviewed the patients History and Physical, chart, labs and discussed the procedure including the risks, benefits and alternatives for the proposed anesthesia with the patient or authorized representative who has indicated his/her understanding and acceptance.     Dental advisory given  Plan Discussed with: CRNA  Anesthesia Plan Comments:          Anesthesia Quick Evaluation

## 2023-09-16 ENCOUNTER — Encounter (HOSPITAL_COMMUNITY): Payer: Self-pay | Admitting: Gastroenterology

## 2023-09-16 ENCOUNTER — Ambulatory Visit (HOSPITAL_COMMUNITY): Payer: Self-pay

## 2023-09-16 ENCOUNTER — Ambulatory Visit (HOSPITAL_COMMUNITY)
Admission: RE | Admit: 2023-09-16 | Discharge: 2023-09-16 | Disposition: A | Discharge status: Home / Self Care | Attending: Gastroenterology | Admitting: Gastroenterology

## 2023-09-16 ENCOUNTER — Encounter (HOSPITAL_COMMUNITY)
Admission: RE | Disposition: A | Discharge status: Home / Self Care | Payer: Self-pay | Source: Home / Self Care | Attending: Gastroenterology

## 2023-09-16 ENCOUNTER — Other Ambulatory Visit: Payer: Self-pay

## 2023-09-16 DIAGNOSIS — E039 Hypothyroidism, unspecified: Secondary | ICD-10-CM | POA: Diagnosis not present

## 2023-09-16 DIAGNOSIS — K5730 Diverticulosis of large intestine without perforation or abscess without bleeding: Secondary | ICD-10-CM | POA: Insufficient documentation

## 2023-09-16 DIAGNOSIS — D122 Benign neoplasm of ascending colon: Secondary | ICD-10-CM | POA: Diagnosis not present

## 2023-09-16 DIAGNOSIS — D123 Benign neoplasm of transverse colon: Secondary | ICD-10-CM | POA: Insufficient documentation

## 2023-09-16 DIAGNOSIS — Z1211 Encounter for screening for malignant neoplasm of colon: Secondary | ICD-10-CM | POA: Insufficient documentation

## 2023-09-16 DIAGNOSIS — I10 Essential (primary) hypertension: Secondary | ICD-10-CM | POA: Diagnosis not present

## 2023-09-16 DIAGNOSIS — K635 Polyp of colon: Secondary | ICD-10-CM | POA: Diagnosis not present

## 2023-09-16 DIAGNOSIS — Z139 Encounter for screening, unspecified: Secondary | ICD-10-CM | POA: Diagnosis not present

## 2023-09-16 HISTORY — PX: POLYPECTOMY: SHX149

## 2023-09-16 HISTORY — PX: COLONOSCOPY: SHX5424

## 2023-09-16 SURGERY — COLONOSCOPY
Anesthesia: Monitor Anesthesia Care

## 2023-09-16 MED ORDER — PROPOFOL 10 MG/ML IV BOLUS
INTRAVENOUS | Status: DC | PRN
  Administered 2023-09-16: 20 ug via INTRAVENOUS
  Administered 2023-09-16: 40 ug via INTRAVENOUS
  Administered 2023-09-16 (×3): 20 ug via INTRAVENOUS

## 2023-09-16 MED ORDER — SODIUM CHLORIDE 0.9 % IV SOLN: INTRAVENOUS | Status: DC

## 2023-09-16 MED ORDER — SODIUM CHLORIDE 0.9 % IV SOLN: INTRAVENOUS | Status: DC | PRN

## 2023-09-16 MED ORDER — KETAMINE HCL 50 MG/5ML IJ SOSY
PREFILLED_SYRINGE | INTRAMUSCULAR | Status: AC
  Filled 2023-09-16: qty 5

## 2023-09-16 MED ORDER — KETAMINE HCL 10 MG/ML IJ SOLN
INTRAMUSCULAR | Status: DC | PRN
Start: 2023-09-16 — End: 2023-09-16
  Administered 2023-09-16: 30 mg via INTRAVENOUS
  Administered 2023-09-16 (×2): 10 mg via INTRAVENOUS

## 2023-09-16 MED ORDER — LIDOCAINE HCL (PF) 2 % IJ SOLN
INTRAMUSCULAR | Status: DC | PRN
  Administered 2023-09-16: 100 mg via INTRADERMAL

## 2023-09-16 MED ORDER — DEXMEDETOMIDINE HCL IN NACL 400 MCG/100ML IV SOLN
INTRAVENOUS | Status: DC | PRN
Start: 2023-09-16 — End: 2023-09-16
  Administered 2023-09-16: 4 ug via INTRAVENOUS
  Administered 2023-09-16: 8 ug via INTRAVENOUS

## 2023-09-16 MED ORDER — ONDANSETRON HCL 4 MG/2ML IJ SOLN
INTRAMUSCULAR | Status: DC | PRN
  Administered 2023-09-16: 4 mg via INTRAVENOUS

## 2023-09-16 NOTE — Transfer of Care (Signed)
 Immediate Anesthesia Transfer of Care Note  Patient: Rebecca [name]  Procedure(s) Performed: COLONOSCOPY POLYPECTOMY, INTESTINE  Patient Location: PACU  Anesthesia Type:MAC  Level of Consciousness: awake, alert , and oriented  Airway & Oxygen Therapy: Patient Spontanous Breathing  Post-op Assessment: Report given to RN  Post vital signs: Reviewed and stable  Last Vitals:  Vitals Value Taken Time  BP 118/60 09/16/23 08:00  Temp    Pulse 78 09/16/23 08:00  Resp 26 09/16/23 08:00  SpO2 96 % 09/16/23 08:00  Vitals shown include unfiled device data.  Last Pain:  Vitals:   09/16/23 0651  TempSrc: Temporal  PainSc: 0-No pain      Patients Stated Pain Goal: 0 (09/16/23 1610)  Complications: No notable events documented.

## 2023-09-16 NOTE — Anesthesia Postprocedure Evaluation (Signed)
 Anesthesia Post Note  Patient: Rebecca [name]  Procedure(s) Performed: COLONOSCOPY POLYPECTOMY, INTESTINE     Patient location during evaluation: PACU Anesthesia Type: MAC Level of consciousness: awake and alert Pain management: pain level controlled Vital Signs Assessment: post-procedure vital signs reviewed and stable Respiratory status: spontaneous breathing, nonlabored ventilation, respiratory function stable and patient connected to nasal cannula oxygen Cardiovascular status: stable and blood pressure returned to baseline Postop Assessment: no apparent nausea or vomiting Anesthetic complications: no   No notable events documented.  Last Vitals:  Vitals:   09/16/23 0810 09/16/23 0820  BP: (!) 100/51 107/81  Pulse: 69 82  Resp: (!) 23 18  Temp:    SpO2: 96% 95%    Last Pain:  Vitals:   09/16/23 0820  TempSrc:   PainSc: 0-No pain                 Lethaniel Rave

## 2023-09-16 NOTE — Discharge Instructions (Addendum)

## 2023-09-16 NOTE — Op Note (Addendum)
 [facility] Patient Name: Rebecca [name] Procedure Date: 09/16/2023 MRN: [id number] Attending MD: Alvis Jourdain , MD, [id number] Date of Birth: Aug 16, 1970 CSN: [id number] Age: 53 Admit Type: Outpatient Procedure:                Colonoscopy Indications:              Screening for colorectal malignant neoplasm Providers:                Alvis Jourdain, MD, Lonzell Robin, RN, Arlin Benes, Technician, Hershall Lory, CRNA Referring MD:              Medicines:                Propofol  per Anesthesia Complications:            No immediate complications. Estimated Blood Loss:     Estimated blood loss: none. Procedure:                Pre-Anesthesia Assessment:                           - Prior to the procedure, a History and Physical                            was performed, and patient medications and                            allergies were reviewed. The patient's tolerance of                            previous anesthesia was also reviewed. The risks                            and benefits of the procedure and the sedation                            options and risks were discussed with the patient.                            All questions were answered, and informed consent                            was obtained. Prior Anticoagulants: The patient has                            taken no anticoagulant or antiplatelet agents. ASA                            Grade Assessment: III - A patient with severe                            systemic disease. After reviewing the risks and  benefits, the patient was deemed in satisfactory                            condition to undergo the procedure.                           - Sedation was administered by an anesthesia                            professional. Deep sedation was attained.                           After obtaining informed consent, the colonoscope                             was passed under direct vision. Throughout the                            procedure, the patient's blood pressure, pulse, and                            oxygen saturations were monitored continuously. The                            [id number]-HQ190L ([id number] Olympus colonoscope was                            introduced through the anus and advanced to the the                            cecum, identified by appendiceal orifice and                            ileocecal valve. The colonoscopy was performed                            without difficulty. The patient tolerated the                            procedure well. The quality of the bowel                            preparation was evaluated using the BBPS [facility]                            Bowel Preparation Scale) with scores of: Right                            Colon = 3, Transverse Colon = 3 and Left Colon = 3                            (entire mucosa seen well with no residual staining,  small fragments of stool or opaque liquid). The                            total BBPS score equals 9. The ileocecal valve,                            appendiceal orifice, and rectum were photographed. Scope In: 7:37:59 AM Scope Out: 7:53:56 AM Scope Withdrawal Time: 0 hours 13 minutes 37 seconds  Total Procedure Duration: 0 hours 15 minutes 57 seconds  Findings:      Four sessile polyps were found in the transverse colon and ascending       colon. The polyps were 2 to 3 mm in size. These polyps were removed with       a cold snare. Resection and retrieval were complete.      Scattered large-mouthed and medium-mouthed diverticula were found in the       sigmoid colon. Impression:               - Four 2 to 3 mm polyps in the transverse colon and                            in the ascending colon, removed with a cold snare.                            Resected and retrieved.                           - Diverticulosis in the sigmoid  colon. Moderate Sedation:      Not Applicable - Patient had care per Anesthesia. Recommendation:           - Patient has a contact number available for                            emergencies. The signs and symptoms of potential                            delayed complications were discussed with the                            patient. Return to normal activities tomorrow.                            Written discharge instructions were provided to the                            patient.                           - Resume previous diet.                           - Continue present medications.                           - Await pathology results.                           -  Repeat colonoscopy in 5 years for surveillance. Procedure Code(s):        --- Professional ---                           [phone], Colonoscopy, flexible; with removal of                            tumor(s), polyp(s), or other lesion(s) by snare                            technique Diagnosis Code(s):        --- Professional ---                           Z12.11, Encounter for screening for malignant                            neoplasm of colon                           D12.3, Benign neoplasm of transverse colon (hepatic                            flexure or splenic flexure)                           D12.2, Benign neoplasm of ascending colon                           K57.30, Diverticulosis of large intestine without                            perforation or abscess without bleeding CPT copyright 2022 [employer]. All rights reserved. The codes documented in this report are preliminary and upon coder review may  be revised to meet current compliance requirements. Alvis Jourdain, MD Alvis Jourdain, MD 09/16/2023 8:06:12 AM This report has been signed electronically. Number of Addenda: 0

## 2023-09-16 NOTE — H&P (Signed)
 Rebecca [name] HPI: At this time the patient denies any problems with nausea, vomiting, fevers, chills, abdominal pain, diarrhea, constipation, melena, GERD, or dysphagia. The patient denies any known family history of colon cancers. No complaints of chest pain, SOB, MI, or sleep apnea.  Intermittently she reports minor hematochezia for the past 10 years.  Her recent CAC score was 0.  Her colonoscopy on 11/17/2007, performed for diarrheal complaints, was normal.  Past Medical History:  Diagnosis Date   Arthritis    COVID-19    History of kidney stones    Hypertension    Hypothyroidism    Thyroid  activity decreased    Thyroid  disease    Ureteral stone with hydronephrosis 11/18/2018    Past Surgical History:  Procedure Laterality Date   CARPAL TUNNEL RELEASE Right 07/24/2012   Procedure: CARPAL TUNNEL RELEASE;  Surgeon: Darrin Emerald, MD;  Location: [facility];  Service: Orthopedics;  Laterality: Right;  Right Carpal Tunnel Release   CYSTOSCOPY     x6 for stones-with stenting   CYSTOSCOPY W/ URETERAL STENT PLACEMENT Bilateral 02/10/2016   Procedure: CYSTOSCOPY WITH RETROGRADE PYELOGRAM/BILATERAL URETERAL STENT PLACEMENT;  Surgeon: Osborn Blaze, MD;  Location: [facility];  Service: Urology;  Laterality: Bilateral;   CYSTOSCOPY W/ URETERAL STENT PLACEMENT Bilateral 11/18/2018   Procedure: CYSTOSCOPY WITH RETROGRADE PYELOGRAM/URETERAL STENT PLACEMENT;  Surgeon: Osborn Blaze, MD;  Location: [facility];  Service: Urology;  Laterality: Bilateral;   CYSTOSCOPY WITH RETROGRADE PYELOGRAM, URETEROSCOPY AND STENT PLACEMENT Bilateral 11/24/2018   Procedure: CYSTOSCOPY WITH RETROGRADE PYELOGRAM, URETEROSCOPY AND STENT PLACEMENT;  Surgeon: Osborn Blaze, MD;  Location: [facility];  Service: Urology;  Laterality: Bilateral;  75 MINS   CYSTOSCOPY/URETEROSCOPY/HOLMIUM LASER/STENT PLACEMENT Bilateral 03/10/2016   Procedure: CYSTOSCOPY/BILATERAL URETEROSCOPY/ BILATERAL HOLMIUM LASER /BILATERALSTENT PLACEMENT/ BILATERAL  RETOGRADE PYLEOGRAM;  Surgeon: Osborn Blaze, MD;  Location: [facility];  Service: Urology;  Laterality: Bilateral;   DILATION AND CURETTAGE OF UTERUS     FOOT SURGERY     HOLMIUM LASER APPLICATION Bilateral 11/24/2018   Procedure: HOLMIUM LASER APPLICATION;  Surgeon: Osborn Blaze, MD;  Location: [facility];  Service: Urology;  Laterality: Bilateral;   HYSTEROSCOPY N/A 09/22/2018   Procedure: HYSTEROSCOPY W/ IUD REMOVAL AND REPLACEMENT;  Surgeon: Ozan, Jennifer, DO;  Location: [facility];  Service: Gynecology;  Laterality: N/A;   INTRAUTERINE DEVICE (IUD) INSERTION N/A 09/22/2018   Procedure: Intrauterine Device (Iud) Insertion;  Surgeon: Ozan, Jennifer, DO;  Location: [facility];  Service: Gynecology;  Laterality: N/A;   IUD REMOVAL N/A 09/22/2018   Procedure: Intrauterine Device (Iud) Removal;  Surgeon: Ozan, Jennifer, DO;  Location: [facility];  Service: Gynecology;  Laterality: N/A;   KNEE ARTHROSCOPY Left 07/08/2020   Procedure: LEFT KNEE ARTHROSCOPY;  Surgeon: Dayne Even, MD;  Location: [facility];  Service: Orthopedics;  Laterality: Left;   KNEE ARTHROSCOPY Right 11/04/2020   Procedure: RIGHT KNEE ARTHROSCOPY, CHONROPLASTY;  Surgeon: Dayne Even, MD;  Location: [facility];  Service: Orthopedics;  Laterality: Right;   STERIOD INJECTION Left 11/04/2020   Procedure: STEROID INJECTION AND ASPIRATION;  Surgeon: Dalldorf, Peter, MD;  Location: [facility];  Service: Orthopedics;  Laterality: Left;   THYROIDECTOMY     WRIST SURGERY      Family History  Problem Relation Age of Onset   Heart attack Mother    Dementia Mother    Stroke Mother    Pulmonary embolism Mother    Heart attack Father    Dementia Father    Lupus Sister    Kidney failure Sister  Stroke Maternal Grandmother    Heart attack Maternal Grandmother    Bladder Cancer Maternal Grandfather     Social History:  reports that she has never smoked. She has never been exposed to tobacco smoke. She has never used smokeless tobacco. She reports that she  does not currently use alcohol. She reports that she does not use drugs.  Allergies: No Known Allergies  Medications: Scheduled: Continuous:  sodium chloride  10 mL/hr at 09/16/23 0653    No results found for this or any previous visit (from the past 24 hours).   No results found.  ROS:  As stated above in the HPI otherwise negative.  Blood pressure (!) 141/61, temperature (!) 97.1 F (36.2 C), temperature source Temporal, resp. rate (!) 22, height 5' 5 (1.651 m), weight (!) 139.3 kg, SpO2 95%.    PE: Gen: NAD, Alert and Oriented HEENT:  [place]/AT, EOMI Neck: Supple, no LAD Lungs: CTA Bilaterally CV: RRR without M/G/R ABD: Soft, NTND, +BS Ext: No C/C/E  Assessment/Plan: 1) Screening colonoscopy.  Camren Lipsett D 09/16/2023, 7:15 AM

## 2023-09-19 ENCOUNTER — Encounter (HOSPITAL_COMMUNITY): Payer: Self-pay | Admitting: Gastroenterology

## 2023-09-19 LAB — SURGICAL PATHOLOGY

## 2023-09-21 DIAGNOSIS — E039 Hypothyroidism, unspecified: Secondary | ICD-10-CM | POA: Diagnosis not present

## 2023-09-22 DIAGNOSIS — M79642 Pain in left hand: Secondary | ICD-10-CM | POA: Diagnosis not present

## 2023-12-15 DIAGNOSIS — M542 Cervicalgia: Secondary | ICD-10-CM | POA: Diagnosis not present

## 2023-12-21 DIAGNOSIS — M542 Cervicalgia: Secondary | ICD-10-CM | POA: Diagnosis not present

## 2023-12-28 DIAGNOSIS — M542 Cervicalgia: Secondary | ICD-10-CM | POA: Diagnosis not present

## 2024-01-05 DIAGNOSIS — M545 Low back pain, unspecified: Secondary | ICD-10-CM | POA: Diagnosis not present

## 2024-01-10 DIAGNOSIS — M545 Low back pain, unspecified: Secondary | ICD-10-CM | POA: Diagnosis not present

## 2024-01-12 DIAGNOSIS — M545 Low back pain, unspecified: Secondary | ICD-10-CM | POA: Diagnosis not present

## 2024-01-17 DIAGNOSIS — M545 Low back pain, unspecified: Secondary | ICD-10-CM | POA: Diagnosis not present

## 2024-01-19 DIAGNOSIS — M545 Low back pain, unspecified: Secondary | ICD-10-CM | POA: Diagnosis not present

## 2024-01-26 DIAGNOSIS — E039 Hypothyroidism, unspecified: Secondary | ICD-10-CM | POA: Diagnosis not present

## 2024-01-26 DIAGNOSIS — I10 Essential (primary) hypertension: Secondary | ICD-10-CM | POA: Diagnosis not present

## 2024-01-26 DIAGNOSIS — E119 Type 2 diabetes mellitus without complications: Secondary | ICD-10-CM | POA: Diagnosis not present

## 2024-01-26 DIAGNOSIS — M545 Low back pain, unspecified: Secondary | ICD-10-CM | POA: Diagnosis not present

## 2024-01-26 DIAGNOSIS — F900 Attention-deficit hyperactivity disorder, predominantly inattentive type: Secondary | ICD-10-CM | POA: Diagnosis not present

## 2024-01-31 DIAGNOSIS — M545 Low back pain, unspecified: Secondary | ICD-10-CM | POA: Diagnosis not present

## 2024-02-10 DIAGNOSIS — E039 Hypothyroidism, unspecified: Secondary | ICD-10-CM | POA: Diagnosis not present

## 2024-02-10 DIAGNOSIS — E119 Type 2 diabetes mellitus without complications: Secondary | ICD-10-CM | POA: Diagnosis not present

## 2024-02-22 DIAGNOSIS — M545 Low back pain, unspecified: Secondary | ICD-10-CM | POA: Diagnosis not present

## 2024-02-23 DIAGNOSIS — M545 Low back pain, unspecified: Secondary | ICD-10-CM | POA: Diagnosis not present

## 2024-03-05 DIAGNOSIS — M545 Low back pain, unspecified: Secondary | ICD-10-CM | POA: Diagnosis not present

## 2024-03-06 DIAGNOSIS — M545 Low back pain, unspecified: Secondary | ICD-10-CM | POA: Diagnosis not present

## 2024-03-13 DIAGNOSIS — M25552 Pain in left hip: Secondary | ICD-10-CM | POA: Diagnosis not present

## 2024-03-15 DIAGNOSIS — M25552 Pain in left hip: Secondary | ICD-10-CM | POA: Diagnosis not present

## 2024-03-19 DIAGNOSIS — M25552 Pain in left hip: Secondary | ICD-10-CM | POA: Diagnosis not present
